# Patient Record
Sex: Female | Born: 1955 | Race: Black or African American | Hispanic: No | Marital: Single | Smoking: Former smoker
Health system: Southern US, Community
[De-identification: ages and names within clinical notes are randomized; demographics above are authoritative.]

## PROBLEM LIST (undated history)

## (undated) DIAGNOSIS — I739 Peripheral vascular disease, unspecified: Secondary | ICD-10-CM

## (undated) DIAGNOSIS — K559 Vascular disorder of intestine, unspecified: Secondary | ICD-10-CM

## (undated) DIAGNOSIS — I10 Essential (primary) hypertension: Secondary | ICD-10-CM

## (undated) HISTORY — DX: Peripheral vascular disease, unspecified: I73.9

## (undated) HISTORY — DX: Essential (primary) hypertension: I10

---

## 2000-06-20 ENCOUNTER — Emergency Department (HOSPITAL_COMMUNITY): Admission: EM | Admit: 2000-06-20 | Discharge: 2000-06-20 | Payer: Self-pay | Admitting: Emergency Medicine

## 2010-11-15 ENCOUNTER — Emergency Department (HOSPITAL_COMMUNITY): Admission: EM | Admit: 2010-11-15 | Payer: Self-pay | Source: Home / Self Care

## 2012-05-23 ENCOUNTER — Emergency Department (HOSPITAL_COMMUNITY)
Admission: EM | Admit: 2012-05-23 | Discharge: 2012-05-23 | Disposition: A | Discharge status: Home / Self Care | Payer: Self-pay | Attending: Emergency Medicine | Admitting: Emergency Medicine

## 2012-05-23 ENCOUNTER — Encounter (HOSPITAL_COMMUNITY): Payer: Self-pay

## 2012-05-23 DIAGNOSIS — X118XXA Contact with other hot tap-water, initial encounter: Secondary | ICD-10-CM | POA: Insufficient documentation

## 2012-05-23 DIAGNOSIS — F1010 Alcohol abuse, uncomplicated: Secondary | ICD-10-CM | POA: Insufficient documentation

## 2012-05-23 DIAGNOSIS — T2122XA Burn of second degree of abdominal wall, initial encounter: Secondary | ICD-10-CM | POA: Insufficient documentation

## 2012-05-23 DIAGNOSIS — T300 Burn of unspecified body region, unspecified degree: Secondary | ICD-10-CM

## 2012-05-23 DIAGNOSIS — F10929 Alcohol use, unspecified with intoxication, unspecified: Secondary | ICD-10-CM

## 2012-05-23 MED ORDER — SILVER SULFADIAZINE 1 % EX CREA: TOPICAL_CREAM | Freq: Every day | CUTANEOUS | Status: AC

## 2012-05-23 MED ORDER — SILVER SULFADIAZINE 1 % EX CREA
TOPICAL_CREAM | Freq: Once | CUTANEOUS | Status: AC
  Administered 2012-05-23: 22:00:00 via TOPICAL
  Filled 2012-05-23: qty 50
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
## 2012-05-23 NOTE — ED Provider Notes (Signed)
Medical screening examination/treatment/procedure(s) were performed by non-physician practitioner and as supervising physician I was immediately available for consultation/collaboration.  Tobin Chad, MD 05/23/12 [phone]
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
## 2015-10-23 ENCOUNTER — Emergency Department (INDEPENDENT_AMBULATORY_CARE_PROVIDER_SITE_OTHER)
Admission: EM | Admit: 2015-10-23 | Discharge: 2015-10-23 | Disposition: A | Discharge status: Home / Self Care | Payer: Self-pay | Source: Home / Self Care | Attending: Family Medicine | Admitting: Family Medicine

## 2015-10-23 ENCOUNTER — Encounter (HOSPITAL_COMMUNITY): Payer: Self-pay | Admitting: *Deleted

## 2015-10-23 DIAGNOSIS — I10 Essential (primary) hypertension: Secondary | ICD-10-CM

## 2015-10-23 LAB — POCT I-STAT, CHEM 8
BUN: 13 mg/dL (ref 6–20)
CHLORIDE: 105 mmol/L (ref 101–111)
CREATININE: 0.5 mg/dL (ref 0.44–1.00)
Calcium, Ion: 1.18 mmol/L (ref 1.12–1.23)
GLUCOSE: 94 mg/dL (ref 65–99)
HCT: 45 % (ref 36.0–46.0)
Hemoglobin: 15.3 g/dL — ABNORMAL HIGH (ref 12.0–15.0)
POTASSIUM: 3.7 mmol/L (ref 3.5–5.1)
Sodium: 140 mmol/L (ref 135–145)
TCO2: 23 mmol/L (ref 0–100)

## 2015-10-23 MED ORDER — LISINOPRIL-HYDROCHLOROTHIAZIDE 10-12.5 MG PO TABS: 1.0000 | ORAL_TABLET | Freq: Every day | ORAL | Status: DC

## 2015-10-23 NOTE — Discharge Instructions (Signed)
Take medicine daily and recheck bp in 2 weeks, have a check if side effect concerns and for refill of medicine.

## 2015-10-23 NOTE — ED Provider Notes (Signed)
CSN: [id number]     Arrival date & time 10/23/15  1408 History   First MD Initiated Contact with Patient 10/23/15 1544     Chief Complaint  Patient presents with  . Medication Refill   (Consider location/radiation/quality/duration/timing/severity/associated sxs/prior Treatment) Patient is a 60 y.o. female presenting with hypertension. The history is provided by the patient.  Hypertension This is a new problem. The current episode started yesterday (going to [facility] and bp has been markedly elevated since 09/2015 and has been told she needs meds, sister on meds for htn, here for eval.). The problem has been gradually worsening. Pertinent negatives include no chest pain and no abdominal pain.    History reviewed. No pertinent past medical history. History reviewed. No pertinent past surgical history. History reviewed. No pertinent family history. Social History  Substance Use Topics  . Smoking status: Current Every Day Smoker -- 1.00 packs/day for 30 years    Types: Cigarettes  . Smokeless tobacco: None  . Alcohol Use: 1.8 oz/week    3 Cans of beer per week   OB History    No data available     Review of Systems  Constitutional: Negative.   Respiratory: Negative.   Cardiovascular: Negative.  Negative for chest pain.  Gastrointestinal: Negative for abdominal pain.  All other systems reviewed and are negative.   Allergies  Review of patient's allergies indicates no known allergies.  Home Medications   Prior to Admission medications   Medication Sig Start Date End Date Taking? Authorizing Provider  lisinopril-hydrochlorothiazide (PRINZIDE,ZESTORETIC) 10-12.5 MG tablet Take 1 tablet by mouth daily. 10/23/15   Linna Hoff, MD   Meds Ordered and Administered this Visit  Medications - No data to display  BP 172/94 mmHg  Pulse 72  Temp(Src) 97.3 F (36.3 C) (Oral)  Resp 12  SpO2 97% No data found.   Physical Exam  Constitutional: She is oriented to person, place,  and time. She appears well-developed and well-nourished. No distress.  Neck: Normal range of motion. Neck supple.  Cardiovascular: Normal rate, normal heart sounds and intact distal pulses.   Pulmonary/Chest: Effort normal and breath sounds normal.  Lymphadenopathy:    She has no cervical adenopathy.  Neurological: She is alert and oriented to person, place, and time.  Skin: Skin is warm and dry.  Nursing note and vitals reviewed.   ED Course  Procedures (including critical care time)  Labs Review Labs Reviewed  POCT I-STAT, CHEM 8 - Abnormal; Notable for the following:    Hemoglobin 15.3 (*)    All other components within normal limits    Imaging Review No results found.   Visual Acuity Review  Right Eye Distance:   Left Eye Distance:   Bilateral Distance:    Right Eye Near:   Left Eye Near:    Bilateral Near:         MDM   1. Essential hypertension        Linna Hoff, MD 10/23/15 [phone]

## 2015-10-23 NOTE — ED Notes (Signed)
Pt  Reports  Has   Had     High  Blood pressure        Recently       While  She  Was  Donating plasma    Sister  Has  htn as  Well        Pt    Sitting upright  On  Exam table  Speaking in  Complete  sentances

## 2018-08-14 ENCOUNTER — Encounter (HOSPITAL_COMMUNITY)
Admission: EM | Disposition: A | Discharge status: Home / Self Care | Payer: Self-pay | Source: Home / Self Care | Attending: Vascular Surgery

## 2018-08-14 ENCOUNTER — Emergency Department (HOSPITAL_COMMUNITY): Payer: Self-pay

## 2018-08-14 ENCOUNTER — Encounter (HOSPITAL_COMMUNITY): Payer: Self-pay | Admitting: Emergency Medicine

## 2018-08-14 ENCOUNTER — Other Ambulatory Visit: Payer: Self-pay

## 2018-08-14 ENCOUNTER — Inpatient Hospital Stay (HOSPITAL_COMMUNITY)
Admission: EM | Admit: 2018-08-14 | Discharge: 2018-08-16 | DRG: 253 | Disposition: A | Discharge status: Home / Self Care | Payer: Self-pay | Attending: Vascular Surgery | Admitting: Vascular Surgery

## 2018-08-14 DIAGNOSIS — I739 Peripheral vascular disease, unspecified: Secondary | ICD-10-CM

## 2018-08-14 DIAGNOSIS — E876 Hypokalemia: Secondary | ICD-10-CM | POA: Diagnosis not present

## 2018-08-14 DIAGNOSIS — Z79899 Other long term (current) drug therapy: Secondary | ICD-10-CM

## 2018-08-14 DIAGNOSIS — I745 Embolism and thrombosis of iliac artery: Secondary | ICD-10-CM | POA: Diagnosis present

## 2018-08-14 DIAGNOSIS — Z419 Encounter for procedure for purposes other than remedying health state, unspecified: Secondary | ICD-10-CM

## 2018-08-14 DIAGNOSIS — I10 Essential (primary) hypertension: Secondary | ICD-10-CM | POA: Diagnosis present

## 2018-08-14 DIAGNOSIS — Z23 Encounter for immunization: Secondary | ICD-10-CM

## 2018-08-14 DIAGNOSIS — I70222 Atherosclerosis of native arteries of extremities with rest pain, left leg: Secondary | ICD-10-CM

## 2018-08-14 DIAGNOSIS — M79605 Pain in left leg: Secondary | ICD-10-CM

## 2018-08-14 DIAGNOSIS — F17210 Nicotine dependence, cigarettes, uncomplicated: Secondary | ICD-10-CM | POA: Diagnosis present

## 2018-08-14 HISTORY — PX: LOWER EXTREMITY ANGIOGRAPHY: CATH118251

## 2018-08-14 LAB — CBC WITH DIFFERENTIAL/PLATELET
ABS IMMATURE GRANULOCYTES: 0.04 10*3/uL (ref 0.00–0.07)
BASOS PCT: 1 %
Basophils Absolute: 0.1 10*3/uL (ref 0.0–0.1)
Eosinophils Absolute: 0 10*3/uL (ref 0.0–0.5)
Eosinophils Relative: 0 %
HCT: 41.3 % (ref 36.0–46.0)
Hemoglobin: 13.4 g/dL (ref 12.0–15.0)
IMMATURE GRANULOCYTES: 0 %
LYMPHS PCT: 23 %
Lymphs Abs: 2.2 10*3/uL (ref 0.7–4.0)
MCH: 33.3 pg (ref 26.0–34.0)
MCHC: 32.4 g/dL (ref 30.0–36.0)
MCV: 102.7 fL — AB (ref 80.0–100.0)
MONO ABS: 0.6 10*3/uL (ref 0.1–1.0)
MONOS PCT: 7 %
NEUTROS ABS: 6.5 10*3/uL (ref 1.7–7.7)
Neutrophils Relative %: 69 %
PLATELETS: 262 10*3/uL (ref 150–400)
RBC: 4.02 MIL/uL (ref 3.87–5.11)
RDW: 12.6 % (ref 11.5–15.5)
WBC: 9.5 10*3/uL (ref 4.0–10.5)
nRBC: 0 % (ref 0.0–0.2)

## 2018-08-14 LAB — COMPREHENSIVE METABOLIC PANEL
ALBUMIN: 3.8 g/dL (ref 3.5–5.0)
ALT: 17 U/L (ref 0–44)
ANION GAP: 13 (ref 5–15)
AST: 31 U/L (ref 15–41)
Alkaline Phosphatase: 70 U/L (ref 38–126)
BILIRUBIN TOTAL: 0.6 mg/dL (ref 0.3–1.2)
BUN: 25 mg/dL — AB (ref 8–23)
CHLORIDE: 95 mmol/L — AB (ref 98–111)
CO2: 25 mmol/L (ref 22–32)
Calcium: 9.3 mg/dL (ref 8.9–10.3)
Creatinine, Ser: 1.08 mg/dL — ABNORMAL HIGH (ref 0.44–1.00)
GFR calc Af Amer: >60 mL/min (ref >60)
GFR calc non Af Amer: 54 mL/min — ABNORMAL LOW (ref >60)
GLUCOSE: 96 mg/dL (ref 70–99)
POTASSIUM: 4 mmol/L (ref 3.5–5.1)
SODIUM: 133 mmol/L — AB (ref 135–145)
TOTAL PROTEIN: 7.1 g/dL (ref 6.5–8.1)

## 2018-08-14 LAB — I-STAT CG4 LACTIC ACID, ED: LACTIC ACID, VENOUS: 1.64 mmol/L (ref 0.5–1.9)

## 2018-08-14 SURGERY — LOWER EXTREMITY ANGIOGRAPHY
Anesthesia: LOCAL

## 2018-08-14 MED ORDER — LABETALOL HCL 5 MG/ML IV SOLN
INTRAVENOUS | Status: AC
  Filled 2018-08-14: qty 4

## 2018-08-14 MED ORDER — IOPAMIDOL (ISOVUE-370) INJECTION 76%
100.0000 mL | Freq: Once | INTRAVENOUS | Status: AC | PRN
  Administered 2018-08-14: 100 mL via INTRAVENOUS

## 2018-08-14 MED ORDER — SODIUM CHLORIDE 0.9 % IV SOLN: 250.0000 mL | INTRAVENOUS | Status: DC | PRN

## 2018-08-14 MED ORDER — FENTANYL CITRATE (PF) 100 MCG/2ML IJ SOLN
INTRAMUSCULAR | Status: DC | PRN
  Administered 2018-08-14: 50 ug via INTRAVENOUS

## 2018-08-14 MED ORDER — HEPARIN (PORCINE) IN NACL 1000-0.9 UT/500ML-% IV SOLN
INTRAVENOUS | Status: AC
  Filled 2018-08-14: qty 500

## 2018-08-14 MED ORDER — FENTANYL CITRATE (PF) 100 MCG/2ML IJ SOLN
INTRAMUSCULAR | Status: AC
  Filled 2018-08-14: qty 2

## 2018-08-14 MED ORDER — LABETALOL HCL 5 MG/ML IV SOLN
10.0000 mg | INTRAVENOUS | Status: DC | PRN
  Administered 2018-08-14 (×2): 10 mg via INTRAVENOUS

## 2018-08-14 MED ORDER — ACETAMINOPHEN 325 MG PO TABS: 650.0000 mg | ORAL_TABLET | ORAL | Status: DC | PRN

## 2018-08-14 MED ORDER — MORPHINE SULFATE (PF) 2 MG/ML IV SOLN
INTRAVENOUS | Status: AC
  Filled 2018-08-14: qty 1

## 2018-08-14 MED ORDER — ASPIRIN EC 81 MG PO TBEC
81.0000 mg | DELAYED_RELEASE_TABLET | Freq: Every day | ORAL | Status: DC
  Administered 2018-08-14 – 2018-08-16 (×3): 81 mg via ORAL
  Filled 2018-08-14 (×3): qty 1

## 2018-08-14 MED ORDER — MORPHINE SULFATE (PF) 10 MG/ML IV SOLN
2.0000 mg | INTRAVENOUS | Status: DC | PRN
  Administered 2018-08-14: 2 mg via INTRAVENOUS

## 2018-08-14 MED ORDER — FENTANYL CITRATE (PF) 100 MCG/2ML IJ SOLN
50.0000 ug | INTRAMUSCULAR | Status: AC | PRN
  Administered 2018-08-14 – 2018-08-15 (×4): 50 ug via INTRAVENOUS
  Filled 2018-08-14 (×4): qty 2

## 2018-08-14 MED ORDER — MORPHINE SULFATE (PF) 2 MG/ML IV SOLN: 2.0000 mg | INTRAVENOUS | Status: DC | PRN

## 2018-08-14 MED ORDER — IODIXANOL 320 MG/ML IV SOLN
INTRAVENOUS | Status: DC | PRN
  Administered 2018-08-14: 97 mL via INTRA_ARTERIAL

## 2018-08-14 MED ORDER — SODIUM CHLORIDE 0.9% FLUSH
3.0000 mL | Freq: Two times a day (BID) | INTRAVENOUS | Status: DC
  Administered 2018-08-14 – 2018-08-15 (×2): 3 mL via INTRAVENOUS

## 2018-08-14 MED ORDER — FENTANYL CITRATE (PF) 100 MCG/2ML IJ SOLN
50.0000 ug | Freq: Once | INTRAMUSCULAR | Status: AC
  Administered 2018-08-14: 50 ug via INTRAVENOUS
  Filled 2018-08-14: qty 2

## 2018-08-14 MED ORDER — HYDRALAZINE HCL 20 MG/ML IJ SOLN
INTRAMUSCULAR | Status: AC
  Filled 2018-08-14: qty 1

## 2018-08-14 MED ORDER — LISINOPRIL 10 MG PO TABS
10.0000 mg | ORAL_TABLET | Freq: Every day | ORAL | Status: DC
  Administered 2018-08-14 – 2018-08-16 (×3): 10 mg via ORAL
  Filled 2018-08-14 (×3): qty 1

## 2018-08-14 MED ORDER — LIDOCAINE HCL (PF) 1 % IJ SOLN
INTRAMUSCULAR | Status: DC | PRN
  Administered 2018-08-14: 15 mL via INTRADERMAL

## 2018-08-14 MED ORDER — HYDROCHLOROTHIAZIDE 12.5 MG PO CAPS
12.5000 mg | ORAL_CAPSULE | Freq: Every day | ORAL | Status: DC
  Administered 2018-08-14 – 2018-08-16 (×3): 12.5 mg via ORAL
  Filled 2018-08-14 (×3): qty 1

## 2018-08-14 MED ORDER — OXYCODONE HCL 5 MG PO TABS
ORAL_TABLET | ORAL | Status: AC
  Filled 2018-08-14: qty 2

## 2018-08-14 MED ORDER — LISINOPRIL-HYDROCHLOROTHIAZIDE 10-12.5 MG PO TABS: 1.0000 | ORAL_TABLET | Freq: Every day | ORAL | Status: DC

## 2018-08-14 MED ORDER — HEPARIN (PORCINE) IN NACL 1000-0.9 UT/500ML-% IV SOLN
INTRAVENOUS | Status: DC | PRN
  Administered 2018-08-14 (×2): 500 mL

## 2018-08-14 MED ORDER — ONDANSETRON HCL 4 MG/2ML IJ SOLN: 4.0000 mg | Freq: Four times a day (QID) | INTRAMUSCULAR | Status: DC | PRN

## 2018-08-14 MED ORDER — SODIUM CHLORIDE 0.9 % IV SOLN
INTRAVENOUS | Status: DC
  Administered 2018-08-14: 12:00:00 via INTRAVENOUS

## 2018-08-14 MED ORDER — LIDOCAINE HCL (PF) 1 % IJ SOLN
INTRAMUSCULAR | Status: AC
  Filled 2018-08-14: qty 30

## 2018-08-14 MED ORDER — MORPHINE SULFATE (PF) 2 MG/ML IV SOLN
2.0000 mg | Freq: Once | INTRAVENOUS | Status: AC
  Administered 2018-08-14: 2 mg via INTRAVENOUS

## 2018-08-14 MED ORDER — MIDAZOLAM HCL 2 MG/2ML IJ SOLN
INTRAMUSCULAR | Status: AC
  Filled 2018-08-14: qty 2

## 2018-08-14 MED ORDER — IOPAMIDOL (ISOVUE-370) INJECTION 76%
INTRAVENOUS | Status: AC
  Filled 2018-08-14: qty 100

## 2018-08-14 MED ORDER — SODIUM CHLORIDE 0.9 % WEIGHT BASED INFUSION: 1.0000 mL/kg/h | INTRAVENOUS | Status: DC

## 2018-08-14 MED ORDER — CEFAZOLIN SODIUM-DEXTROSE 2-4 GM/100ML-% IV SOLN
2.0000 g | INTRAVENOUS | Status: AC
  Administered 2018-08-15: 2 g via INTRAVENOUS
  Filled 2018-08-14: qty 100

## 2018-08-14 MED ORDER — HEPARIN SODIUM (PORCINE) 5000 UNIT/ML IJ SOLN
5000.0000 [IU] | Freq: Three times a day (TID) | INTRAMUSCULAR | Status: DC
  Administered 2018-08-14 – 2018-08-16 (×4): 5000 [IU] via SUBCUTANEOUS
  Filled 2018-08-14 (×5): qty 1

## 2018-08-14 MED ORDER — SODIUM CHLORIDE 0.9% FLUSH: 3.0000 mL | INTRAVENOUS | Status: DC | PRN

## 2018-08-14 MED ORDER — OXYCODONE HCL 5 MG PO TABS
5.0000 mg | ORAL_TABLET | ORAL | Status: DC | PRN
  Administered 2018-08-14 – 2018-08-16 (×5): 10 mg via ORAL
  Filled 2018-08-14 (×4): qty 2

## 2018-08-14 MED ORDER — ROSUVASTATIN CALCIUM 5 MG PO TABS
10.0000 mg | ORAL_TABLET | Freq: Every day | ORAL | Status: DC
  Administered 2018-08-14 – 2018-08-15 (×2): 10 mg via ORAL
  Filled 2018-08-14 (×3): qty 2

## 2018-08-14 MED ORDER — MIDAZOLAM HCL 2 MG/2ML IJ SOLN
INTRAMUSCULAR | Status: DC | PRN
  Administered 2018-08-14: 1 mg via INTRAVENOUS

## 2018-08-14 MED ORDER — HYDRALAZINE HCL 20 MG/ML IJ SOLN
5.0000 mg | INTRAMUSCULAR | Status: DC | PRN
  Administered 2018-08-14: 5 mg via INTRAVENOUS

## 2018-08-14 SURGICAL SUPPLY — 9 items
CATH OMNI FLUSH 5F 65CM (CATHETERS) ×1 IMPLANT
KIT MICROPUNCTURE NIT STIFF (SHEATH) ×1 IMPLANT
KIT PV (KITS) ×3 IMPLANT
SHEATH PINNACLE 5F 10CM (SHEATH) ×1 IMPLANT
SHEATH PROBE COVER 6X72 (BAG) ×1 IMPLANT
SYR MEDRAD MARK V 150ML (SYRINGE) ×3 IMPLANT
TRANSDUCER W/STOPCOCK (MISCELLANEOUS) ×3 IMPLANT
TRAY PV CATH (CUSTOM PROCEDURE TRAY) ×3 IMPLANT
WIRE BENTSON .035X145CM (WIRE) ×1 IMPLANT

## 2018-08-14 NOTE — ED Provider Notes (Signed)
[facility] EMERGENCY DEPARTMENT Provider Note   CSN: [id number] Arrival date & time: 08/14/18  0055     History   Chief Complaint Chief Complaint  Patient presents with  . Leg Pain    HPI Danielle Morton is a 62 y.o. female.  The history is provided by the patient.  She has history of hypertension and comes in complaining of pain in her left leg for the last week.  Pain is getting worse and has gotten to the point where she is having difficulty walking.  She rates pain at 7/10.  She has tried taking Goody powder without relief.  She is having difficulty sleeping because of pain.  She denies any trauma.  Pain starts at her left knee and goes all the way down into her foot but does not radiate proximally.  There is no hip or back pain.  She denies any trauma.  Pain is described as a deep ache.  History reviewed. No pertinent past medical history.  There are no active problems to display for this patient.   History reviewed. No pertinent surgical history.   OB History   None      Home Medications    Prior to Admission medications   Medication Sig Start Date End Date Taking? Authorizing Provider  lisinopril-hydrochlorothiazide (PRINZIDE,ZESTORETIC) 10-12.5 MG tablet Take 1 tablet by mouth daily. 10/23/15   Linna Hoff, MD    Family History No family history on file.  Social History Social History   Tobacco Use  . Smoking status: Current Every Day Smoker    Packs/day: 1.00    Years: 30.00    Pack years: 30.00    Types: Cigarettes  . Smokeless tobacco: Never Used  Substance Use Topics  . Alcohol use: Yes    Alcohol/week: 3.0 standard drinks    Types: 3 Cans of beer per week  . Drug use: No     Allergies   Patient has no known allergies.   Review of Systems Review of Systems  All other systems reviewed and are negative.    Physical Exam Updated Vital Signs BP (!) 172/81 (BP Location: Right Arm)   Pulse (!) 49   Temp 98.5 F (36.9  C) (Oral)   Resp 18   Ht 5\' 5"  (1.651 m)   SpO2 98%   Physical Exam  Nursing note and vitals reviewed.  62 year old female, resting comfortably and in no acute distress. Vital signs are significant for elevated systolic blood pressure and slow pulse. Oxygen saturation is 98%, which is normal. Head is normocephalic and atraumatic. PERRLA, EOMI. Oropharynx is clear. Neck is nontender and supple without adenopathy or JVD. Back is nontender and there is no CVA tenderness.  Straight leg raise is negative. Lungs are clear without rales, wheezes, or rhonchi. Chest is nontender. Heart has regular rate and rhythm without murmur. Abdomen is soft, flat, nontender without masses or hepatosplenomegaly and peristalsis is normoactive. Extremities: Left leg is cold from the knee, distally.  There is mild tenderness to palpation in the left calf but none in the foot.  Dorsalis pedis and posterior tibial pulses are not palpable.  Capillary refill is delayed to 6 seconds.  Toes appears somewhat cyanotic, but also appear mildly cyanotic on the right.  Capillary refill on the right is also delayed to 5 seconds.  Dorsalis pedis and posterior tibial pulses are not palpable on the right, either.  Pulse was checked with the aid of Doppler and I  was able to detect a dorsalis pedis pulse on the right, none on the left.. Skin is warm and dry without rash. Neurologic: Mental status is normal, cranial nerves are intact, there are no motor or sensory deficits.  ED Treatments / Results  Labs (all labs ordered are listed, but only abnormal results are displayed) Labs Reviewed  COMPREHENSIVE METABOLIC PANEL - Abnormal; Notable for the following components:      Result Value   Sodium 133 (*)    Chloride 95 (*)    BUN 25 (*)    Creatinine, Ser 1.08 (*)    GFR calc non Af Amer 54 (*)    All other components within normal limits  CBC WITH DIFFERENTIAL/PLATELET - Abnormal; Notable for the following components:   MCV  102.7 (*)    All other components within normal limits  I-STAT CG4 LACTIC ACID, ED    EKG EKG Interpretation  Date/Time:  Monday August 14 2018 02:28:58 EST Ventricular Rate:  51 PR Interval:    QRS Duration: 96 QT Interval:  470 QTC Calculation: 433 R Axis:   39 Text Interpretation:  Sinus bradycardia Otherwise within normal limits No old tracing to compare Confirmed by Dione Booze ([id number]) on 08/14/2018 2:36:39 AM   Radiology Ct Angio Low Extrem Left W &/or Wo Contrast  Result Date: 08/14/2018 CLINICAL DATA:  Sudden onset of cold left leg.  Pain. EXAM: CT ANGIOGRAPHY OF LEFT LOWER EXTREMITY TECHNIQUE: Multidetector CT imaging of the abdomen, pelvis and left lower extremity was performed using the standard protocol during bolus administration of intravenous contrast. Multiplanar CT image reconstructions and MIPs were obtained to evaluate the vascular anatomy. CONTRAST:  ISOVUE-370 IOPAMIDOL (ISOVUE-370) INJECTION 76% COMPARISON:  None. FINDINGS: VASCULAR Aorta: Infrarenal ectasia, maximal dimension 2.9 cm. Calcified plaque with eccentric mural thrombus causing approximately 50% luminal stenosis just proximal to the IMA. No aortic dissection or periaortic stranding. Celiac: Plaque at the origin causes approximately 50% stenosis. No poststenotic dilatation. No dissection. SMA: Patent with minimal calcified and noncalcified plaque in the midportion. Noncalcified plaque causes mild luminal narrowing just proximal to bifurcation of mesenteric branch vessels. Renals: Mild plaque within the proximal right renal artery causes less than 50% stenosis. The left renal artery is patent. IMA: Occluded at the origin with proximal reconstitution from collaterals. RIGHT Lower Extremity Inflow: Moderate calcified noncalcified plaque of the common and included internal and external iliac arteries. High-grade stenosis of the mid external iliac artery, distal portion not included in the field of view on  this left lower extremity exam. Outflow: Not included in the field of view. Runoff: Not included in the field of view. LEFT Lower Extremity Inflow: Calcified and noncalcified plaque with eccentric mural thrombus causing high-grade greater than 75% stenosis at the left common iliac artery. The internal iliac artery is occluded at the origin with thready distal reconstitution. Mild stenosis at the origin of the external iliac artery with calcified and noncalcified plaque. Outflow: Common femoral artery is occluded just beyond the inguinal ligament spanning approximately 19 mm. There is collateralized flow in the profound femoral artery, which provides dominant blood supply to the lower extremity. Minimal flow in the proximal superficial femoral artery which is otherwise completely occluded from the proximal portion through the popliteal. Collaterals provide minimal distal reconstitution flow in the popliteal artery which is small in caliber. Runoff: Faint 2 vessel runoff through the ankle with flow in the peroneal and anterior tibial artery. Minimal flow in the proximal posterior tibial artery which is  otherwise not visualized. Calf vessels are small in caliber. Veins: Not well assessed on this dedicated arterial phase exam. Review of the MIP images confirms the above findings. NON-VASCULAR Lower chest: Mild cardiomegaly.  Suspected emphysema at the bases. Hepatobiliary: Included portion of the liver is unremarkable. Pancreas: No ductal dilatation or inflammation. Spleen: Normal in size without focal abnormality. Adrenals/Urinary Tract: No adrenal nodule. Right kidney are partially included in the field of view. Left kidney is unremarkable. Urinary bladder is distended without wall thickening. Stomach/Bowel: Descending and sigmoid colon are nondistended and not well assessed. No evidence of bowel inflammation or obstruction, right bowel loops not included in the field of view. Lymphatic: No adenopathy. Reproductive:  Calcified uterine fibroid.  No adnexal mass. Other: No free fluid or free air in the included abdomen or pelvis. Musculoskeletal: Multiple bone infarcts in the distal femur and throughout the tibia. IMPRESSION: VASCULAR 1. Severe vascular disease of the abdominal aorta and left lower extremity. The left superficial femoral artery is near completely occluded throughout its course with minimal flow proximally, some thready distal reconstitution at the popliteal. Profundus femoris is the dominant blood supply to the left lower extremity via collaterals. Thready 2 vessel runoff to the foot with small caliber vessels in the calf. 2. 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 Mild narrowing at the origin of the left internal iliac artery with calcified plaque, which is otherwise patent. Left external iliac artery is occluded at the origin with only minimal distal reconstitution. 3. Ectatic infrarenal aorta with diffuse calcified and noncalcified plaque and large amount of eccentric mural thrombus. 4. Recommend vascular surgery consultation. NON-VASCULAR Nonacute findings as described above. These results were called by telephone at the time of interpretation on 08/14/2018 at 4:16 am to Dr. Dione BoozeAVID Othman Masur , who verbally acknowledged these results. Electronically Signed   By: Narda RutherfordMelanie  Sanford M.D.   On: 08/14/2018 04:23    Procedures Procedures  CRITICAL CARE Performed by: Dione Boozeavid Shi Blankenship Total critical care time: 50 minutes Critical care time was exclusive of separately billable procedures and treating other patients. Critical care was necessary to treat or prevent imminent or life-threatening deterioration. Critical care was time spent personally by me on the following activities: development of treatment plan with patient and/or surrogate as well as nursing, discussions with consultants, evaluation of patient's response to treatment,  examination of patient, obtaining history from patient or surrogate, ordering and performing treatments and interventions, ordering and review of laboratory studies, ordering and review of radiographic studies, pulse oximetry and re-evaluation of patient's condition.  Medications Ordered in ED Medications  iopamidol (ISOVUE-370) 76 % injection (has no administration in time range)  fentaNYL (SUBLIMAZE) injection 50 mcg (50 mcg Intravenous Given 08/14/18 0229)  iopamidol (ISOVUE-370) 76 % injection 100 mL (100 mLs Intravenous Contrast Given 08/14/18 0257)     Initial Impression / Assessment and Plan / ED Course  I have reviewed the triage vital signs and the nursing notes.  Pertinent labs & imaging results that were available during my care of the patient were reviewed by me and considered in my medical decision making (see chart for details).  Left leg pain which appears to be due to peripheral vascular disease.  Unable to detect pulses on the left foot with aid of Doppler.  Old records are reviewed, and she has no relevant past visits.  She will be sent for CT angiogram of the left leg.  CT angiogram  confirms severe peripheral vascular disease.  Lactic acid level is normal.  Case is discussed with Dr. Myra Gianotti of vascular surgery service who agrees to come to evaluate the patient.  Final Clinical Impressions(s) / ED Diagnoses   Final diagnoses:  Peripheral arterial disease (HCC)  Left leg pain    ED Discharge Orders    None       Dione Booze, MD 08/14/18 [phone]

## 2018-08-14 NOTE — ED Triage Notes (Signed)
[facility] [employer] [facility] (pt is [facility]) for c/o left leg pain from her knee to her toes described as pins and needles. Hx: HTN currently taking lisinopril. BP 160/100 P60 RR16 CBG 124

## 2018-08-14 NOTE — Progress Notes (Signed)
Sleeping

## 2018-08-14 NOTE — ED Notes (Signed)
Vascular surgery @ bedside

## 2018-08-14 NOTE — Consult Note (Signed)
[facility] [place]  Patient name: Danielle Morton MRN: [id number] DOB: September 14, 1956 Sex: female   REQUESTING PROVIDER:    ER   REASON FOR CONSULT:    Left leg pain  HISTORY OF PRESENT ILLNESS:   Danielle Morton is a 62 y.o. female, who presented to the emergency department today with a one-week history of left leg pain.  It has progressed to where she can hardly take a few steps before it starts to hurt.  She is tried taking Goody powder for pain relief which has been unsuccessful.  The pain wakes her up at night.  She does not have any open wounds.  The patient is a smoker and managed for hypertension.  PAST MEDICAL HISTORY   Hypertension Tobacco abuse Peripheral vascular disease   FAMILY HISTORY   Negative for premature cardiovascular disease  SOCIAL HISTORY:   Social History   Socioeconomic History  . Marital status: Single    Spouse name: Not on file  . Number of children: Not on file  . Years of education: Not on file  . Highest education level: Not on file  Occupational History  . Not on file  Social Needs  . Financial resource strain: Not on file  . Food insecurity:    Worry: Not on file    Inability: Not on file  . Transportation needs:    Medical: Not on file    Non-medical: Not on file  Tobacco Use  . Smoking status: Current Every Day Smoker    Packs/day: 1.00    Years: 30.00    Pack years: 30.00    Types: Cigarettes  . Smokeless tobacco: Never Used  Substance and Sexual Activity  . Alcohol use: Yes    Alcohol/week: 3.0 standard drinks    Types: 3 Cans of beer per week  . Drug use: No  . Sexual activity: Not on file  Lifestyle  . Physical activity:    Days per week: Not on file    Minutes per session: Not on file  . Stress: Not on file  Relationships  . Social connections:    Talks on phone: Not on file    Gets together: Not on file    Attends religious service: Not on file    Active  member of club or organization: Not on file    Attends meetings of clubs or organizations: Not on file    Relationship status: Not on file  . Intimate partner violence:    Fear of current or ex partner: Not on file    Emotionally abused: Not on file    Physically abused: Not on file    Forced sexual activity: Not on file  Other Topics Concern  . Not on file  Social History Narrative  . Not on file    ALLERGIES:    No Known Allergies  CURRENT MEDICATIONS:    Current Facility-Administered Medications  Medication Dose Route Frequency Provider Last Rate Last Dose  . iopamidol (ISOVUE-370) 76 % injection            Current Outpatient Medications  Medication Sig Dispense Refill  . Aspirin-Acetaminophen (GOODYS BODY PAIN PO) Take 1 packet by mouth daily as needed (pain).    Marland Kitchen. lisinopril-hydrochlorothiazide (PRINZIDE,ZESTORETIC) 10-12.5 MG tablet Take 1 tablet by mouth daily. 30 tablet 1    REVIEW OF SYSTEMS:   [X]  denotes positive finding, [ ]  denotes negative finding Cardiac  Comments:  Chest pain or chest pressure:  Shortness of breath upon exertion:    Short of breath when lying flat:    Irregular heart rhythm:        Vascular    Pain in calf, thigh, or hip brought on by ambulation: x   Pain in feet at night that wakes you up from your sleep:  x   Blood clot in your veins:    Leg swelling:         Pulmonary    Oxygen at home:    Productive cough:     Wheezing:         Neurologic    Sudden weakness in arms or legs:     Sudden numbness in arms or legs:     Sudden onset of difficulty speaking or slurred speech:    Temporary loss of vision in one eye:     Problems with dizziness:         Gastrointestinal    Blood in stool:      Vomited blood:         Genitourinary    Burning when urinating:     Blood in urine:        Psychiatric    Major depression:         Hematologic    Bleeding problems:    Problems with blood clotting too easily:        Skin      Rashes or ulcers:        Constitutional    Fever or chills:     PHYSICAL EXAM:   Vitals:   08/14/18 0530 08/14/18 0600 08/14/18 0630 08/14/18 0730  BP: 128/79 112/77 (!) 144/97 132/73  Pulse: (!) 51 (!) 52 (!) 53 71  Resp: 20 20 19 18   Temp:      TempSrc:      SpO2: 99% 95% 96% 97%  Height:        GENERAL: The patient is a well-nourished female, in no acute distress. The vital signs are documented above. CARDIAC: There is a regular rate and rhythm.  VASCULAR: Palpable femoral pulses.  Monophasic Doppler signal in the posterior tibial and anterior tibial artery on the right.  No audible Doppler signals in the left PULMONARY: Nonlabored respirations ABDOMEN: Soft and non-tender with normal pitched bowel sounds.  MUSCULOSKELETAL: There are no major deformities or cyanosis. NEUROLOGIC: Sensation and motor function are diminished on the left sKIN: There are no ulcers or rashes noted. PSYCHIATRIC: The patient has a normal affect.  STUDIES:   I have reviewed her CT scan with the following findings: 1. Severe vascular disease of the abdominal aorta and left lower extremity. The left superficial femoral artery is near completely occluded throughout its course with minimal flow proximally, some thready distal reconstitution at the popliteal. Profundus femoris is the dominant blood supply to the left lower extremity via collaterals. Thready 2 vessel runoff to the foot with small caliber vessels in the calf. 2. 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 Mild narrowing at the origin of the left internal iliac artery with calcified plaque, which is otherwise patent. Left external iliac artery is occluded at the origin with only minimal distal reconstitution. 3. Ectatic infrarenal aorta with diffuse calcified and noncalcified plaque and large amount of eccentric mural thrombus. 4. Recommend vascular  surgery consultation.  ASSESSMENT and PLAN   Severe lower extremity peripheral vascular disease, bordering on left leg rest pain: The  patient has a CT scan that shows a high-grade stenosis within the left common iliac artery.  There is also a left superficial femoral artery occlusion.  I told the patient that this likely explains her symptoms.  Because of the severity of her pain, I recommended that we proceed with angiography today.  This would be through a right femoral approach, possibly bilateral, with plans for intervention on the left leg.  The risk-benefit of the procedure discussed with the patient.  She will be n.p.o. until the procedure.  She potentially could go home following her procedure.   Durene Cal, MD [facility] [phone] Pager [phone]

## 2018-08-14 NOTE — Progress Notes (Addendum)
Site area: rt groin fa sheath Site Prior to Removal:  Level 0 Pressure Applied For:  20 minutes Manual:   yes Patient Status During Pull:  stable Post Pull Site:  Level 0 Post Pull Instructions Given:  yes Post Pull Pulses Present: rt pt dopplered Dressing Applied:  Gauze and tegaderm Bedrest begins @ 1435 Comments: Knee immobilizer placed on rt leg

## 2018-08-14 NOTE — Progress Notes (Signed)
Continues to sleep. No c/o pain

## 2018-08-14 NOTE — Progress Notes (Signed)
Orthopedic Tech Progress Note Patient Details:  Danielle GleeSharon Morton [birth year] [id number]  Ortho Devices Type of Ortho Device: Knee Immobilizer       [name] 08/14/2018, 3:48 PM

## 2018-08-14 NOTE — Anesthesia Preprocedure Evaluation (Addendum)
Anesthesia Evaluation  Patient identified by MRN, date of birth, ID band Patient awake    Reviewed: Allergy & Precautions, NPO status , Patient's Chart, lab work & pertinent test results  Airway Mallampati: II  TM Distance: >3 FB Neck ROM: Full    Dental  (+) Lower Dentures, Upper Dentures   Pulmonary Current Smoker,    Pulmonary exam normal breath sounds clear to auscultation       Cardiovascular hypertension, Pt. on medications + Peripheral Vascular Disease  Normal cardiovascular exam Rhythm:Regular Rate:Normal  ECG: SR, rate 51   Neuro/Psych negative neurological ROS  negative psych ROS   GI/Hepatic negative GI ROS, Neg liver ROS,   Endo/Other  diabetes  Renal/GU negative Renal ROS     Musculoskeletal negative musculoskeletal ROS (+)   Abdominal   Peds  Hematology negative hematology ROS (+)   Anesthesia Other Findings femoral occlusion  Reproductive/Obstetrics                            Anesthesia Physical Anesthesia Plan  ASA: III  Anesthesia Plan: General   Post-op Pain Management:    Induction: Intravenous  PONV Risk Score and Plan: 2 and Ondansetron, Dexamethasone, Midazolam and Treatment may vary due to age or medical condition  Airway Management Planned: Oral ETT  Additional Equipment:   Intra-op Plan:   Post-operative Plan: Extubation in OR  Informed Consent: I have reviewed the patients History and Physical, chart, labs and discussed the procedure including the risks, benefits and alternatives for the proposed anesthesia with the patient or authorized representative who has indicated his/her understanding and acceptance.   Dental advisory given  Plan Discussed with: CRNA  Anesthesia Plan Comments:        Anesthesia Quick Evaluation

## 2018-08-14 NOTE — ED Notes (Signed)
Patient is resting comfortably. 

## 2018-08-14 NOTE — Progress Notes (Signed)
Patient sitting up, bending rt leg, c/o pain in left leg, screaming at times. Dr. Randie Heinzain made aware during sheath pull

## 2018-08-14 NOTE — H&P (Signed)
   History and Physical Update  The patient was interviewed and re-examined.  The patient's previous History and Physical has been reviewed and is unchanged from Dr. Myra GianottiBrabham evaluation. There are no appreciable signals on the left with adequate pt and peroneal on the right. Plan for aortogram with possible left leg intervention.  Spenser Cong C. Randie Heinzain, MD [facility] [place] Office: [phone] Pager: [phone]   08/14/2018, 1:03 PM

## 2018-08-14 NOTE — Plan of Care (Signed)
  Problem: Education: Goal: Knowledge of General Education information will improve Description Including pain rating scale, medication(s)/side effects and non-pharmacologic comfort measures Outcome: Progressing   

## 2018-08-14 NOTE — ED Notes (Signed)
Pt is resting no signs of distress

## 2018-08-14 NOTE — Op Note (Signed)
    Patient name: Danielle GleeSharon Rolison MRN: [id number] DOB: 07/19/1956 Sex: female  08/14/2018 Pre-operative Diagnosis: Critical left lower extremity ischemia with rest pain Post-operative diagnosis:  Same Surgeon:  Luanna SalkBrandon C. Randie Heinzain, MD Procedure Performed: 1.  Ultrasound-guided cannulation right common femoral artery 2.  Aortogram bilateral lower extremity runoff 3.  Moderate sedation with fentanyl and Versed for 25 minutes  Indications: 62 year old female with out significant previous medical history presents for evaluation of left lower extremity rest pain and numbness.  CT scan demonstrated occlusion of SFA and tight stenosis common femoral artery and common iliac artery proximally on the left.  She is indicated for angiogram possible intervention.  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  Left common iliac artery has greater than 70% stenosis and the hypogastric artery is occluded.  The right hypogastric artery appears patent without flow-limiting stenosis on the right common or external iliac arteries.  The left common femoral artery is occluded the profunda reconstitutes shortly thereafter.  Bilaterally she has SFA occlusions and reconstituted at the knee popliteal artery appears to have three-vessel runoff although this is incompletely evaluated.   Procedure:  The patient was identified in the holding area and taken to room 8.  The patient was then placed supine on the table and prepped and draped in the usual sterile fashion.  A time out was called.  Ultrasound was used to evaluate the right common femoral artery which was noted to be patent.  The area was anesthetized 1% lidocaine and image was saved the permanent record.  This was cannulated directly micropuncture needle followed by wire and sheath.  A Bentson wire placed 12 x 5 French sheath and Omni catheter was placed to level of L1.  Aortogram was performed followed by bilateral lower extremity runoff.  With  the above findings she will be scheduled for left common femoral endarterectomy with retrograde left common iliac artery stenting.  She tolerated procedure well without immediate complication after catheter and wire removed.  Sheath will be pulled in postoperative holding.  Contrast: 97 cc   Geet Hosking C. Randie Heinzain, MD [facility] [place] Office: [phone] Pager: [phone]

## 2018-08-15 ENCOUNTER — Inpatient Hospital Stay (HOSPITAL_COMMUNITY): Payer: Self-pay

## 2018-08-15 ENCOUNTER — Inpatient Hospital Stay (HOSPITAL_COMMUNITY): Payer: Self-pay | Admitting: Anesthesiology

## 2018-08-15 ENCOUNTER — Encounter (HOSPITAL_COMMUNITY): Payer: Self-pay | Admitting: Vascular Surgery

## 2018-08-15 ENCOUNTER — Encounter (HOSPITAL_COMMUNITY)
Admission: EM | Disposition: A | Discharge status: Home / Self Care | Payer: Self-pay | Source: Home / Self Care | Attending: Vascular Surgery

## 2018-08-15 DIAGNOSIS — I739 Peripheral vascular disease, unspecified: Secondary | ICD-10-CM

## 2018-08-15 HISTORY — PX: ENDARTERECTOMY FEMORAL: SHX5804

## 2018-08-15 HISTORY — PX: INSERTION OF ILIAC STENT: SHX6256

## 2018-08-15 LAB — BASIC METABOLIC PANEL
ANION GAP: 11 (ref 5–15)
BUN: 12 mg/dL (ref 8–23)
CALCIUM: 10 mg/dL (ref 8.9–10.3)
CHLORIDE: 96 mmol/L — AB (ref 98–111)
CO2: 27 mmol/L (ref 22–32)
Creatinine, Ser: 0.91 mg/dL (ref 0.44–1.00)
GFR calc non Af Amer: >60 mL/min (ref >60)
Glucose, Bld: 93 mg/dL (ref 70–99)
Potassium: 3.5 mmol/L (ref 3.5–5.1)
Sodium: 134 mmol/L — ABNORMAL LOW (ref 135–145)

## 2018-08-15 LAB — CBC
HCT: 39.4 % (ref 36.0–46.0)
Hemoglobin: 12.7 g/dL (ref 12.0–15.0)
MCH: 32.3 pg (ref 26.0–34.0)
MCHC: 32.2 g/dL (ref 30.0–36.0)
MCV: 100.3 fL — AB (ref 80.0–100.0)
PLATELETS: 327 10*3/uL (ref 150–400)
RBC: 3.93 MIL/uL (ref 3.87–5.11)
RDW: 12.6 % (ref 11.5–15.5)
WBC: 7.3 10*3/uL (ref 4.0–10.5)
nRBC: 0 % (ref 0.0–0.2)

## 2018-08-15 SURGERY — ENDARTERECTOMY, FEMORAL
Anesthesia: General | Site: Abdomen | Laterality: Left

## 2018-08-15 MED ORDER — ROCURONIUM BROMIDE 10 MG/ML (PF) SYRINGE
PREFILLED_SYRINGE | INTRAVENOUS | Status: DC | PRN
  Administered 2018-08-15: 50 mg via INTRAVENOUS

## 2018-08-15 MED ORDER — PROTAMINE SULFATE 10 MG/ML IV SOLN
INTRAVENOUS | Status: DC | PRN
  Administered 2018-08-15: 50 mg via INTRAVENOUS

## 2018-08-15 MED ORDER — FENTANYL CITRATE (PF) 250 MCG/5ML IJ SOLN
INTRAMUSCULAR | Status: AC
  Filled 2018-08-15: qty 5

## 2018-08-15 MED ORDER — GLYCOPYRROLATE PF 0.2 MG/ML IJ SOSY
PREFILLED_SYRINGE | INTRAMUSCULAR | Status: DC | PRN
  Administered 2018-08-15 (×2): .1 mg via INTRAVENOUS

## 2018-08-15 MED ORDER — BISACODYL 5 MG PO TBEC: 5.0000 mg | DELAYED_RELEASE_TABLET | Freq: Every day | ORAL | Status: DC | PRN

## 2018-08-15 MED ORDER — PROPOFOL 10 MG/ML IV BOLUS
INTRAVENOUS | Status: AC
  Filled 2018-08-15: qty 40

## 2018-08-15 MED ORDER — CLOPIDOGREL BISULFATE 75 MG PO TABS
75.0000 mg | ORAL_TABLET | Freq: Every day | ORAL | Status: DC
  Administered 2018-08-16: 75 mg via ORAL
  Filled 2018-08-15: qty 1

## 2018-08-15 MED ORDER — CEFAZOLIN SODIUM-DEXTROSE 2-4 GM/100ML-% IV SOLN
2.0000 g | Freq: Three times a day (TID) | INTRAVENOUS | Status: AC
  Administered 2018-08-15 (×2): 2 g via INTRAVENOUS
  Filled 2018-08-15 (×2): qty 100

## 2018-08-15 MED ORDER — GUAIFENESIN-DM 100-10 MG/5ML PO SYRP: 15.0000 mL | ORAL_SOLUTION | ORAL | Status: DC | PRN

## 2018-08-15 MED ORDER — PROPOFOL 10 MG/ML IV BOLUS
INTRAVENOUS | Status: DC | PRN
  Administered 2018-08-15: 100 mg via INTRAVENOUS

## 2018-08-15 MED ORDER — LIDOCAINE 2% (20 MG/ML) 5 ML SYRINGE
INTRAMUSCULAR | Status: DC | PRN
  Administered 2018-08-15: 60 mg via INTRAVENOUS
  Administered 2018-08-15: 80 mg via INTRAVENOUS

## 2018-08-15 MED ORDER — SODIUM CHLORIDE 0.9 % IV SOLN
INTRAVENOUS | Status: DC
  Administered 2018-08-15 – 2018-08-16 (×3): via INTRAVENOUS

## 2018-08-15 MED ORDER — MIDAZOLAM HCL 2 MG/2ML IJ SOLN
INTRAMUSCULAR | Status: AC
  Filled 2018-08-15: qty 2

## 2018-08-15 MED ORDER — MAGNESIUM SULFATE 2 GM/50ML IV SOLN: 2.0000 g | Freq: Every day | INTRAVENOUS | Status: DC | PRN

## 2018-08-15 MED ORDER — SODIUM CHLORIDE 0.9 % IV SOLN
INTRAVENOUS | Status: DC | PRN
  Administered 2018-08-15: 25 ug/min via INTRAVENOUS

## 2018-08-15 MED ORDER — HEPARIN SODIUM (PORCINE) 1000 UNIT/ML IJ SOLN
INTRAMUSCULAR | Status: DC | PRN
  Administered 2018-08-15: 6000 [IU] via INTRAVENOUS

## 2018-08-15 MED ORDER — DEXAMETHASONE SODIUM PHOSPHATE 10 MG/ML IJ SOLN
INTRAMUSCULAR | Status: DC | PRN
  Administered 2018-08-15: 10 mg via INTRAVENOUS

## 2018-08-15 MED ORDER — INFLUENZA VAC SPLIT QUAD 0.5 ML IM SUSY
0.5000 mL | PREFILLED_SYRINGE | INTRAMUSCULAR | Status: AC
  Administered 2018-08-16: 0.5 mL via INTRAMUSCULAR
  Filled 2018-08-15: qty 0.5

## 2018-08-15 MED ORDER — SUGAMMADEX SODIUM 200 MG/2ML IV SOLN
INTRAVENOUS | Status: DC | PRN
  Administered 2018-08-15: 200 mg via INTRAVENOUS

## 2018-08-15 MED ORDER — LACTATED RINGERS IV SOLN
INTRAVENOUS | Status: DC | PRN
  Administered 2018-08-15 (×2): via INTRAVENOUS

## 2018-08-15 MED ORDER — DOCUSATE SODIUM 100 MG PO CAPS
100.0000 mg | ORAL_CAPSULE | Freq: Every day | ORAL | Status: DC
  Administered 2018-08-16: 100 mg via ORAL
  Filled 2018-08-15: qty 1

## 2018-08-15 MED ORDER — HEMOSTATIC AGENTS (NO CHARGE) OPTIME
TOPICAL | Status: DC | PRN
  Administered 2018-08-15: 1 via TOPICAL

## 2018-08-15 MED ORDER — FENTANYL CITRATE (PF) 250 MCG/5ML IJ SOLN
INTRAMUSCULAR | Status: DC | PRN
  Administered 2018-08-15: 50 ug via INTRAVENOUS
  Administered 2018-08-15: 100 ug via INTRAVENOUS

## 2018-08-15 MED ORDER — ONDANSETRON HCL 4 MG/2ML IJ SOLN
INTRAMUSCULAR | Status: AC
  Filled 2018-08-15: qty 2

## 2018-08-15 MED ORDER — MIDAZOLAM HCL 2 MG/2ML IJ SOLN
INTRAMUSCULAR | Status: DC | PRN
  Administered 2018-08-15: 2 mg via INTRAVENOUS

## 2018-08-15 MED ORDER — HEPARIN SODIUM (PORCINE) 1000 UNIT/ML IJ SOLN
INTRAMUSCULAR | Status: AC
  Filled 2018-08-15: qty 1

## 2018-08-15 MED ORDER — PHENOL 1.4 % MT LIQD: 1.0000 | OROMUCOSAL | Status: DC | PRN

## 2018-08-15 MED ORDER — DEXAMETHASONE SODIUM PHOSPHATE 10 MG/ML IJ SOLN
INTRAMUSCULAR | Status: AC
  Filled 2018-08-15: qty 1

## 2018-08-15 MED ORDER — GLYCOPYRROLATE PF 0.2 MG/ML IJ SOSY
PREFILLED_SYRINGE | INTRAMUSCULAR | Status: AC
  Filled 2018-08-15: qty 1

## 2018-08-15 MED ORDER — CLOPIDOGREL BISULFATE 75 MG PO TABS
300.0000 mg | ORAL_TABLET | Freq: Once | ORAL | Status: AC
  Administered 2018-08-15: 300 mg via ORAL
  Filled 2018-08-15: qty 4

## 2018-08-15 MED ORDER — 0.9 % SODIUM CHLORIDE (POUR BTL) OPTIME
TOPICAL | Status: DC | PRN
  Administered 2018-08-15: 11:00:00
  Administered 2018-08-15: 2000 mL

## 2018-08-15 MED ORDER — SODIUM CHLORIDE 0.9 % IV SOLN: 500.0000 mL | Freq: Once | INTRAVENOUS | Status: DC | PRN

## 2018-08-15 MED ORDER — SODIUM CHLORIDE 0.9 % IV SOLN
INTRAVENOUS | Status: AC
  Filled 2018-08-15: qty 1.2

## 2018-08-15 MED ORDER — POTASSIUM CHLORIDE CRYS ER 20 MEQ PO TBCR
20.0000 meq | EXTENDED_RELEASE_TABLET | Freq: Every day | ORAL | Status: AC | PRN
  Administered 2018-08-16: 40 meq via ORAL
  Filled 2018-08-15: qty 2

## 2018-08-15 MED ORDER — SODIUM CHLORIDE (PF) 0.9 % IJ SOLN
INTRAVENOUS | Status: DC | PRN
  Administered 2018-08-15: 25 mL via INTRAMUSCULAR

## 2018-08-15 MED ORDER — PANTOPRAZOLE SODIUM 40 MG PO TBEC
40.0000 mg | DELAYED_RELEASE_TABLET | Freq: Every day | ORAL | Status: DC
  Administered 2018-08-15 – 2018-08-16 (×2): 40 mg via ORAL
  Filled 2018-08-15 (×2): qty 1

## 2018-08-15 MED ORDER — PHENYLEPHRINE 40 MCG/ML (10ML) SYRINGE FOR IV PUSH (FOR BLOOD PRESSURE SUPPORT)
PREFILLED_SYRINGE | INTRAVENOUS | Status: DC | PRN
  Administered 2018-08-15 (×2): 40 ug via INTRAVENOUS

## 2018-08-15 MED ORDER — SENNOSIDES-DOCUSATE SODIUM 8.6-50 MG PO TABS: 1.0000 | ORAL_TABLET | Freq: Every evening | ORAL | Status: DC | PRN

## 2018-08-15 MED ORDER — SODIUM CHLORIDE 0.9 % IV SOLN
INTRAVENOUS | Status: DC | PRN
  Administered 2018-08-15: 08:00:00

## 2018-08-15 MED ORDER — ALUM & MAG HYDROXIDE-SIMETH 200-200-20 MG/5ML PO SUSP: 15.0000 mL | ORAL | Status: DC | PRN

## 2018-08-15 MED ORDER — ONDANSETRON HCL 4 MG/2ML IJ SOLN
INTRAMUSCULAR | Status: DC | PRN
  Administered 2018-08-15: 4 mg via INTRAVENOUS

## 2018-08-15 MED FILL — Morphine Sulfate Inj 2 MG/ML: INTRAMUSCULAR | Qty: 1 | Status: AC

## 2018-08-15 SURGICAL SUPPLY — 79 items
ADH SKN CLS APL DERMABOND .7 (GAUZE/BANDAGES/DRESSINGS) ×2
BAG BANDED W/RUBBER/TAPE 36X54 (MISCELLANEOUS) ×2 IMPLANT
BAG EQP BAND 135X91 W/RBR TAPE (MISCELLANEOUS)
CANISTER SUCT 3000ML PPV (MISCELLANEOUS) ×4 IMPLANT
CATH BEACON 5 .035 65 KMP TIP (CATHETERS) ×2 IMPLANT
CATH QUICKCROSS SUPP .035X90CM (MICROCATHETER) IMPLANT
CLIP VESOCCLUDE MED 24/CT (CLIP) ×4 IMPLANT
CLIP VESOCCLUDE SM WIDE 24/CT (CLIP) ×4 IMPLANT
COVER DOME SNAP 22 D (MISCELLANEOUS) ×2 IMPLANT
COVER TABLE BACK 60X90 (DRAPES) ×4 IMPLANT
COVER WAND RF STERILE (DRAPES) ×4 IMPLANT
DERMABOND ADVANCED (GAUZE/BANDAGES/DRESSINGS) ×2
DERMABOND ADVANCED .7 DNX12 (GAUZE/BANDAGES/DRESSINGS) ×2 IMPLANT
DEVICE INFLATION ENCORE 26 (MISCELLANEOUS) ×2 IMPLANT
DEVICE TORQUE KENDALL .025-038 (MISCELLANEOUS) IMPLANT
DRAIN CHANNEL 15F RND FF W/TCR (WOUND CARE) IMPLANT
DRAPE C-ARM 42X72 X-RAY (DRAPES) ×4 IMPLANT
DRAPE FEMORAL ANGIO 80X135IN (DRAPES) IMPLANT
ELECT REM PT RETURN 9FT ADLT (ELECTROSURGICAL) ×4
ELECTRODE REM PT RTRN 9FT ADLT (ELECTROSURGICAL) ×2 IMPLANT
EVACUATOR SILICONE 100CC (DRAIN) IMPLANT
GAUZE 4X4 16PLY RFD (DISPOSABLE) IMPLANT
GLIDEWIRE ADV .035X260CM (WIRE) ×2 IMPLANT
GLOVE BIO SURGEON STRL SZ 6.5 (GLOVE) ×1 IMPLANT
GLOVE BIO SURGEON STRL SZ7 (GLOVE) ×2 IMPLANT
GLOVE BIO SURGEON STRL SZ7.5 (GLOVE) ×6 IMPLANT
GLOVE BIO SURGEONS STRL SZ 6.5 (GLOVE) ×1
GLOVE BIOGEL PI IND STRL 6.5 (GLOVE) IMPLANT
GLOVE BIOGEL PI IND STRL 7.5 (GLOVE) IMPLANT
GLOVE BIOGEL PI INDICATOR 6.5 (GLOVE) ×4
GLOVE BIOGEL PI INDICATOR 7.5 (GLOVE) ×4
GLOVE ECLIPSE 6.5 STRL STRAW (GLOVE) ×2 IMPLANT
GLOVE ECLIPSE 7.0 STRL STRAW (GLOVE) ×2 IMPLANT
GLOVE INDICATOR 7.5 STRL GRN (GLOVE) ×2 IMPLANT
GOWN STRL REUS W/ TWL LRG LVL3 (GOWN DISPOSABLE) ×4 IMPLANT
GOWN STRL REUS W/ TWL XL LVL3 (GOWN DISPOSABLE) ×2 IMPLANT
GOWN STRL REUS W/TWL LRG LVL3 (GOWN DISPOSABLE) ×8
GOWN STRL REUS W/TWL XL LVL3 (GOWN DISPOSABLE) ×4
GUIDEWIRE ANGLED .035X150CM (WIRE) IMPLANT
KIT BASIN OR (CUSTOM PROCEDURE TRAY) ×4 IMPLANT
KIT ENCORE 26 ADVANTAGE (KITS) IMPLANT
KIT TURNOVER KIT B (KITS) ×4 IMPLANT
NDL PERC 18GX7CM (NEEDLE) IMPLANT
NEEDLE PERC 18GX7CM (NEEDLE) ×4 IMPLANT
NS IRRIG 1000ML POUR BTL (IV SOLUTION) ×8 IMPLANT
PACK PERIPHERAL VASCULAR (CUSTOM PROCEDURE TRAY) ×4 IMPLANT
PAD ARMBOARD 7.5X6 YLW CONV (MISCELLANEOUS) ×8 IMPLANT
PATCH VASC XENOSURE 1CMX6CM (Vascular Products) ×4 IMPLANT
PATCH VASC XENOSURE 1X6 (Vascular Products) IMPLANT
SET MICROPUNCTURE 5F STIFF (MISCELLANEOUS) IMPLANT
SHEATH AVANTI 11CM 5FR (SHEATH) ×2 IMPLANT
SHEATH AVANTI 11CM 8FR (SHEATH) IMPLANT
SHEATH BRITE TIP 7FR 35CM (SHEATH) ×2 IMPLANT
SPONGE INTESTINAL PEANUT (DISPOSABLE) ×2 IMPLANT
STENT VIABAHN VBX 7X59X135 (Permanent Stent) ×2 IMPLANT
STENT VIABAHNBX 8X29X135 (Permanent Stent) ×2 IMPLANT
STOPCOCK 4 WAY LG BORE MALE ST (IV SETS) ×2 IMPLANT
STOPCOCK MORSE 400PSI 3WAY (MISCELLANEOUS) IMPLANT
SUT ETHILON 3 0 PS 1 (SUTURE) IMPLANT
SUT MNCRL AB 4-0 PS2 18 (SUTURE) ×4 IMPLANT
SUT PROLENE 5 0 C 1 24 (SUTURE) ×8 IMPLANT
SUT PROLENE 6 0 BV (SUTURE) ×2 IMPLANT
SUT PROLENE 6 0 CC (SUTURE) IMPLANT
SUT VIC AB 2-0 CT1 27 (SUTURE) ×4
SUT VIC AB 2-0 CT1 TAPERPNT 27 (SUTURE) ×2 IMPLANT
SUT VIC AB 2-0 CTX 36 (SUTURE) IMPLANT
SUT VIC AB 3-0 SH 27 (SUTURE) ×4
SUT VIC AB 3-0 SH 27X BRD (SUTURE) ×2 IMPLANT
SUT VICRYL 4-0 PS2 18IN ABS (SUTURE) IMPLANT
SYR 10ML LL (SYRINGE) ×8 IMPLANT
SYR 20CC LL (SYRINGE) ×4 IMPLANT
SYR MEDRAD MARK V 150ML (SYRINGE) IMPLANT
TOWEL GREEN STERILE (TOWEL DISPOSABLE) ×8 IMPLANT
TOWEL GREEN STERILE FF (TOWEL DISPOSABLE) ×4 IMPLANT
TUBING EXTENTION W/L.L. (IV SETS) ×4 IMPLANT
TUBING HIGH PRESSURE 120CM (CONNECTOR) IMPLANT
UNDERPAD 30X30 (UNDERPADS AND DIAPERS) ×4 IMPLANT
WATER STERILE IRR 1000ML POUR (IV SOLUTION) ×4 IMPLANT
WIRE BENTSON .035X145CM (WIRE) ×4 IMPLANT

## 2018-08-15 NOTE — Progress Notes (Signed)
ABI/TBI exam completed. Bilateral lower extremities arterial waveforms demonstrate either absent and/or abnormal patterns. ABI/TBI unable to obtain.  Great toes also with absent waveforms bilaterally.  Kashawn Dirr H Seher Schlagel(RDMS RVT) 08/15/18 2:02 PM

## 2018-08-15 NOTE — Op Note (Signed)
Patient name: Danielle Morton MRN: [id number] DOB: 1955-09-26 Sex: female  08/15/2018 Pre-operative Diagnosis: critical left lower extremity ischemia Post-operative diagnosis:  Same Surgeon:  Apolinar Junes C. Randie Heinz, MD Assistant: Clinton Gallant, PA Procedure Performed: 1.  Left common femoral and profunda femoral endarterectomy with bovine pericardial patch angioplasty 2.  Stent of left common iliac artery with 8x51mm and 7x20mm vbx distally  Indications: 62 year old female with left lower extremity rest pain and ABI of 0 angiogram demonstrated tight stenosis left common iliac artery and occlusion of her left common femoral artery.  She is indicated for femoral endarterectomy stent of her left common iliac artery.  Findings: The left common femoral artery was occluded with calcium and subacute thrombus extending down into the profunda femoris artery.  We did have good endpoint and found in a good signal at completion.  Inflow was strong after stenting of a greater than 80% stenosis of her left common iliac artery which was an 8 mm stent proximally extended with 7 mm stent distally.  At completion she had a strong peroneal signal at the ankle.   Procedure:  The patient was identified in the holding area and taken to the operating room where she was placed supine operative table general anesthesia was induced she was sterilely prepped and draped the left lower extremity and annulus were administered.  We began with timeout.  Large 2 incision was made over the weakly palpable common femoral pulse we dissected down placed a vessel loop around the external iliac artery were soft.  We dissected out the profunda placed Vesseloops around branches there as well as the superficial femoral artery.  Patient was fully heparinized.  We clamped the outflow followed by the inflow open the common femoral artery longitudinally extended down onto the profunda.  We did find a good distal endpoint as well as the proximal  endpoint and the external iliac artery.  Endarterectomy was performed.  There was some feathering proximally and distally.  Inflow was actually quite strong despite the common iliac artery stenosis that was known.  Bovine pericardial patch was trimmed to size sewn in place with 5-0 Prolene suture.  Prior to completion anastomosis we allowed flushing all directions and irrigated with heparinized saline.  Upon completion we had good outflow by the profunda Doppler.  We then cannulated the patch with 18-gauge needle placed a Bentson wire followed a 7 French sheath.  We used a Glidewire advantage and bare catheter to cross into the aorta confirmed intraluminal access.  We then placed the sheath past the blockage brought a 7 x 59 mm stent in place and deployed this.  Completion angiogram demonstrated one area approximately we need to extend up and we placed an 8 x 29 to extend.  Completion of demonstrated no residual stenosis and no dissection.  There is a much stronger pulse in the common femoral artery.  We performed angiogram with an angled view of the culture which demonstrated good flow into our profunda.  Satisfied with this we removed our patch oversewed the arteriotomy with a 5-0 Prolene suture in figure-of-eight fashion.  We had again good Doppler in the profunda and multiphasic sounding peroneal signal at the ankle that was nonexistent prior.  50 mg of protamine was administered we obtained hemostasis irrigated closed in layers with Vicryl and Monocryl.  Dermabond was placed in the skin.  She was then awakened from anesthesia having tolerated procedure well without immediate complication.  All counts were correct at completion.  Contrast: 25 cc  EBL: 100 cc    C. Randie Heinzain, MD [facility] [place] Office: [phone] Pager: [phone]

## 2018-08-15 NOTE — Plan of Care (Signed)
  Problem: Education: Goal: Knowledge of General Education information will improve Description Including pain rating scale, medication(s)/side effects and non-pharmacologic comfort measures Outcome: Progressing   Problem: Health Behavior/Discharge Planning: Goal: Ability to manage health-related needs will improve Outcome: Progressing   

## 2018-08-15 NOTE — Anesthesia Postprocedure Evaluation (Signed)
Anesthesia Post Note  Patient: Danielle Morton  Procedure(s) Performed: ENDARTERECTOMY COMMON FEMORAL (Left ) INSERTION OF ILIAC STENT RETROGRADE (Left Abdomen)     Patient location during evaluation: PACU Anesthesia Type: General Level of consciousness: awake and alert Pain management: pain level controlled Vital Signs Assessment: post-procedure vital signs reviewed and stable Respiratory status: spontaneous breathing, nonlabored ventilation, respiratory function stable and patient connected to nasal cannula oxygen Cardiovascular status: blood pressure returned to baseline and stable Postop Assessment: no apparent nausea or vomiting Anesthetic complications: no    Last Vitals:  Vitals:   08/15/18 1142 08/15/18 1203  BP: 116/78 116/78  Pulse:  65  Resp:  20  Temp:  36.7 C  SpO2:  96%    Last Pain:  Vitals:   08/15/18 1024  TempSrc: Oral  PainSc: 0-No pain                 Ryan P Ellender

## 2018-08-15 NOTE — Progress Notes (Signed)
  Progress Note    08/15/2018 7:10 AM Day of Surgery  Subjective:  Still having left leg pain  Vitals:   08/14/18 2010 08/15/18 0417  BP: (!) 116/93 (!) 144/88  Pulse: 91 (!) 56  Resp: 15 14  Temp: 97.9 F (36.6 C) 98.2 F (36.8 C)  SpO2: 97% 96%    Physical Exam: aaox3 Non labored respirations Left foot is cool Weak left femoral pulse Right groin without hematoma  CBC    Component Value Date/Time   WBC 7.3 08/15/2018 0337   RBC 3.93 08/15/2018 0337   HGB 12.7 08/15/2018 0337   HCT 39.4 08/15/2018 0337   PLT 327 08/15/2018 0337   MCV 100.3 (H) 08/15/2018 0337   MCH 32.3 08/15/2018 0337   MCHC 32.2 08/15/2018 0337   RDW 12.6 08/15/2018 0337   LYMPHSABS 2.2 08/14/2018 0207   MONOABS 0.6 08/14/2018 0207   EOSABS 0.0 08/14/2018 0207   BASOSABS 0.1 08/14/2018 0207    BMET    Component Value Date/Time   NA 134 (L) 08/15/2018 0337   K 3.5 08/15/2018 0337   CL 96 (L) 08/15/2018 0337   CO2 27 08/15/2018 0337   GLUCOSE 93 08/15/2018 0337   BUN 12 08/15/2018 0337   CREATININE 0.91 08/15/2018 0337   CALCIUM 10.0 08/15/2018 0337   GFRNONAA >60 08/15/2018 0337   GFRAA >60 08/15/2018 0337    INR No results found for: INR   Intake/Output Summary (Last 24 hours) at 08/15/2018 0710 Last data filed at 08/15/2018 0039 Gross per 24 hour  Intake 84.05 ml  Output 250 ml  Net -165.95 ml     Assessment:  62 y.o. female is here with critical left lower extremity ischemia  Plan: OR today for left fem endart and retrograde stent on the left   Lastacia Solum C. Randie Heinzain, MD [facility] [place] Office: [phone] Pager: [phone]  08/15/2018 7:10 AM

## 2018-08-15 NOTE — Evaluation (Addendum)
Physical Therapy Evaluation & Discharge Patient Details Name: Rosemary Mossbarger MRN: [id number] DOB: 1956-07-15 Today's Date: 08/15/2018   History of Present Illness  Pt is a 62 y.o. female admitted 08/14/18 with LLE weakness/numbness. Now s/p L femoral endarterectomy with angioplasty 11/26. ABI/TBI exam BLE arterial waveforms demonstrate either absent and/or abnormal patterns. PMH includes tobacco abuse, HTN, PVD; pt homeless.    Clinical Impression  Patient evaluated by Physical Therapy with no further acute PT needs identified. PTA, pt indep; her and husband are homeless; pt plans to discharge to son's home or a shelter. Today, pt mod indep with RW. Decreased BLE light touch sensation; educ on RW use and importance of checking bilateral feet daily due to impaired sensation. Pt would benefit from rollator use for added stability and energy conservation. All education has been completed and the patient has no further questions. See below for  equipment needs. PT is signing off. Thank you for this referral.    Follow Up Recommendations No PT follow up;Supervision - Intermittent    Equipment Recommendations  Rollator  Recommendations for Other Services       Precautions / Restrictions Precautions Precautions: Fall Restrictions Weight Bearing Restrictions: No      Mobility  Bed Mobility               General bed mobility comments: Pt seated in recliner; per NT, indep with bed mobility  Transfers Overall transfer level: Modified independent Equipment used: Rolling walker (2 wheeled) Transfers: Sit to/from Stand Sit to Stand: Supervision Stand pivot transfers: Supervision          Ambulation/Gait Ambulation/Gait assistance: Modified independent (Device/Increase time) Gait Distance (Feet): 40 Feet Assistive device: Rolling walker (2 wheeled) Gait Pattern/deviations: Step-to pattern;Step-through pattern;Antalgic Gait velocity: Decreased Gait velocity interpretation: 1.31  - 2.62 ft/sec, indicative of limited community ambulator General Gait Details: Pt only requiring assist for IV pole/lines. Cues to roll RW versus picking it up with each step; speed and gait pattern improved with this improved with this. Pt declining further distance secondary to fatigue  Stairs Stairs: (Pt declined; educ on technique since LLE is more painful)          Wheelchair Mobility    Modified Rankin (Stroke Patients Only)       Balance Overall balance assessment: Needs assistance   Sitting balance-Leahy Scale: Good       Standing balance-Leahy Scale: Fair Standing balance comment: Can static stand and take steps without UE support                             Pertinent Vitals/Pain Pain Assessment: No/denies pain    Home Living Family/patient expects to be discharged to:: Shelter/Homeless Living Arrangements: Spouse/significant other               Additional Comments: Pt and husband are homeless. Pt reports they plan to discharge to son's home or a shelter    Prior Function Level of Independence: Independent         Comments: Indep with ambulation and ADLs; homeless; does not work     Higher education careers adviser   Dominant Hand: Right    Extremity/Trunk Assessment   Upper Extremity Assessment Upper Extremity Assessment: Overall WFL for tasks assessed    Lower Extremity Assessment Lower Extremity Assessment: LLE deficits/detail;RLE deficits/detail RLE Deficits / Details: Strength WFL; decreased light touch to bilateral feet (L>R) RLE Sensation: decreased light touch LLE Deficits / Details: Strength WFL; decreased  light touch to bilateral feet (L>R) LLE Sensation: decreased light touch    Cervical / Trunk Assessment Cervical / Trunk Assessment: Normal  Communication   Communication: No difficulties  Cognition Arousal/Alertness: Awake/alert Behavior During Therapy: WFL for tasks assessed/performed Overall Cognitive Status: Within  Functional Limits for tasks assessed                                 General Comments: WFL for simple tasks. Seems internally distracted; poor insight into deficits      General Comments General comments (skin integrity, edema, etc.): Husband present    Exercises     Assessment/Plan    PT Assessment Patent does not need any further PT services  PT Problem List         PT Treatment Interventions      PT Goals (Current goals can be found in the Care Plan section)  Acute Rehab PT Goals Patient Stated Goal: "to find somewhere to live" PT Goal Formulation: With patient Time For Goal Achievement: 08/29/18 Potential to Achieve Goals: Fair    Frequency     Barriers to discharge        Co-evaluation               AM-PAC PT "6 Clicks" Mobility  Outcome Measure Help needed turning from your back to your side while in a flat bed without using bedrails?: None Help needed moving from lying on your back to sitting on the side of a flat bed without using bedrails?: None Help needed moving to and from a bed to a chair (including a wheelchair)?: None Help needed standing up from a chair using your arms (e.g., wheelchair or bedside chair)?: None Help needed to walk in hospital room?: None Help needed climbing 3-5 steps with a railing? : A Little 6 Click Score: 23    End of Session   Activity Tolerance: Patient tolerated treatment well;Patient limited by fatigue Patient left: in chair;with call bell/phone within reach;with family/visitor present Nurse Communication: Mobility status PT Visit Diagnosis: Other abnormalities of gait and mobility (R26.89)    Time: [id number] PT Time Calculation (min) (ACUTE ONLY): 10 min   Charges:   PT Evaluation $PT Eval Moderate Complexity: 1 Mod        Ina HomesJaclyn Markela Wee, PT, DPT Acute Rehabilitation Services  Pager [phone] Office [phone]  Malachy ChamberJaclyn L Khamila Bassinger 08/15/2018, 4:31 PM

## 2018-08-15 NOTE — Evaluation (Signed)
Occupational Therapy Evaluation Patient Details Name: Danielle Morton MRN: [id number] DOB: [birth year] Today's Date: 08/15/2018    History of Present Illness Ms. Emanii Bugbee is a 62yo F admitted with L LE weakness/numbness. PMH significant for tobacco abuse, HTN, and PVD   Clinical Impression   Pt admitted with above dx and now presenting with LLE numbness. Pt presenting near baseline levels with slight balance deficits impacting her functional mobility during ADLs. Edu provided to pt re poor sensation in LLE and skin checks to maintain integrity. No OT follow up required upon d/c.     Follow Up Recommendations  No OT follow up    Equipment Recommendations  None recommended by OT    Recommendations for Other Services PT consult     Precautions / Restrictions Precautions Precautions: Fall Restrictions Weight Bearing Restrictions: No      Mobility Bed Mobility               General bed mobility comments: Pt OOB upon entry, NT reporting no physical assist provided  Transfers Overall transfer level: Needs assistance Equipment used: Rolling walker (2 wheeled) Transfers: Sit to/from [employer] Sit to Stand: Supervision Stand pivot transfers: Supervision            Balance Overall balance assessment: Mild deficits observed, not formally tested                                         ADL either performed or assessed with clinical judgement   ADL Overall ADL's : Needs assistance/impaired Eating/Feeding: Independent   Grooming: Independent   Upper Body Bathing: Modified independent;Sitting   Lower Body Bathing: Supervison/ safety;Sit to/from stand   Upper Body Dressing : Modified independent;Sitting   Lower Body Dressing: Supervision/safety;Sit to/from stand   Toilet Transfer: Supervision/safety;Ambulation   Toileting- Clothing Manipulation and Hygiene: Supervision/safety;Sit to/from stand       Functional mobility  during ADLs: Supervision/safety;Rolling walker       Vision Patient Visual Report: No change from baseline              Pertinent Vitals/Pain Pain Assessment: No/denies pain     Hand Dominance Right   Extremity/Trunk Assessment Upper Extremity Assessment Upper Extremity Assessment: Overall WFL for tasks assessed   Lower Extremity Assessment Lower Extremity Assessment: LLE deficits/detail LLE Sensation: decreased light touch   Cervical / Trunk Assessment Cervical / Trunk Assessment: Normal   Communication Communication Communication: No difficulties   Cognition Arousal/Alertness: Awake/alert Behavior During Therapy: WFL for tasks assessed/performed Overall Cognitive Status: Within Functional Limits for tasks assessed                                       Home Living Family/patient expects to be discharged to:: Other (Comment)(Homeless shelter vs. home with family)                                        Prior Functioning/Environment Level of Independence: Independent        Comments: Pt reports she was independent with all ADLs, was homeless and not working        OT Problem List: Impaired sensation;Decreased knowledge of use of DME or AE;Decreased knowledge of precautions;Impaired balance (sitting  and/or standing);Decreased activity tolerance      OT Treatment/Interventions: Self-care/ADL training;Balance training;Patient/family education;Therapeutic activities;Therapeutic exercise;Energy conservation    OT Goals(Current goals can be found in the care plan section) Acute Rehab OT Goals Patient Stated Goal: "to find somewhere to live" OT Goal Formulation: With patient Time For Goal Achievement: 08/27/18 Potential to Achieve Goals: Good  OT Frequency: Min 2X/week   Barriers to D/C: Other (comment)(pt is homeless, unclear d/c)             AM-PAC OT "6 Clicks" Daily Activity     Outcome Measure Help from another person  eating meals?: None Help from another person taking care of personal grooming?: None Help from another person toileting, which includes using toliet, bedpan, or urinal?: A Little Help from another person bathing (including washing, rinsing, drying)?: A Little Help from another person to put on and taking off regular upper body clothing?: None Help from another person to put on and taking off regular lower body clothing?: A Little 6 Click Score: 21   End of Session Equipment Utilized During Treatment: Rolling walker Nurse Communication: Mobility status  Activity Tolerance: Patient tolerated treatment well Patient left: in chair;with call bell/phone within reach;with family/visitor present  OT Visit Diagnosis: Unsteadiness on feet (R26.81)                Time: [id number] OT Time Calculation (min): 14 min Charges:  OT General Charges $OT Visit: 1 Visit OT Evaluation $OT Eval Low Complexity: 1 Low  Crissie ReeseSandra H Lyvers OTR/L 08/15/2018, 3:50 PM

## 2018-08-15 NOTE — Anesthesia Procedure Notes (Signed)
Procedure Name: Intubation Date/Time: 08/15/2018 7:39 AM Performed by: Valda Favia, CRNA Pre-anesthesia Checklist: Patient identified, Emergency Drugs available, Suction available and Patient being monitored Patient Re-evaluated:Patient Re-evaluated prior to induction Oxygen Delivery Method: Circle System Utilized Preoxygenation: Pre-oxygenation with 100% oxygen Induction Type: IV induction Ventilation: Mask ventilation without difficulty Laryngoscope Size: Mac and 4 Grade View: Grade I Tube type: Oral Tube size: 7.0 mm Number of attempts: 1 Airway Equipment and Method: Stylet and Oral airway Placement Confirmation: ETT inserted through vocal cords under direct vision,  positive ETCO2 and breath sounds checked- equal and bilateral Secured at: 20 cm Tube secured with: Tape Dental Injury: Teeth and Oropharynx as per pre-operative assessment

## 2018-08-15 NOTE — Transfer of Care (Signed)
Immediate Anesthesia Transfer of Care Note  Patient: Danielle Morton  Procedure(s) Performed: ENDARTERECTOMY COMMON FEMORAL (Left ) INSERTION OF ILIAC STENT RETROGRADE (Left Abdomen)  Patient Location: PACU  Anesthesia Type:General  Level of Consciousness: awake, alert  and oriented  Airway & Oxygen Therapy: Patient Spontanous Breathing and Patient connected to nasal cannula oxygen  Post-op Assessment: Report given to RN and Post -op Vital signs reviewed and stable  Post vital signs: Reviewed and stable  Last Vitals:  Vitals Value Taken Time  BP 148/86 08/15/2018  9:50 AM  Temp    Pulse 82 08/15/2018  9:59 AM  Resp 21 08/15/2018  9:59 AM  SpO2 95 % 08/15/2018  9:59 AM  Vitals shown include unvalidated device data.  Last Pain:  Vitals:   08/15/18 0417  TempSrc: Oral  PainSc:          Complications: No apparent anesthesia complications

## 2018-08-16 ENCOUNTER — Telehealth: Payer: Self-pay | Admitting: Vascular Surgery

## 2018-08-16 ENCOUNTER — Encounter (HOSPITAL_COMMUNITY): Payer: Self-pay | Admitting: Vascular Surgery

## 2018-08-16 LAB — BASIC METABOLIC PANEL
ANION GAP: 8 (ref 5–15)
BUN: 10 mg/dL (ref 8–23)
CO2: 26 mmol/L (ref 22–32)
Calcium: 8.5 mg/dL — ABNORMAL LOW (ref 8.9–10.3)
Chloride: 102 mmol/L (ref 98–111)
Creatinine, Ser: 1.05 mg/dL — ABNORMAL HIGH (ref 0.44–1.00)
GFR, EST NON AFRICAN AMERICAN: 57 mL/min — AB (ref >60)
GLUCOSE: 106 mg/dL — AB (ref 70–99)
Potassium: 2.9 mmol/L — ABNORMAL LOW (ref 3.5–5.1)
SODIUM: 136 mmol/L (ref 135–145)

## 2018-08-16 LAB — CBC
HCT: 31.8 % — ABNORMAL LOW (ref 36.0–46.0)
Hemoglobin: 10.4 g/dL — ABNORMAL LOW (ref 12.0–15.0)
MCH: 33.2 pg (ref 26.0–34.0)
MCHC: 32.7 g/dL (ref 30.0–36.0)
MCV: 101.6 fL — AB (ref 80.0–100.0)
PLATELETS: 243 10*3/uL (ref 150–400)
RBC: 3.13 MIL/uL — AB (ref 3.87–5.11)
RDW: 12.6 % (ref 11.5–15.5)
WBC: 9.8 10*3/uL (ref 4.0–10.5)
nRBC: 0 % (ref 0.0–0.2)

## 2018-08-16 MED ORDER — OXYCODONE HCL 5 MG PO TABS: 5.0000 mg | ORAL_TABLET | Freq: Four times a day (QID) | ORAL | 0 refills | Status: DC | PRN

## 2018-08-16 MED ORDER — CLOPIDOGREL BISULFATE 75 MG PO TABS: 75.0000 mg | ORAL_TABLET | Freq: Every day | ORAL | 11 refills | Status: DC

## 2018-08-16 MED ORDER — ASPIRIN 81 MG PO TBEC: 81.0000 mg | DELAYED_RELEASE_TABLET | Freq: Every day | ORAL | 11 refills | Status: DC

## 2018-08-16 MED ORDER — ROSUVASTATIN CALCIUM 10 MG PO TABS: 10.0000 mg | ORAL_TABLET | Freq: Every day | ORAL | 11 refills | Status: DC

## 2018-08-16 MED FILL — ROSUVASTATIN CALCIUM 10 MG: 10 | 30 days supply | Qty: 30 | Fill #0

## 2018-08-16 MED FILL — ASPIRIN LOW DOSE 81 MG TBEC: 81 | 30 days supply | Qty: 30 | Fill #0

## 2018-08-16 MED FILL — CLOPIDOGREL 75 MG TABLET: 75 | 30 days supply | Qty: 30 | Fill #0

## 2018-08-16 NOTE — Clinical Social Work Note (Addendum)
Clinical Social Work Assessment  Patient Details  Name: Danielle Morton MRN: [id number] Date of Birth: 09/09/1956  Date of referral:  08/16/18               Reason for consult:  Housing Concerns/Homelessness, [employer]                Permission sought to share information with:    Permission granted to share information::  Yes, Verbal Permission Granted  Name::     Danielle Morton and her son Danielle Morton   Agency::     Relationship::  Sister   Contact Information:  sister [phone],  her son# [phone]  Housing/Transportation Living arrangements for the past 2 months:  Homeless Source of Information:  Patient Patient Interpreter Needed:  None Criminal Activity/Legal Involvement Pertinent to Current Situation/Hospitalization:  No - Comment as needed Significant Relationships:  Adult Children, Siblings, Other Family Members Lives with:  Other (Comment)(patient states she is homeless) Do you feel safe going back to the place where you live?  Yes Need for family participation in patient care:  Yes (Comment)  Care giving concerns: CSW received consult for discharge needs. Patient reports she is homeless. Patient states she can stay her twin sister, niece or son  temporarily once she is discharged.     Social Worker assessment / plan:  CSW spoke with patient at bedside. She was alert and oriented. CSW inquired about housing status. Patient states has been homeless for 3 month. Patient states she lives on the streets and in the [place]. [facility]. Patient states she applied for  Medicaid but was denied. She has appointment next month with IRS to get verification of her income so she can apply for the [employer]( medical insurance). Patient is aware and utilizes the resources in the community.   Employment status:  Unemployed Health and safety inspectornsurance information:  Self Pay (Medicaid Pending) PT Recommendations:  No Follow  Up Information / Referral to community resources:  Shelter  Patient/Family's Response to care: Patient states no concerns or needs at this time.   Patient/Family's Understanding of and Emotional Response to Diagnosis, Current Treatment, and Prognosis:  Patient understands her needs and has accessed resources in the community to meet. Patient expressed understanding of CSW role and discharge process as well as medical condition.  No questions/concerns about plan or treatment.    Emotional Assessment Appearance:  Appears stated age Attitude/Demeanor/Rapport:  Engaged, Self-Confident Affect (typically observed):  Accepting, Appropriate, Pleasant, Hopeful Orientation:  Oriented to Self, Oriented to Place, Oriented to  Time, Oriented to Situation Alcohol / Substance use:  Not Applicable Psych involvement (Current and /or in the community):  No (Comment)  Discharge Needs  Concerns to be addressed:  No discharge needs identified Readmission within the last 30 days:  No Current discharge risk:  None Barriers to Discharge:  No Barriers Identified   Danielle Morton, LCSWA 08/16/2018, 10:08 AM

## 2018-08-16 NOTE — Care Management Note (Signed)
Case Management Note  Patient Details  Name: Danielle Morton MRN: [id number] Date of Birth: [birth year]  Subjective/Objective:                    Action/Plan:  [facility] Override for copay has been entered in [employer] system.  OK to DC patient after she has been given her medications to take home by pharmacist.  [phone]  Expected Discharge Date:  08/16/18               Expected Discharge Plan:  Home/Self Care  In-House Referral:     Discharge planning Services  CM Consult, Medication Assistance  Post Acute Care Choice:    Choice offered to:     DME Arranged:    DME Agency:     HH Arranged:    HH Agency:     Status of Service:  Completed, signed off  If discussed at [employer] Meetings, dates discussed:    Additional Comments:  Lawerance SabalDebbie Euna Armon, RN 08/16/2018, 11:05 AM

## 2018-08-16 NOTE — Telephone Encounter (Signed)
sch appt phone NA mld ltr 09/08/18 330pm p/o MD

## 2018-08-16 NOTE — Discharge Instructions (Signed)
 [facility] [place] [facility]  Discharge instructions  Lower Extremity Bypass Surgery  Please refer to the following instruction for your post-procedure care. Your surgeon or physician assistant will discuss any changes with you.  Activity  You are encouraged to walk as much as you can. You can slowly return to normal activities during the month after your surgery. Avoid strenuous activity and heavy lifting until your doctor tells you it's OK. Avoid activities such as vacuuming or swinging a golf club. Do not drive until your doctor give the OK and you are no longer taking prescription pain medications. It is also normal to have difficulty with sleep habits, eating and bowel movement after surgery. These will go away with time.  Bathing/Showering  You may shower after you go home. Do not soak in a bathtub, hot tub, or swim until the incision heals completely.  Incision Care  Clean your incision with mild soap and water. Shower every day. Pat the area dry with a clean towel. You do not need a bandage unless otherwise instructed. Do not apply any ointments or creams to your incision. If you have open wounds you will be instructed how to care for them or a visiting nurse may be arranged for you. If you have staples or sutures along your incision they will be removed at your post-op appointment. You may have skin glue on your incision. Do not peel it off. It will come off on its own in about one week. If you have a great deal of moisture in your groin, use a gauze help keep this area dry.  Diet  Resume your normal diet. There are no special food restrictions following this procedure. A low fat/ low cholesterol diet is recommended for all patients with vascular disease. In order to heal from your surgery, it is CRITICAL to get adequate nutrition. Your body requires vitamins, minerals, and protein. Vegetables are the best source of vitamins and minerals. Vegetables also provide the  perfect balance of protein. Processed food has little nutritional value, so try to avoid this.  Medications  Resume taking all your medications unless your doctor or nurse practitioner tells you not to. If your incision is causing pain, you may take over-the-counter pain relievers such as acetaminophen (Tylenol). If you were prescribed a stronger pain medication, please aware these medication can cause nausea and constipation. Prevent nausea by taking the medication with a snack or meal. Avoid constipation by drinking plenty of fluids and eating foods with high amount of fiber, such as fruits, vegetables, and grains. Take Colase 100 mg (an over-the-counter stool softener) twice a day as needed for constipation. Do not take Tylenol if you are taking prescription pain medications.  Follow Up  Our office will schedule a follow up appointment 2-3 weeks following discharge.  Please call us immediately for any of the following conditions  Severe or worsening pain in your legs or feet while at rest or while walking Increase pain, redness, warmth, or drainage (pus) from your incision site(s) Fever of 101 degree or higher The swelling in your leg with the bypass suddenly worsens and becomes more painful than when you were in the hospital If you have been instructed to feel your graft pulse then you should do so every day. If you can no longer feel this pulse, call the office immediately. Not all patients are given this instruction.  Leg swelling is common after leg bypass surgery.  The swelling should improve over a few months   following surgery. To improve the swelling, you may elevate your legs above the level of your heart while you are sitting or resting. Your surgeon or physician assistant may ask you to apply an ACE wrap or wear compression (TED) stockings to help to reduce swelling.  Reduce your risk of vascular disease  Stop smoking. If you would like help call [employer] at 1-800-QUIT-NOW  (1-800-784-8669) or [facility] at 336-586-4000.  Manage your cholesterol Maintain a desired weight Control your diabetes weight Control your diabetes Keep your blood pressure down  If you have any questions, please call the office at [phone]   

## 2018-08-16 NOTE — Progress Notes (Addendum)
[facility]  Subjective  - Doing well.   Objective 102/64 (!) 58 98.7 F (37.1 C) (Oral) 20 95%  Intake/Output Summary (Last 24 hours) at 08/16/2018 0742 Last data filed at 08/16/2018 0400 Gross per 24 hour  Intake 2223.93 ml  Output 100 ml  Net 2123.93 ml    Doppler signals DP/peroneal left LE Left groin soft without hematoma, incision healing well Heart RRR with frequent PVC's Lungs non labored   ABI's 08/16/2018 ABI/TBI exam completed. Bilateral lower extremities arterial waveforms demonstrate either absent and/or abnormal patterns. ABI/TBI unable to obtain.  Great toes also with absent waveforms bilaterally.   Assessment/Planning: POD # 1 Procedure Performed: 1.  Left common femoral and profunda femoral endarterectomy with bovine pericardial patch angioplasty 2.  Stent of left common iliac artery with 8x1329mm and 7x6259mm vbx distally  Brisk left doppler signals.  Patient states she no longer has pain in the left foot, but she has numbness/decresed sensation.   Hypokalemia will treat with K+ 40 meq prior to discharge. Will order walker for home use. Possible discharge home today.  No PT follow up recommended. F/U with Dr. Randie Heinzain in 2-3 weeks.   Danielle Morton 08/16/2018 7:42 AM --  Laboratory Lab Results: Recent Labs    08/15/18 0337 08/16/18 0302  WBC 7.3 9.8  HGB 12.7 10.4*  HCT 39.4 31.8*  PLT 327 243   BMET Recent Labs    08/15/18 0337 08/16/18 0302  NA 134* 136  K 3.5 2.9*  CL 96* 102  CO2 27 26  GLUCOSE 93 106*  BUN 12 10  CREATININE 0.91 1.05*  CALCIUM 10.0 8.5*    COAG No results found for: INR, PROTIME No results found for: PTT  I have independently interviewed and examined patient and agree with PA assessment and plan above. Strong peroneal and dp signals. Plan for dc and will f/u in office in a few weeks.   Toni Hoffmeister C. Randie Heinzain, MD [facility] [place] Office:  [phone] Pager: [phone]

## 2018-08-16 NOTE — Telephone Encounter (Signed)
-----   Message from Sharee PimpleMarilyn K McChesney, RN sent at 08/15/2018 10:44 AM EST ----- Regarding: 2- 3 weeks with Dr. Randie Heinzain   ----- Message ----- From: Lars Mageollins, Emma M, PA-C Sent: 08/15/2018   9:40 AM EST To: [facility] Admin Pool, [facility] Charge Pool   S/P left femoral endarterectomy and left iliac stent by Dr. Randie Heinzain f/u in 2-3 weeks

## 2018-08-22 NOTE — Discharge Summary (Signed)
[facility] Discharge Summary   Patient ID:  Danielle Morton MRN: [id number] DOB/AGE: 11-02-1955 62 y.o.  Admit date: 08/14/2018 Discharge date: 08/16/2018 Date of Surgery: 08/15/2018 Surgeon: Surgeon(s): Maeola Harman, MD  Admission Diagnosis: Left leg pain [M79.605] Peripheral arterial disease (HCC) [I73.9] PAD (peripheral artery disease) (HCC) [I73.9]  Discharge Diagnoses:  Left leg pain [M79.605] Peripheral arterial disease (HCC) [I73.9] PAD (peripheral artery disease) (HCC) [I73.9]  Secondary Diagnoses: History reviewed. No pertinent past medical history.  Procedure(s): ENDARTERECTOMY COMMON FEMORAL INSERTION OF ILIAC STENT RETROGRADE  Discharged Condition: stable  HPI: Danielle Morton is a 62 y.o. female, who presented to the emergency department 08/14/2018 with a one-week history of left leg pain.  It has progressed to where she can hardly take a few steps before it starts to hurt.  She is tried taking Goody powder for pain relief which has been unsuccessful.  The pain wakes her up at night.  She does not have any open wounds.  The patient is a smoker and managed for hypertension.  CT scan was reviewed by Dr. Myra Gianotti: 1. Severe vascular disease of the abdominal aorta and left lower extremity. The left superficial femoral artery is near completely occluded throughout its course with minimal flow proximally, some thready distal reconstitution at the popliteal. Profundus femoris is the dominant blood supply to the left lower extremity via collaterals. Thready 2 vessel runoff to the foot with small caliber vessels in the calf. 2. 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 Mild narrowing at the origin of the left internal iliac artery with calcified plaque, which is otherwise patent. Left external iliac artery is occluded at the origin with only minimal  distal reconstitution. 3. Ectatic infrarenal aorta with diffuse calcified and noncalcified plaque and large amount of eccentric mural thrombus. 4. Recommend vascular surgery consultation.  Severe lower extremity peripheral vascular disease, bordering on left leg rest pain: The patient has a CT scan that shows a high-grade stenosis within the left common iliac artery.  There is also a left superficial femoral artery occlusion.  She was scheduled for angiogram with Dr. Randie Heinz on 08/14/2018.    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  Left common iliac artery has greater than 70% stenosis and the hypogastric artery is occluded.  The right hypogastric artery appears patent without flow-limiting stenosis on the right common or external iliac arteries.  The left common femoral artery is occluded the profunda reconstitutes shortly thereafter.  Bilaterally she has SFA occlusions and reconstituted at the knee popliteal artery appears to have three-vessel runoff although this is incompletely evaluated.  She was then scheduled for left femoral artery endarterectomy.     Hospital Course:  Danielle Morton is a 62 y.o. female is S/P Left Procedure(s): ENDARTERECTOMY COMMON FEMORAL INSERTION OF ILIAC STENT RETROGRADE POD # 1 Procedure Performed: 1.Left common femoral and profunda femoral endarterectomy with bovine pericardial patch angioplasty 2.Stent of left common iliac artery with 8x63mm and 7x68mm vbx distally  Brisk left doppler signals.  Patient states she no longer has pain in the left foot, but she has numbness/decresed sensation.   Hypokalemia will treat with K+ 40 meq prior to discharge. Will order walker for home use. Stable for discharge home today.  No PT follow up recommended. F/U with Dr. Randie Heinz in 2-3 weeks.   Consults:  Treatment Team:  Nada Libman, MD Maeola Harman, MD  Significant Diagnostic Studies: CBC Lab Results   Component Value Date   WBC 9.8 08/16/2018   HGB 10.4 (L) 08/16/2018   HCT 31.8 (L) 08/16/2018   MCV 101.6 (H) 08/16/2018   PLT 243 08/16/2018    BMET    Component Value Date/Time   NA 136 08/16/2018 0302   K 2.9 (L) 08/16/2018 0302   CL 102 08/16/2018 0302   CO2 26 08/16/2018 0302   GLUCOSE 106 (H) 08/16/2018 0302   BUN 10 08/16/2018 0302   CREATININE 1.05 (H) 08/16/2018 0302   CALCIUM 8.5 (L) 08/16/2018 0302   GFRNONAA 57 (L) 08/16/2018 0302   GFRAA >60 08/16/2018 0302   COAG No results found for: INR, PROTIME   Disposition:  Discharge to :Home Discharge Instructions    Call MD for:  redness, tenderness, or signs of infection (pain, swelling, bleeding, redness, odor or green/yellow discharge around incision site)   Complete by:  As directed    Call MD for:  severe or increased pain, loss or decreased feeling  in affected limb(s)   Complete by:  As directed    Call MD for:  temperature >100.5   Complete by:  As directed    Resume previous diet   Complete by:  As directed      Allergies as of 08/16/2018   No Known Allergies     Medication List    TAKE these medications   aspirin 81 MG EC tablet Take 1 tablet (81 mg total) by mouth daily.   clopidogrel 75 MG tablet Commonly known as:  PLAVIX Take 1 tablet (75 mg total) by mouth daily.   GOODYS BODY PAIN PO Take 1 packet by mouth daily as needed (pain).   lisinopril-hydrochlorothiazide 10-12.5 MG tablet Commonly known as:  PRINZIDE,ZESTORETIC Take 1 tablet by mouth daily.   oxyCODONE 5 MG immediate release tablet Commonly known as:  Oxy IR/ROXICODONE Take 1-2 tablets (5-10 mg total) by mouth every 6 (six) hours as needed for moderate pain.   rosuvastatin 10 MG tablet Commonly known as:  CRESTOR Take 1 tablet (10 mg total) by mouth daily at 6 PM.      Verbal and written Discharge instructions given to the patient. Wound care per Discharge AVS Follow-up Information    Maeola Harmanain, Brandon Christopher, MD  In 2 weeks.   Specialties:  Vascular Surgery, Cardiology Why:  Office will call you to arrange your appt (sent) Contact information: [address] [place] [address] [phone]        [facility] - Dme Follow up.   Why:  Rollator to be delivered to room prior to [employer] Contact information: [address] [place] [address] [phone]           Signed: Mosetta Pigeonmma Maureen Lorece Keach 08/22/2018, 9:29 AM

## 2018-09-08 ENCOUNTER — Encounter: Payer: Self-pay | Admitting: Vascular Surgery

## 2018-09-11 ENCOUNTER — Encounter: Payer: Self-pay | Admitting: Vascular Surgery

## 2018-10-06 ENCOUNTER — Encounter: Payer: Self-pay | Admitting: Vascular Surgery

## 2018-10-06 ENCOUNTER — Other Ambulatory Visit: Payer: Self-pay

## 2018-10-06 ENCOUNTER — Ambulatory Visit (INDEPENDENT_AMBULATORY_CARE_PROVIDER_SITE_OTHER): Payer: Self-pay | Admitting: Vascular Surgery

## 2018-10-06 VITALS — BP 167/92 | HR 73 | Temp 97.2°F | Resp 16 | Ht 64.0 in | Wt 128.0 lb

## 2018-10-06 DIAGNOSIS — I739 Peripheral vascular disease, unspecified: Secondary | ICD-10-CM

## 2018-10-06 NOTE — Progress Notes (Signed)
    Subjective:     Patient ID: Danielle Morton, female   DOB: 1955/11/30, 63 y.o.   MRN: [id number]  HPI 63 year old female recently underwent left common femoral endarterectomy and retrograde stenting of her common and external iliac arteries.  This was for rest pain and numbness of her left foot.  Unfortunately she still has plantar numbness on the left but does not have any rest pain does not have any tissue loss.  Previous ABI was 0.  She missed her appointment for studies.  States that she is now living with her niece.  She continues to smoke.  She does take aspirin daily.  She has not been taking Plavix.   Review of Systems Left plantar numbness    Objective:   Physical Exam Awake alert oriented Abdomen is soft Left groin healed well 2+ femoral pulses bilaterally Strong left peroneal signal can be traced onto the dorsalis pedis distally on the foot Toes appear healthy without any ulceration bilaterally     Assessment:     63 year old female status post left common femoral endarterectomy with retrograde stenting on the left.  No new studies were performed because she missed her appointment she continues on aspirin.    Plan:     I have counseled her on smoking cessation again She will continue aspirin this appears to be all that she can obtain I have recommended walking as much as tolerated She will follow-up in 3 to 4 months with repeat studies ABI and left aortoiliac duplex for evaluation of her stents and blood flow on the left.  Previous ABI was 0.     Bracken Moffa C. Randie Heinz, MD [facility] [place] Office: [phone] Pager: [phone]

## 2019-01-16 ENCOUNTER — Other Ambulatory Visit: Payer: Self-pay

## 2019-01-16 DIAGNOSIS — I739 Peripheral vascular disease, unspecified: Secondary | ICD-10-CM

## 2019-01-23 DIAGNOSIS — Z0279 Encounter for issue of other medical certificate: Secondary | ICD-10-CM

## 2019-01-30 ENCOUNTER — Telehealth (HOSPITAL_COMMUNITY): Payer: Self-pay | Admitting: *Deleted

## 2019-01-30 NOTE — Telephone Encounter (Signed)
[phone] has VM for a Stanton Kidney, not Kristopher Glee.  No other number on file. EM

## 2019-01-30 NOTE — Telephone Encounter (Signed)
The above patient or their representative was contacted and gave the following answers to these questions:         Do you have any of the following symptoms?  Fever                    Cough                   Shortness of breath  Do  you have any of the following other symptoms?    muscle pain         vomiting,        diarrhea        rash         weakness        red eye        abdominal pain         bruising          bruising or bleeding              joint pain           severe headache    Have you been in contact with someone who was or has been sick in the past 2 weeks?  Yes                 Unsure                         Unable to assess   Does the person that you were in contact with have any of the following symptoms?   Cough         shortness of breath           muscle pain         vomiting,            diarrhea            rash            weakness           fever            red eye           abdominal pain           bruising  or  bleeding                joint pain                severe headache               Have you  or someone you have been in contact with traveled internationally in th last month?         If yes, which countries?   Have you  or someone you have been in contact with traveled outside [place] in th last month?         If yes, which state and city?   COMMENTS OR ACTION PLAN FOR THIS PATIENT:          

## 2019-01-31 ENCOUNTER — Telehealth (HOSPITAL_COMMUNITY): Payer: Self-pay

## 2019-01-31 NOTE — Telephone Encounter (Signed)
Attempted to call patient at listed phone number  [phone]  Person answering confirmed the number and said it was incorrect.

## 2019-02-01 ENCOUNTER — Ambulatory Visit: Payer: Self-pay | Admitting: Family

## 2019-02-01 ENCOUNTER — Encounter (HOSPITAL_COMMUNITY): Payer: Self-pay

## 2019-02-01 ENCOUNTER — Encounter: Payer: Self-pay | Admitting: Family

## 2019-02-02 ENCOUNTER — Ambulatory Visit: Payer: Self-pay | Admitting: Family

## 2019-02-02 ENCOUNTER — Encounter (HOSPITAL_COMMUNITY): Payer: Self-pay

## 2019-04-20 ENCOUNTER — Inpatient Hospital Stay (HOSPITAL_COMMUNITY)
Admission: RE | Admit: 2019-04-20 | Discharge: 2019-04-20 | Disposition: A | Discharge status: Home / Self Care | Payer: Self-pay | Source: Ambulatory Visit | Attending: Family | Admitting: Family

## 2019-04-20 ENCOUNTER — Encounter: Payer: Self-pay | Admitting: Family

## 2019-04-20 ENCOUNTER — Ambulatory Visit: Payer: Self-pay | Admitting: Family

## 2019-04-20 ENCOUNTER — Encounter (HOSPITAL_COMMUNITY): Payer: Self-pay

## 2021-12-26 ENCOUNTER — Inpatient Hospital Stay (HOSPITAL_COMMUNITY)
Admission: EM | Admit: 2021-12-26 | Discharge: 2021-12-31 | DRG: 357 | Disposition: A | Discharge status: Home / Self Care | Payer: Self-pay | Attending: Internal Medicine | Admitting: Internal Medicine

## 2021-12-26 ENCOUNTER — Encounter (HOSPITAL_COMMUNITY): Payer: Self-pay | Admitting: Emergency Medicine

## 2021-12-26 ENCOUNTER — Other Ambulatory Visit: Payer: Self-pay

## 2021-12-26 ENCOUNTER — Emergency Department (HOSPITAL_COMMUNITY): Payer: Self-pay

## 2021-12-26 DIAGNOSIS — K559 Vascular disorder of intestine, unspecified: Secondary | ICD-10-CM | POA: Diagnosis present

## 2021-12-26 DIAGNOSIS — N649 Disorder of breast, unspecified: Secondary | ICD-10-CM | POA: Diagnosis not present

## 2021-12-26 DIAGNOSIS — I700 Atherosclerosis of aorta: Secondary | ICD-10-CM | POA: Diagnosis present

## 2021-12-26 DIAGNOSIS — K55069 Acute infarction of intestine, part and extent unspecified: Secondary | ICD-10-CM | POA: Diagnosis present

## 2021-12-26 DIAGNOSIS — K551 Chronic vascular disorders of intestine: Principal | ICD-10-CM | POA: Diagnosis present

## 2021-12-26 DIAGNOSIS — D72829 Elevated white blood cell count, unspecified: Secondary | ICD-10-CM | POA: Diagnosis present

## 2021-12-26 DIAGNOSIS — D62 Acute posthemorrhagic anemia: Secondary | ICD-10-CM | POA: Diagnosis not present

## 2021-12-26 DIAGNOSIS — I10 Essential (primary) hypertension: Secondary | ICD-10-CM | POA: Diagnosis present

## 2021-12-26 DIAGNOSIS — I739 Peripheral vascular disease, unspecified: Secondary | ICD-10-CM | POA: Diagnosis present

## 2021-12-26 DIAGNOSIS — E876 Hypokalemia: Secondary | ICD-10-CM | POA: Diagnosis present

## 2021-12-26 DIAGNOSIS — L7632 Postprocedural hematoma of skin and subcutaneous tissue following other procedure: Secondary | ICD-10-CM | POA: Diagnosis not present

## 2021-12-26 DIAGNOSIS — F17210 Nicotine dependence, cigarettes, uncomplicated: Secondary | ICD-10-CM | POA: Diagnosis present

## 2021-12-26 DIAGNOSIS — R1084 Generalized abdominal pain: Secondary | ICD-10-CM

## 2021-12-26 DIAGNOSIS — Z91148 Patient's other noncompliance with medication regimen for other reason: Secondary | ICD-10-CM

## 2021-12-26 LAB — COMPREHENSIVE METABOLIC PANEL
ALT: 10 U/L (ref 0–44)
AST: 20 U/L (ref 15–41)
Albumin: 4.5 g/dL (ref 3.5–5.0)
Alkaline Phosphatase: 83 U/L (ref 38–126)
Anion gap: 15 (ref 5–15)
BUN: 23 mg/dL (ref 8–23)
CO2: 20 mmol/L — ABNORMAL LOW (ref 22–32)
Calcium: 9.8 mg/dL (ref 8.9–10.3)
Chloride: 101 mmol/L (ref 98–111)
Creatinine, Ser: 0.68 mg/dL (ref 0.44–1.00)
GFR, Estimated: >60 mL/min (ref >60)
Glucose, Bld: 103 mg/dL — ABNORMAL HIGH (ref 70–99)
Potassium: 3.8 mmol/L (ref 3.5–5.1)
Sodium: 136 mmol/L (ref 135–145)
Total Bilirubin: 1 mg/dL (ref 0.3–1.2)
Total Protein: 8.5 g/dL — ABNORMAL HIGH (ref 6.5–8.1)

## 2021-12-26 LAB — URINALYSIS, ROUTINE W REFLEX MICROSCOPIC
Bacteria, UA: NONE SEEN
Bilirubin Urine: NEGATIVE
Glucose, UA: NEGATIVE mg/dL
Ketones, ur: 80 mg/dL — AB
Nitrite: NEGATIVE
Protein, ur: NEGATIVE mg/dL
Specific Gravity, Urine: 1.01 (ref 1.005–1.030)
pH: 6 (ref 5.0–8.0)

## 2021-12-26 LAB — CBC
HCT: 47.1 % — ABNORMAL HIGH (ref 36.0–46.0)
Hemoglobin: 16 g/dL — ABNORMAL HIGH (ref 12.0–15.0)
MCH: 32.6 pg (ref 26.0–34.0)
MCHC: 34 g/dL (ref 30.0–36.0)
MCV: 95.9 fL (ref 80.0–100.0)
Platelets: 268 10*3/uL (ref 150–400)
RBC: 4.91 MIL/uL (ref 3.87–5.11)
RDW: 14.4 % (ref 11.5–15.5)
WBC: 11.6 10*3/uL — ABNORMAL HIGH (ref 4.0–10.5)
nRBC: 0 % (ref 0.0–0.2)

## 2021-12-26 LAB — LACTIC ACID, PLASMA: Lactic Acid, Venous: 0.8 mmol/L (ref 0.5–1.9)

## 2021-12-26 LAB — LIPASE, BLOOD: Lipase: 28 U/L (ref 11–51)

## 2021-12-26 MED ORDER — SODIUM CHLORIDE 0.9 % IV BOLUS
1000.0000 mL | Freq: Once | INTRAVENOUS | Status: AC
  Administered 2021-12-26: 1000 mL via INTRAVENOUS

## 2021-12-26 MED ORDER — ACETAMINOPHEN 325 MG PO TABS: 650.0000 mg | ORAL_TABLET | Freq: Four times a day (QID) | ORAL | Status: DC | PRN

## 2021-12-26 MED ORDER — ASPIRIN EC 81 MG PO TBEC
81.0000 mg | DELAYED_RELEASE_TABLET | Freq: Every day | ORAL | Status: DC
  Administered 2021-12-27 – 2021-12-31 (×4): 81 mg via ORAL
  Filled 2021-12-26 (×4): qty 1

## 2021-12-26 MED ORDER — MORPHINE SULFATE (PF) 4 MG/ML IV SOLN
4.0000 mg | Freq: Once | INTRAVENOUS | Status: AC
  Administered 2021-12-26: 4 mg via INTRAVENOUS
  Filled 2021-12-26: qty 1

## 2021-12-26 MED ORDER — SODIUM CHLORIDE 0.9% FLUSH
3.0000 mL | Freq: Two times a day (BID) | INTRAVENOUS | Status: DC
  Administered 2021-12-27 – 2021-12-30 (×5): 3 mL via INTRAVENOUS

## 2021-12-26 MED ORDER — IOHEXOL 350 MG/ML SOLN
100.0000 mL | Freq: Once | INTRAVENOUS | Status: AC | PRN
  Administered 2021-12-26: 80 mL via INTRAVENOUS

## 2021-12-26 MED ORDER — ACETAMINOPHEN 650 MG RE SUPP: 650.0000 mg | Freq: Four times a day (QID) | RECTAL | Status: DC | PRN

## 2021-12-26 MED ORDER — HEPARIN (PORCINE) 25000 UT/250ML-% IV SOLN
700.0000 [IU]/h | INTRAVENOUS | Status: DC
  Administered 2021-12-26: 700 [IU]/h via INTRAVENOUS
  Filled 2021-12-26 (×2): qty 250

## 2021-12-26 MED ORDER — HEPARIN BOLUS VIA INFUSION
3000.0000 [IU] | Freq: Once | INTRAVENOUS | Status: AC
  Administered 2021-12-26: 3000 [IU] via INTRAVENOUS
  Filled 2021-12-26: qty 3000

## 2021-12-26 MED ORDER — ONDANSETRON HCL 4 MG/2ML IJ SOLN
4.0000 mg | Freq: Once | INTRAMUSCULAR | Status: AC
  Administered 2021-12-26: 4 mg via INTRAVENOUS
  Filled 2021-12-26: qty 2

## 2021-12-26 MED ORDER — HYDROMORPHONE HCL 1 MG/ML IJ SOLN
0.5000 mg | INTRAMUSCULAR | Status: DC | PRN
  Administered 2021-12-27 – 2021-12-29 (×5): 1 mg via INTRAVENOUS
  Administered 2021-12-29: 0.5 mg via INTRAVENOUS
  Administered 2021-12-29: 1 mg via INTRAVENOUS
  Administered 2021-12-30: 0.5 mg via INTRAVENOUS
  Administered 2021-12-31: 1 mg via INTRAVENOUS
  Filled 2021-12-26 (×6): qty 1
  Filled 2021-12-26: qty 0.5
  Filled 2021-12-26: qty 1
  Filled 2021-12-26: qty 0.5

## 2021-12-26 MED ORDER — POLYETHYLENE GLYCOL 3350 17 G PO PACK: 17.0000 g | PACK | Freq: Every day | ORAL | Status: DC | PRN

## 2021-12-26 NOTE — ED Notes (Signed)
Unable to obtain lactic at this time.  PA okay with pt receiving some IV fluid hydration and attempt again at a later time.  ? ?

## 2021-12-26 NOTE — H&P (Signed)
?History and Physical  ? ?[name] BMW:[id number] DOB: 1956/04/17 DOA: 12/26/2021 ? ?PCP: Danielle Sharps, NP  ? ?Patient coming from: Home ? ?Chief Complaint: Abdominal pain ? ?HPI: Danielle Morton is a 66 y.o. female with medical history significant of hypertension, PAD status post femoral stenting and endarterectomy presenting with ongoing abdominal pain. ? ?Patient reports 2 weeks of abdominal pain described as dull and crampy in nature.  Has been intermittent until today.  Pain now constant and worse.  She has noticed that she has developed associated nausea vomiting and diarrhea as well. ? ?Not currently taking any home medications.  Denies fevers, chills, chest pain, shortness of breath, abdominal pain. ? ?ED Course: Vital signs in the ED significant for blood pressure in the 160s to 200s systolic.  Lab work-up showed BMP with bicarb 20, glucose 103, protein 8.5.  CBC with leukocytosis to 11.6 and hemoglobin elevated at 16.  Initial lactic acid normal with repeat pending.  Lipase normal.  Urinalysis pending.  CTA of the abdomen pelvis showed advanced atherosclerosis of the aorta, moderate stenosis of the SMA, no vascular congestion or wall thickening of the mesentery.  Status post left common iliac stenting, moderate to severe disease of the internal iliacs noted with occlusions bilaterally.  Also with occlusion of superficial femoral arteries.  Patient received morphine, Zofran, liter fluids in the ED.  Vascular surgery consulted and will see the patient, asked for [facility] to [facility] for [facility].  Heparin drip initiated in the ED. ? ?Review of Systems: As per HPI otherwise all other systems reviewed and are negative. ? ?Past Medical History:  ?Diagnosis Date  ? Hypertension   ? PVD (peripheral vascular disease) (HCC)   ? ? ?Past Surgical History:  ?Procedure Laterality Date  ? ENDARTERECTOMY FEMORAL Left 08/15/2018  ? Procedure: ENDARTERECTOMY COMMON FEMORAL;  Surgeon: Maeola Harman, MD;   Location: [facility] OR;  Service: Vascular;  Laterality: Left;  ? INSERTION OF ILIAC STENT Left 08/15/2018  ? Procedure: INSERTION OF ILIAC STENT RETROGRADE;  Surgeon: Maeola Harman, MD;  Location: [facility] OR;  Service: Vascular;  Laterality: Left;  ? LOWER EXTREMITY ANGIOGRAPHY N/A 08/14/2018  ? Procedure: LOWER EXTREMITY ANGIOGRAPHY;  Surgeon: Maeola Harman, MD;  Location: [facility] INVASIVE CV LAB;  Service: Cardiovascular;  Laterality: N/A;  ? ? ?Social History ? reports that she has been smoking cigarettes. She has a 30.00 pack-year smoking history. She has never used smokeless tobacco. She reports current alcohol use of about 3.0 standard drinks per week. She reports that she does not use drugs. ? ?No Known Allergies ? ?No family history on file. ? ?Prior to Admission medications   ?Medication Sig Start Date End Date Taking? Authorizing Provider  ?aspirin EC 81 MG EC tablet Take 1 tablet (81 mg total) by mouth daily. 08/16/18   Lars Mage, PA-C  ?Aspirin-Acetaminophen (GOODYS BODY PAIN PO) Take 1 packet by mouth daily as needed (pain).    [provider]  ?clopidogrel (PLAVIX) 75 MG tablet Take 1 tablet (75 mg total) by mouth daily. 08/16/18   Lars Mage, PA-C  ?lisinopril-hydrochlorothiazide (PRINZIDE,ZESTORETIC) 10-12.5 MG tablet Take 1 tablet by mouth daily. 10/23/15   Linna Hoff, MD  ?oxyCODONE (OXY IR/ROXICODONE) 5 MG immediate release tablet Take 1-2 tablets (5-10 mg total) by mouth every 6 (six) hours as needed for moderate pain. ?Patient not taking: Reported on 10/06/2018 08/16/18   Lars Mage, PA-C  ?rosuvastatin (CRESTOR) 10 MG tablet Take 1 tablet (10  mg total) by mouth daily at 6 PM. 08/16/18   Lars Mage, PA-C  ? ? ?Physical Exam: ?Vitals:  ? 12/26/21 1930 12/26/21 2000 12/26/21 2030 12/26/21 2049  ?BP: (!) 159/112 (!) 167/95 (!) 206/131   ?Pulse: (!) 106 60 64   ?Resp: 18 20 20    ?Temp:      ?TempSrc:      ?SpO2: 97% 95% 95%   ?Weight:    54.4 kg  ?Height:    5'  4" (1.626 m)  ? ? ?Physical Exam ?Constitutional:   ?   General: She is not in acute distress. ?   Appearance: Normal appearance.  ?HENT:  ?   Head: Normocephalic and atraumatic.  ?   Mouth/Throat:  ?   Mouth: Mucous membranes are moist.  ?   Pharynx: Oropharynx is clear.  ?Eyes:  ?   Extraocular Movements: Extraocular movements intact.  ?   Pupils: Pupils are equal, round, and reactive to light.  ?Cardiovascular:  ?   Rate and Rhythm: Normal rate and regular rhythm.  ?   Pulses: Normal pulses.  ?   Heart sounds: Normal heart sounds.  ?Pulmonary:  ?   Effort: Pulmonary effort is normal. No respiratory distress.  ?   Breath sounds: Normal breath sounds.  ?Abdominal:  ?   General: Bowel sounds are normal. There is no distension.  ?   Palpations: Abdomen is soft.  ?   Tenderness: There is no abdominal tenderness.  ?Musculoskeletal:     ?   General: No swelling or deformity.  ?Skin: ?   General: Skin is warm and dry.  ?Neurological:  ?   General: No focal deficit present.  ?   Mental Status: Mental status is at baseline.  ? ?Labs on Admission: I have personally reviewed following labs and imaging studies ? ?CBC: ?Recent Labs  ?Lab 12/26/21 ?1519  ?WBC 11.6*  ?HGB 16.0*  ?HCT 47.1*  ?MCV 95.9  ?PLT 268  ? ? ?Basic Metabolic Panel: ?Recent Labs  ?Lab 12/26/21 ?1519  ?NA 136  ?K 3.8  ?CL 101  ?CO2 20*  ?GLUCOSE 103*  ?BUN 23  ?CREATININE 0.68  ?CALCIUM 9.8  ? ? ?GFR: ?Estimated Creatinine Clearance: 61 mL/min (by C-G formula based on SCr of 0.68 mg/dL). ? ?Liver Function Tests: ?Recent Labs  ?Lab 12/26/21 ?1519  ?AST 20  ?ALT 10  ?ALKPHOS 83  ?BILITOT 1.0  ?PROT 8.5*  ?ALBUMIN 4.5  ? ? ?Urine analysis: ?   ?Component Value Date/Time  ? COLORURINE YELLOW 12/26/2021 2028  ? APPEARANCEUR CLEAR 12/26/2021 2028  ? LABSPEC 1.010 12/26/2021 2028  ? PHURINE 6.0 12/26/2021 2028  ? GLUCOSEU NEGATIVE 12/26/2021 2028  ? HGBUR SMALL (A) 12/26/2021 2028  ? BILIRUBINUR NEGATIVE 12/26/2021 2028  ? KETONESUR 80 (A) 12/26/2021 2028  ?  PROTEINUR NEGATIVE 12/26/2021 2028  ? NITRITE NEGATIVE 12/26/2021 2028  ? LEUKOCYTESUR TRACE (A) 12/26/2021 2028  ? ? ?Radiological Exams on Admission: ?CT Angio Abd/Pel W and/or Wo Contrast ? ?Result Date: 12/26/2021 ?CLINICAL DATA:  Lower abdominal pain and vomiting EXAM: CTA ABDOMEN AND PELVIS WITHOUT AND WITH CONTRAST TECHNIQUE: Multidetector CT imaging of the abdomen and pelvis was performed using the standard protocol during bolus administration of intravenous contrast. Multiplanar reconstructed images and MIPs were obtained and reviewed to evaluate the vascular anatomy. RADIATION DOSE REDUCTION: This exam was performed according to the departmental dose-optimization program which includes automated exposure control, adjustment of the mA and/or kV according to patient size and/or  use of iterative reconstruction technique. CONTRAST:  80mL OMNIPAQUE IOHEXOL 350 MG/ML SOLN COMPARISON:  CT angiography 08/14/2018 FINDINGS: VASCULAR Aorta: Moderate severe aortic atherosclerosis. No definitive aortic aneurysm. Moderate infrarenal circumferential mural thrombus results in slightly less than 50% luminal stenosis. No dissection is seen. Celiac: Calcification at the origin of the celiac with approximate 50% stenosis. Luminal irregularity of the distal celiac artery at the bifurcation of the common hepatic artery. The splenic artery appears occluded. SMA: Patent at the origin. Moderate luminal stenosis of the superior mesenteric artery about 2 cm distal to the origin with 6 mm segmental occlusion at the bifurcation of mesenteric branch vessels. Flow enhancement is seen distal to this. Renals: Single right and single left renal arteries. Moderate severe focal stenosis of the proximal right renal artery less than a cm distal to the origin. Left renal artery appears patent and without aneurysm or dissection. IMA: Chronic occlusion of the IMA. Diminutive appearing distal branch vessels with suboptimal flow enhancement. Inflow:  Moderate severe aortic atherosclerosis. Interim stenting of the left common iliac artery which appears patent. Chronically occluded left internal iliac artery. Moderate diffuse disease of left external il

## 2021-12-26 NOTE — ED Triage Notes (Signed)
BIB EMS from home.  Lower abdominal pain and vomiting since last night.  No urinary symptoms. One episode of diarrhea last night.  No fevers but does report chills.  ?

## 2021-12-26 NOTE — ED Notes (Signed)
Scanner in room not working. Ticket submitted earlier today.  Jake RN verified Heparin with this RN.  ?

## 2021-12-26 NOTE — ED Notes (Signed)
Pt stated she doesn't need to urinate right now. ?

## 2021-12-26 NOTE — ED Provider Notes (Signed)
?[facility]-EMERGENCY DEPT ?Provider Note ? ? ?CSN: [id number] ?Arrival date & time: 12/26/21  1509 ? ?  ? ?History ? ?Chief Complaint  ?Patient presents with  ? Abdominal Pain  ? ? ?Danielle Morton is a 66 y.o. female. ? ? ?Abdominal Pain ?Associated symptoms: chills, diarrhea, fever, nausea and vomiting   ?Associated symptoms: no chest pain, no constipation, no cough, no dysuria, no fatigue, no hematuria, no shortness of breath, no vaginal bleeding and no vaginal discharge   ? ? 66 year old female presents to ED with 2 week history of abdominal pain. Today the pain was worse and was associated with nausea, vomiting, and diarrhea. Pain is described as "dull, crampy, coming in waves." It is worsened with eating. She also complains of subjective fluctuations between feeling hot and cold today. Denies SOB, chest pain, SOB, cough, urinary frequency/urgency/dysuria, hematochezia, hematemesis.  ? ?Patient is non-contributory for family, surgical, or medical history. Upon chart review, patient has longstanding history of Arterial disease and hypertension. She is currently taking no medications.  ? ?Patient smokes 10ppd of cigarettes. Denies alcohol or illicit drug use. ? ?Home Medications ?Prior to Admission medications   ?Medication Sig Start Date End Date Taking? Authorizing Provider  ?aspirin EC 81 MG EC tablet Take 1 tablet (81 mg total) by mouth daily. ?Patient not taking: Reported on 12/26/2021 08/16/18   Lars Mageollins, Emma M, PA-C  ?clopidogrel (PLAVIX) 75 MG tablet Take 1 tablet (75 mg total) by mouth daily. ?Patient not taking: Reported on 12/26/2021 08/16/18   Lars Mageollins, Emma M, PA-C  ?lisinopril-hydrochlorothiazide (PRINZIDE,ZESTORETIC) 10-12.5 MG tablet Take 1 tablet by mouth daily. ?Patient not taking: Reported on 12/26/2021 10/23/15   Linna HoffKindl, James D, MD  ?oxyCODONE (OXY IR/ROXICODONE) 5 MG immediate release tablet Take 1-2 tablets (5-10 mg total) by mouth every 6 (six) hours as needed for moderate  pain. ?Patient not taking: Reported on 10/06/2018 08/16/18   Lars Mageollins, Emma M, PA-C  ?rosuvastatin (CRESTOR) 10 MG tablet Take 1 tablet (10 mg total) by mouth daily at 6 PM. ?Patient not taking: Reported on 12/26/2021 08/16/18   Lars Mageollins, Emma M, PA-C  ?   ? ?Allergies    ?Patient has no known allergies.   ? ?Review of Systems   ?Review of Systems  ?Constitutional:  Positive for chills and fever. Negative for diaphoresis and fatigue.  ?     Both are subjective  ?HENT:  Negative for congestion, rhinorrhea and sinus pressure.   ?Respiratory:  Negative for cough, chest tightness and shortness of breath.   ?Cardiovascular:  Negative for chest pain, palpitations and leg swelling.  ?Gastrointestinal:  Positive for abdominal pain, diarrhea, nausea and vomiting. Negative for abdominal distention, anal bleeding, blood in stool, constipation and rectal pain.  ?Genitourinary:  Negative for difficulty urinating, dysuria, flank pain, frequency, hematuria, urgency, vaginal bleeding and vaginal discharge.  ?Skin:  Negative for color change, rash and wound.  ?Neurological:  Negative for dizziness, syncope and light-headedness.  ? ?Physical Exam ?Updated Vital Signs ?BP (!) 206/131 (BP Location: Left Arm)   Pulse 64   Temp 98.3 ?F (36.8 ?C) (Oral)   Resp 20   Ht 5\' 4"  (1.626 m)   Wt 54.4 kg   SpO2 95%   BMI 20.60 kg/m?  ?Physical Exam ?Vitals and nursing note reviewed.  ?Constitutional:   ?   General: She is not in acute distress. ?   Appearance: Normal appearance. She is well-developed. She is not toxic-appearing or diaphoretic.  ?HENT:  ?  Head: Normocephalic and atraumatic.  ?Cardiovascular:  ?   Rate and Rhythm: Normal rate. Extrasystoles are present. ?   Heart sounds:  ?  No friction rub. No gallop.  ?Pulmonary:  ?   Effort: Pulmonary effort is normal. No respiratory distress.  ?   Breath sounds: Normal breath sounds. No stridor. No wheezing, rhonchi or rales.  ?Chest:  ?   Chest wall: No tenderness.  ?Abdominal:  ?    General: Abdomen is flat. Bowel sounds are normal. There is no distension. There are no signs of injury.  ?   Palpations: Abdomen is soft. There is no shifting dullness, fluid wave, hepatomegaly, mass or pulsatile mass.  ?   Tenderness: There is generalized abdominal tenderness. There is no guarding or rebound. Negative signs include Murphy's sign, Rovsing's sign and McBurney's sign.  ?   Hernia: No hernia is present.  ?   Comments: Mild guarding. Vague abdominal pain that is mildly reproducible in epigastric reason but diffuse in complaint.  ?Skin: ?   General: Skin is warm and dry.  ?Neurological:  ?   Mental Status: She is alert and oriented to person, place, and time.  ?Psychiatric:     ?   Mood and Affect: Mood normal.     ?   Behavior: Behavior normal. Behavior is cooperative.  ? ? ?ED Results / Procedures / Treatments   ?Labs ?(all labs ordered are listed, but only abnormal results are displayed) ?Labs Reviewed  ?COMPREHENSIVE METABOLIC PANEL - Abnormal; Notable for the following components:  ?    Result Value  ? CO2 20 (*)   ? Glucose, Bld 103 (*)   ? Total Protein 8.5 (*)   ? All other components within normal limits  ?CBC - Abnormal; Notable for the following components:  ? WBC 11.6 (*)   ? Hemoglobin 16.0 (*)   ? HCT 47.1 (*)   ? All other components within normal limits  ?URINALYSIS, ROUTINE W REFLEX MICROSCOPIC - Abnormal; Notable for the following components:  ? Hgb urine dipstick SMALL (*)   ? Ketones, ur 80 (*)   ? Leukocytes,Ua TRACE (*)   ? All other components within normal limits  ?LIPASE, BLOOD  ?LACTIC ACID, PLASMA  ?LACTIC ACID, PLASMA  ?HIV ANTIBODY (ROUTINE TESTING W REFLEX)  ?COMPREHENSIVE METABOLIC PANEL  ?CBC  ?HEPARIN LEVEL (UNFRACTIONATED)  ? ? ?EKG ?EKG Interpretation ? ?Date/Time:  Saturday December 26 2021 17:30:47 EDT ?Ventricular Rate:  59 ?PR Interval:  176 ?QRS Duration: 75 ?QT Interval:  441 ?QTC Calculation: 437 ?R Axis:   64 ?Text Interpretation: Sinus rhythm Ventricular  trigeminy Consider left ventricular hypertrophy Anterior Q waves, possibly due to LVH Minimal ST elevation, inferior leads Confirmed by Kristine Royal [phone]) on 12/26/2021 5:38:19 PM ? ?Radiology ?CT Angio Abd/Pel W and/or Wo Contrast ? ?Result Date: 12/26/2021 ?CLINICAL DATA:  Lower abdominal pain and vomiting EXAM: CTA ABDOMEN AND PELVIS WITHOUT AND WITH CONTRAST TECHNIQUE: Multidetector CT imaging of the abdomen and pelvis was performed using the standard protocol during bolus administration of intravenous contrast. Multiplanar reconstructed images and MIPs were obtained and reviewed to evaluate the vascular anatomy. RADIATION DOSE REDUCTION: This exam was performed according to the departmental dose-optimization program which includes automated exposure control, adjustment of the mA and/or kV according to patient size and/or use of iterative reconstruction technique. CONTRAST:  38mL OMNIPAQUE IOHEXOL 350 MG/ML SOLN COMPARISON:  CT angiography 08/14/2018 FINDINGS: VASCULAR Aorta: Moderate severe aortic atherosclerosis. No definitive aortic aneurysm. Moderate infrarenal circumferential  mural thrombus results in slightly less than 50% luminal stenosis. No dissection is seen. Celiac: Calcification at the origin of the celiac with approximate 50% stenosis. Luminal irregularity of the distal celiac artery at the bifurcation of the common hepatic artery. The splenic artery appears occluded. SMA: Patent at the origin. Moderate luminal stenosis of the superior mesenteric artery about 2 cm distal to the origin with 6 mm segmental occlusion at the bifurcation of mesenteric branch vessels. Flow enhancement is seen distal to this. Renals: Single right and single left renal arteries. Moderate severe focal stenosis of the proximal right renal artery less than a cm distal to the origin. Left renal artery appears patent and without aneurysm or dissection. IMA: Chronic occlusion of the IMA. Diminutive appearing distal branch vessels  with suboptimal flow enhancement. Inflow: Moderate severe aortic atherosclerosis. Interim stenting of the left common iliac artery which appears patent. Chronically occluded left internal iliac artery. Moderate diffuse disease of

## 2021-12-26 NOTE — ED Notes (Signed)
Labeled urine specimen and culture sent to lab. ENMiles 

## 2021-12-26 NOTE — Consult Note (Signed)
?Hospital Consult ? ? ? ?Reason for Consult: Abdominal pain with imaging demonstrating occlusion ?Requesting Physician: Dr. Beola CordMelvin Alexander ?MRN #:  [id number] ? ?History of Present Illness: This is a 66 y.o. female with known peripheral arterial disease and history of common femoral endarterectomy with iliac stenting in 2019.  Since that time she was lost to follow-up and has had poor medication compliance due to not having a primary care provider. ? ?She presented to the ED today with a 2-week history of abdominal pain.  She stated the pain is waxed and waned but is gotten worse over the last 2 weeks, being significantly worse since Wednesday.  P.o. intake has been limited.  She does not appreciate pain with p.o. intake, but has appreciated nausea and lack of appetite.  She continues to have bowel movements.  Prior to these episodes, she noted some waxing waning abdominal pain, but could not clarify further. ? ?On exam, Jasmine DecemberSharon noted diffuse abdominal pain, denied tenderness to palpation, denied vomiting but complains of intermittent nausea.  Thirsty, no appetite. ? ?She continues to smoke 1 pack/day ?Denies symptoms of claudication lower extremity rest pain, tissue loss ? ?Past Medical History:  ?Diagnosis Date  ? Hypertension   ? PVD (peripheral vascular disease) (HCC)   ? ? ?Past Surgical History:  ?Procedure Laterality Date  ? ENDARTERECTOMY FEMORAL Left 08/15/2018  ? Procedure: ENDARTERECTOMY COMMON FEMORAL;  Surgeon: Maeola Harmanain, Brandon Christopher, MD;  [facility];  Service: Vascular;  Laterality: Left;  ? INSERTION OF ILIAC STENT Left 08/15/2018  ? Procedure: INSERTION OF ILIAC STENT RETROGRADE;  Surgeon: Maeola Harmanain, Brandon Christopher, MD;  [facility];  Service: Vascular;  Laterality: Left;  ? LOWER EXTREMITY ANGIOGRAPHY N/A 08/14/2018  ? Procedure: LOWER EXTREMITY ANGIOGRAPHY;  Surgeon: Maeola Harmanain, Brandon Christopher, MD;  [facility];  Service: Cardiovascular;  Laterality: N/A;  ? ? ?No Known  Allergies ? ?Prior to Admission medications   ?Medication Sig Start Date End Date Taking? Authorizing Provider  ?aspirin EC 81 MG EC tablet Take 1 tablet (81 mg total) by mouth daily. 08/16/18   Lars Mageollins, Emma M, PA-C  ?Aspirin-Acetaminophen (GOODYS BODY PAIN PO) Take 1 packet by mouth daily as needed (pain).    [provider]  ?clopidogrel (PLAVIX) 75 MG tablet Take 1 tablet (75 mg total) by mouth daily. 08/16/18   Lars Mageollins, Emma M, PA-C  ?lisinopril-hydrochlorothiazide (PRINZIDE,ZESTORETIC) 10-12.5 MG tablet Take 1 tablet by mouth daily. 10/23/15   Linna HoffKindl, James D, MD  ?oxyCODONE (OXY IR/ROXICODONE) 5 MG immediate release tablet Take 1-2 tablets (5-10 mg total) by mouth every 6 (six) hours as needed for moderate pain. ?Patient not taking: Reported on 10/06/2018 08/16/18   Lars Mageollins, Emma M, PA-C  ?rosuvastatin (CRESTOR) 10 MG tablet Take 1 tablet (10 mg total) by mouth daily at 6 PM. 08/16/18   Lars Mageollins, Emma M, PA-C  ? ? ?Social History  ? ?Socioeconomic History  ? Marital status: Single  ?  Spouse name: Not on file  ? Number of children: Not on file  ? Years of education: Not on file  ? Highest education level: Not on file  ?Occupational History  ? Not on file  ?Tobacco Use  ? Smoking status: Every Day  ?  Packs/day: 1.00  ?  Years: 30.00  ?  Pack years: 30.00  ?  Types: Cigarettes  ? Smokeless tobacco: Never  ?Substance and Sexual Activity  ? Alcohol use: Yes  ?  Alcohol/week: 3.0 standard drinks  ?  Types: 3  Cans of beer per week  ? Drug use: No  ? Sexual activity: Not on file  ?Other Topics Concern  ? Not on file  ?Social History Narrative  ? Not on file  ? ?Social Determinants of Health  ? ?Financial Resource Strain: Not on file  ?Food Insecurity: Not on file  ?Transportation Needs: Not on file  ?Physical Activity: Not on file  ?Stress: Not on file  ?Social Connections: Not on file  ?Intimate Partner Violence: Not on file  ? ? ?No family history on file. ? ?ROS: Otherwise negative unless mentioned in  HPI ? ?Physical Examination ? ?Vitals:  ? 12/26/21 2000 12/26/21 2030  ?BP: (!) 167/95 (!) 206/131  ?Pulse: 60 64  ?Resp: 20 20  ?Temp:    ?SpO2: 95% 95%  ? ?Body mass index is 20.6 kg/m?. ? ?General:  WDWN in NAD ?Gait: Not observed ?HENT: WNL, normocephalic ?Pulmonary: normal non-labored breathing, without Rales, rhonchi,  wheezing ?Cardiac: regular ?Abdomen: soft, nontender to palpation. ?Skin: without rashes ?Vascular Exam/Pulses: Nonpalpable pedal pulses ?Extremities: without ischemic changes, without Gangrene , without cellulitis; without open wounds;  ?Musculoskeletal: no muscle wasting or atrophy  ?Neurologic: A&O X 3;  No focal weakness or paresthesias are detected; speech is fluent/normal ?Psychiatric:  The pt has Normal affect. ?Lymph:  Unremarkable ? ?CBC ?   ?Component Value Date/Time  ? WBC 11.6 (H) 12/26/2021 1519  ? RBC 4.91 12/26/2021 1519  ? HGB 16.0 (H) 12/26/2021 1519  ? HCT 47.1 (H) 12/26/2021 1519  ? PLT 268 12/26/2021 1519  ? MCV 95.9 12/26/2021 1519  ? MCH 32.6 12/26/2021 1519  ? MCHC 34.0 12/26/2021 1519  ? RDW 14.4 12/26/2021 1519  ? LYMPHSABS 2.2 08/14/2018 0207  ? MONOABS 0.6 08/14/2018 0207  ? EOSABS 0.0 08/14/2018 0207  ? BASOSABS 0.1 08/14/2018 0207  ? ? ?BMET ?   ?Component Value Date/Time  ? NA 136 12/26/2021 1519  ? K 3.8 12/26/2021 1519  ? CL 101 12/26/2021 1519  ? CO2 20 (L) 12/26/2021 1519  ? GLUCOSE 103 (H) 12/26/2021 1519  ? BUN 23 12/26/2021 1519  ? CREATININE 0.68 12/26/2021 1519  ? CALCIUM 9.8 12/26/2021 1519  ? GFRNONAA >60 12/26/2021 1519  ? GFRAA >60 08/16/2018 0302  ? ? ?COAGS: ?No results found for: INR, PROTIME ? ? ?Non-Invasive Vascular Imaging:   ?CT angio demonstrates focal area of occlusion with distal reconstitution ? ? ? ?ASSESSMENT/PLAN: This is a 66 y.o. 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  Fortunately, her abdominal exam is benign, lactate normal.   ? ?I had a long conversation with  [name] regarding mesenteric ischemia, and specifically treatment modalities associated with this diagnosis.  The level of occlusion is atypical as it is distal involving jejunal branches.  The middle colic artery appears occluded.  The IMA is not appreciated.  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  There is distal filling of the mesentery which is likely why she a benign abdominal exam.  She describes the subjective abdominal pain is being 3 out of 10. ? ?Fathima would benefit from systemic anticoagulation.  Being that this pain has been present for 2 weeks, there is no frank abdominal pain to palpation, no elevated lactate, and distal filling is appreciated, Andreal does not require emergent revascularization.  I would like to trial her on systemic anticoagulation-heparin.  I will continue to monitor her symptoms.  We discussed that she will likely require operative intervention being percutaneous thrombectomy.  We briefly discussed the risks including the high morbidity and mortality associated. ? ?I will continue this discussion with her tomorrow and I will make operative time available should this be required.  She was asked to notify her nurse immediately should she have significant worsening in her pain, or should the pain be present with abdominal palpation.   ? ?Recommend: ?Heparin gtt ?ASA 81mg  ?mIVF ?Labs every 12 hours  ?Transfer to ? ?Will follow closely. ? ? ?J. [employer] MD MS ?[facility] ?Gillis Santa ?12/26/2021  ?9:32 PM ? ?

## 2021-12-26 NOTE — Progress Notes (Signed)
ANTICOAGULATION CONSULT NOTE - Initial Consult ? ?Pharmacy Consult for Heparin ?Indication: Mesenteric ischemia ? ?No Known Allergies ? ?Patient Measurements: ?Height: 5\' 4"  (162.6 cm) ?Weight: 54.4 kg (120 lb) ?IBW/kg (Calculated) : 54.7 ?Heparin Dosing Weight: 54.4 kg ? ?Vital Signs: ?Temp: 98.3 ?F (36.8 ?C) (04/08 1748) ?Temp Source: Oral (04/08 1748) ?BP: 167/95 (04/08 2000) ?Pulse Rate: 60 (04/08 2000) ? ?Labs: ?Recent Labs  ?  12/26/21 ?1519  ?HGB 16.0*  ?HCT 47.1*  ?PLT 268  ?CREATININE 0.68  ? ? ?Estimated Creatinine Clearance: 61 mL/min (by C-G formula based on SCr of 0.68 mg/dL). ? ? ?Medical History: ?Past Medical History:  ?Diagnosis Date  ? Hypertension   ? PVD (peripheral vascular disease) (HCC)   ? ? ?Assessment: ?Active Problem(s): Lower abdominal pain and NVD ? ?AC/Heme: Mesenteric ischemia. ?- Hgb WNL,Plts WNL ? ?Goal of Therapy:  ?Heparin level 0.3-0.7 units/ml ?Monitor platelets by anticoagulation protocol: Yes ?  ?Plan:  ?Heparin 3000 unit IV bolus ?Heparin infusion 700 units/hr ?Daily HL and CBC ? ?Garland Smouse S. 02/25/22, PharmD, BCPS ?Clinical Staff Pharmacist [email or web address] ?Merilynn Finland, Merilynn Finland ?12/26/2021,8:59 PM ? ? ?

## 2021-12-26 NOTE — Care Management (Signed)
TOC consult for PCP, [facility] placed on AVS to call first thing Monday and make an appointment to establish a PCP ?

## 2021-12-27 DIAGNOSIS — K559 Vascular disorder of intestine, unspecified: Secondary | ICD-10-CM | POA: Diagnosis not present

## 2021-12-27 LAB — COMPREHENSIVE METABOLIC PANEL
ALT: 7 U/L (ref 0–44)
AST: 11 U/L — ABNORMAL LOW (ref 15–41)
Albumin: 3.7 g/dL (ref 3.5–5.0)
Alkaline Phosphatase: 72 U/L (ref 38–126)
Anion gap: 10 (ref 5–15)
BUN: 18 mg/dL (ref 8–23)
CO2: 22 mmol/L (ref 22–32)
Calcium: 8.6 mg/dL — ABNORMAL LOW (ref 8.9–10.3)
Chloride: 109 mmol/L (ref 98–111)
Creatinine, Ser: 0.38 mg/dL — ABNORMAL LOW (ref 0.44–1.00)
GFR, Estimated: >60 mL/min (ref >60)
Glucose, Bld: 85 mg/dL (ref 70–99)
Potassium: 3.2 mmol/L — ABNORMAL LOW (ref 3.5–5.1)
Sodium: 141 mmol/L (ref 135–145)
Total Bilirubin: 0.5 mg/dL (ref 0.3–1.2)
Total Protein: 7.1 g/dL (ref 6.5–8.1)

## 2021-12-27 LAB — CBC
HCT: 41.6 % (ref 36.0–46.0)
Hemoglobin: 14.3 g/dL (ref 12.0–15.0)
MCH: 33.2 pg (ref 26.0–34.0)
MCHC: 34.4 g/dL (ref 30.0–36.0)
MCV: 96.5 fL (ref 80.0–100.0)
Platelets: 254 10*3/uL (ref 150–400)
RBC: 4.31 MIL/uL (ref 3.87–5.11)
RDW: 14.3 % (ref 11.5–15.5)
WBC: 11.9 10*3/uL — ABNORMAL HIGH (ref 4.0–10.5)
nRBC: 0 % (ref 0.0–0.2)

## 2021-12-27 LAB — HIV ANTIBODY (ROUTINE TESTING W REFLEX): HIV Screen 4th Generation wRfx: NONREACTIVE

## 2021-12-27 LAB — HEPARIN LEVEL (UNFRACTIONATED)
Heparin Unfractionated: 0.53 IU/mL (ref 0.30–0.70)
Heparin Unfractionated: 0.41 IU/mL (ref 0.30–0.70)

## 2021-12-27 LAB — LACTIC ACID, PLASMA: Lactic Acid, Venous: 0.8 mmol/L (ref 0.5–1.9)

## 2021-12-27 MED ORDER — HYDRALAZINE HCL 20 MG/ML IJ SOLN
10.0000 mg | INTRAMUSCULAR | Status: DC | PRN
  Administered 2021-12-27 – 2021-12-28 (×5): 10 mg via INTRAVENOUS
  Filled 2021-12-27 (×4): qty 1

## 2021-12-27 MED ORDER — ONDANSETRON HCL 4 MG/2ML IJ SOLN
4.0000 mg | Freq: Once | INTRAMUSCULAR | Status: AC
  Administered 2021-12-27: 4 mg via INTRAVENOUS

## 2021-12-27 MED ORDER — POTASSIUM CHLORIDE CRYS ER 20 MEQ PO TBCR
40.0000 meq | EXTENDED_RELEASE_TABLET | ORAL | Status: AC
  Administered 2021-12-27 (×2): 40 meq via ORAL
  Filled 2021-12-27 (×2): qty 2

## 2021-12-27 MED ORDER — ONDANSETRON HCL 4 MG/2ML IJ SOLN
4.0000 mg | Freq: Four times a day (QID) | INTRAMUSCULAR | Status: DC | PRN
  Administered 2021-12-27 – 2021-12-28 (×2): 4 mg via INTRAVENOUS
  Filled 2021-12-27 (×3): qty 2

## 2021-12-27 NOTE — Progress Notes (Signed)
?  Daily Progress Note ? ?Subjective: ?Patient with significant improvement in abdominal pain with the initiation of stomach anticoagulation ?Denied abdominal pain on exam.  Smiling. ?Denied nausea, vomiting ? ?Objective: ?Vitals:  ? 12/27/21 1220 12/27/21 1333  ?BP: (!) 170/74 (!) 178/107  ?Pulse: 92 85  ?Resp: 19 20  ?Temp: 98.5 ?F (36.9 ?C)   ?SpO2: 95% 96%  ?  ?Physical Examination ?Soft abdomen, benign exam. ? ? ?ASSESSMENT/PLAN:  ?This is a 66 y.o. 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  Abdominal exam remains benign, lactate normal.  ?  ?I had a long conversation with Danielle Morton regarding mesenteric ischemia, and specifically treatment modalities associated with this diagnosis.  The level of occlusion is atypical as it is distal involving jejunal branches.  The middle colic artery appears occluded.  The IMA is not appreciated.  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  There is distal filling of the mesentery which is likely why she a benign abdominal exam.  ? ?I would like to trial Danielle Morton on a diet to assess postprandial symptoms.  I am happy she has had resolution of her abdominal pain with use of anticoagulation.  I am worried, that this will recur with p.o. intake.  Danielle Morton and I discussed operative intervention being percutaneous suction thrombectomy of the thrombus.  She is booked for the operating room tomorrow for possible mesenteric revascularization, however I will assess her symptoms tomorrow morning.  Please make n.p.o. at midnight. ? ?Appreciate the care from hospital medicine. ?  ?She was asked to notify her nurse immediately should she have significant worsening in her pain, or should the pain be present with abdominal palpation.   ?  ?Please continue: ?Heparin gtt ?ASA 81mg  ?mIVF ?Labs every 12 hours  ?  ?Will follow  closely. ? ? ?J. MD MS ?[facility] ?[phone] ?12/27/2021  ?2:07 PM ? ?

## 2021-12-27 NOTE — ED Notes (Signed)
Son, Glenis Smoker would like to be notified to pt transfer when that occurs.  ?

## 2021-12-27 NOTE — Progress Notes (Signed)
Heart healthy diet was ordered for pt. Pt could not tolerate and is now complaining of abd pain. Hydralazine for a BP of 191/95, Dilaudid and a one time dose of Zofran(prn dose not available until 2 hours from now) will be given. Dr Kurtis Bushman and Virl Cagey both made aware. Dr. Virl Cagey also gave verbal order to make pt NPO now instead of midnight.  ? ?Addiel Mccardle M ? ?

## 2021-12-27 NOTE — Progress Notes (Signed)
?PROGRESS NOTE ? ? ? ?Danielle GleeSharon Delillo  AVW:[id number] DOB: 1955-09-26 DOA: 12/26/2021 ?PCP: Danielle Morton, Mary Ann, NP  ? ?Brief Narrative:  ? 66 y.o. female with medical history significant of hypertension, PAD status post femoral stenting and endarterectomy presented with ongoing abdominal pain.  On presentation, blood pressure was in the 160s to 200s systolic.  Lactic acid and lipase were normal.  CTA of the abdomen and pelvis: advanced atherosclerosis of the aorta, moderate stenosis of the SMA, no vascular congestion or wall thickening of the mesentery.  Status post left common iliac stenting, moderate to severe disease of the internal iliacs noted with occlusions bilaterally.  Also with occlusion of superficial femoral arteries.  Vascular surgery was consulted.  She was started on heparin drip. ? ?Assessment & Plan: ?  ?Mesenteric ischemia ?PAD status post femoral stenting and femoral endarterectomy ?-Presented with worsening ongoing abdominal pain.  CT angio on presentation showed findings as above. ?-Vascular surgery following and [facility]  Awaiting transfer.  Continue heparin drip.  Abdominal pain improving.  Continue n.p.o. for now.  Continue aspirin. ? ?Leukocytosis ?-Possibly reactive ? ?Hypokalemia ?-Replace.  Repeat a.m. labs ? ?Hypertension ?-Blood pressure still extremely elevated.  Use IV hydralazine as needed. ? ? ?DVT prophylaxis: Heparin drip ?Code Status: Full ?Family Communication: None at bedside ?Disposition Plan: ?Status is: Observation ?The patient will require care spanning > 2 midnights and should be moved to inpatient because: Of need for IV heparin.  Might need vascular surgery intervention ? ? ? ?Consultants: Vascular surgery ? ?Procedures: None ? ?Antimicrobials: None ? ? ?Subjective: ?Patient seen and examined at bedside.  Abdominal pain is better this morning.  Denies any current nausea or vomiting.  No overnight fever reported. ? ?Objective: ?Vitals:  ?  12/27/21 0615 12/27/21 0745 12/27/21 0845 12/27/21 0857  ?BP: (!) 188/78 (!) 192/93 (!) 199/67   ?Pulse: (!) 51 73  91  ?Resp: (!) 21 18  (!) 23  ?Temp:      ?TempSrc:      ?SpO2: 92% 95%  96%  ?Weight:      ?Height:      ? ?No intake or output data in the 24 hours ending 12/27/21 0925 ?Filed Weights  ? 12/26/21 2049  ?Weight: 54.4 kg  ? ? ?Examination: ? ?General exam: Appears calm and comfortable.  Currently on room air. ?Respiratory system: Bilateral decreased breath sounds at bases, intermittently tachypneic ?Cardiovascular system: S1 & S2 heard, Rate controlled ?Gastrointestinal system: Abdomen is mildly distended, soft and mild tenderness present, no rebound tenderness.  Normal bowel sounds heard. ?Extremities: No cyanosis, clubbing, edema  ?Central nervous system: Alert and oriented. No focal neurological deficits. Moving extremities ?Skin: No rashes, lesions or ulcers ?Psychiatry: Affect is mostly flat.  No signs of agitation. ? ?Data Reviewed: I have personally reviewed following labs and imaging studies ? ?CBC: ?Recent Labs  ?Lab 12/26/21 ?1519 12/27/21 ?0445  ?WBC 11.6* 11.9*  ?HGB 16.0* 14.3  ?HCT 47.1* 41.6  ?MCV 95.9 96.5  ?PLT 268 254  ? ?Basic Metabolic Panel: ?Recent Labs  ?Lab 12/26/21 ?1519 12/27/21 ?0445  ?NA 136 141  ?K 3.8 3.2*  ?CL 101 109  ?CO2 20* 22  ?GLUCOSE 103* 85  ?BUN 23 18  ?CREATININE 0.68 0.38*  ?CALCIUM 9.8 8.6*  ? ?GFR: ?Estimated Creatinine Clearance: 61 mL/min (A) (by C-G formula based on SCr of 0.38 mg/dL (L)). ?Liver Function Tests: ?Recent Labs  ?Lab 12/26/21 ?1519 12/27/21 ?0445  ?AST 20 11*  ?  ALT 10 7  ?ALKPHOS 83 72  ?BILITOT 1.0 0.5  ?PROT 8.5* 7.1  ?ALBUMIN 4.5 3.7  ? ?Recent Labs  ?Lab 12/26/21 ?1519  ?LIPASE 28  ? ?No results for input(s): AMMONIA in the last 168 hours. ?Coagulation Profile: ?No results for input(s): INR, PROTIME in the last 168 hours. ?Cardiac Enzymes: ?No results for input(s): CKTOTAL, CKMB, CKMBINDEX, TROPONINI in the last 168 hours. ?BNP (last 3  results) ?No results for input(s): PROBNP in the last 8760 hours. ?HbA1C: ?No results for input(s): HGBA1C in the last 72 hours. ?CBG: ?No results for input(s): GLUCAP in the last 168 hours. ?Lipid Profile: ?No results for input(s): CHOL, HDL, LDLCALC, TRIG, CHOLHDL, LDLDIRECT in the last 72 hours. ?Thyroid Function Tests: ?No results for input(s): TSH, T4TOTAL, FREET4, T3FREE, THYROIDAB in the last 72 hours. ?Anemia Panel: ?No results for input(s): VITAMINB12, FOLATE, FERRITIN, TIBC, IRON, RETICCTPCT in the last 72 hours. ?Sepsis Labs: ?Recent Labs  ?Lab 12/26/21 ?1940  ?LATICACIDVEN 0.8  ? ? ?No results found for this or any previous visit (from the past 240 hour(s)).  ? ? ? ? ? ?Radiology Studies: ?CT Angio Abd/Pel W and/or Wo Contrast ? ?Result Date: 12/26/2021 ?CLINICAL DATA:  Lower abdominal pain and vomiting EXAM: CTA ABDOMEN AND PELVIS WITHOUT AND WITH CONTRAST TECHNIQUE: Multidetector CT imaging of the abdomen and pelvis was performed using the standard protocol during bolus administration of intravenous contrast. Multiplanar reconstructed images and MIPs were obtained and reviewed to evaluate the vascular anatomy. RADIATION DOSE REDUCTION: This exam was performed according to the departmental dose-optimization program which includes automated exposure control, adjustment of the mA and/or kV according to patient size and/or use of iterative reconstruction technique. CONTRAST:  62mL OMNIPAQUE IOHEXOL 350 MG/ML SOLN COMPARISON:  CT angiography 08/14/2018 FINDINGS: VASCULAR Aorta: Moderate severe aortic atherosclerosis. No definitive aortic aneurysm. Moderate infrarenal circumferential mural thrombus results in slightly less than 50% luminal stenosis. No dissection is seen. Celiac: Calcification at the origin of the celiac with approximate 50% stenosis. Luminal irregularity of the distal celiac artery at the bifurcation of the common hepatic artery. The splenic artery appears occluded. SMA: Patent at the origin.  Moderate luminal stenosis of the superior mesenteric artery about 2 cm distal to the origin with 6 mm segmental occlusion at the bifurcation of mesenteric branch vessels. Flow enhancement is seen distal to this. Renals: Single right and single left renal arteries. Moderate severe focal stenosis of the proximal right renal artery less than a cm distal to the origin. Left renal artery appears patent and without aneurysm or dissection. IMA: Chronic occlusion of the IMA. Diminutive appearing distal branch vessels with suboptimal flow enhancement. Inflow: Moderate severe aortic atherosclerosis. Interim stenting of the left common iliac artery which appears patent. Chronically occluded left internal iliac artery. Moderate diffuse disease of left external iliac artery without occlusion. Moderate severe diffuse disease of the right external iliac artery with distal occlusion. The right external iliac artery shows moderate disease with mild to moderate focal stenosis of the right mid external iliac artery. Proximal Outflow: Moderate disease of the right common femoral artery with slightly greater than 50% stenosis of the right common femoral artery. Right superficial femoral artery occludes slightly distal to its origin. Moderate severe stenosis of the deep femoral artery several cm distal to the origin. 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 The superficial femoral artery occludes just distal to its origin. Veins: No obvious venous abnormality within the limitations of this arterial phase study. Review  of the MIP images confirms the above findings. NON-VASCULAR Lower chest: Lung bases demonstrate no acute consolidation or effusion. Scarring or atelectasis at the right middle lobe. Hepatobiliary: No focal liver abnormality is seen. No gallstones, gallbladder wall thickening, or biliary dilatation. Pancreas: Unremarkable. No pancreatic ductal dilatation or surrounding inflammatory changes. Spleen:  Normal in size without focal abnormality. Adrenals/Urinary Tract: Adrenal glands are unremarkable. Kidneys are normal, without renal calculi, focal lesion, or hydronephrosis. Bladder is unremarkable. Stomach/B

## 2021-12-27 NOTE — Progress Notes (Signed)
ANTICOAGULATION CONSULT NOTE - Follow Up Consult ? ?Pharmacy Consult for Heparin ?Indication: Mesenteric ischemia ? ?No Known Allergies ? ?Patient Measurements: ?Height: 5\' 4"  (162.6 cm) ?Weight: 54.4 kg (120 lb) ?IBW/kg (Calculated) : 54.7 ?Heparin Dosing Weight: TBW ? ?Vital Signs: ?BP: 192/93 (04/09 0745) ?Pulse Rate: 73 (04/09 0745) ? ?Labs: ?Recent Labs  ?  12/26/21 ?1519 12/27/21 ?0445  ?HGB 16.0* 14.3  ?HCT 47.1* 41.6  ?PLT 268 254  ?HEPARINUNFRC  --  0.53  ?CREATININE 0.68 0.38*  ? ? ?Estimated Creatinine Clearance: 61 mL/min (A) (by C-G formula based on SCr of 0.38 mg/dL (L)). ? ? ?Medications:  ?Infusions:  ? heparin 700 Units/hr (12/26/21 2149)  ? ? ?Assessment: ?66 y.o. female with known peripheral arterial disease and history of common femoral endarterectomy with iliac stenting in 2019.  She presented to ED on 4/8 with abdominal pain and CTa imaging demonstrating occlusion.  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   ? ?Today, 12/27/2021: ?Confirmatory Heparin level 0.41, remains therapeutic ?CBC: Hgb and Plt WNL ?No bleeding or complications reported.  No IV interruptions or pauses reported.  ? ?Goal of Therapy:  ?Heparin level 0.3-0.7 units/ml ?Monitor platelets by anticoagulation protocol: Yes ?  ?Plan:  ?Continue heparin IV infusion at 700 units/hr ?Daily heparin level and CBC ?Awaiting transfer for [facility].  Follow up vascular surgery plans/timing for possible percutaneous thrombectomy. ? ? ?[facility] PharmD, BCPS ?Clinical Pharmacist ?[facility] [phone] ?12/27/2021 11:27 AM ? ? ? ?

## 2021-12-27 NOTE — ED Notes (Signed)
Report given to [facility] Daily, RN at [facility]. [facility] called for transport needs.  ?

## 2021-12-27 NOTE — Plan of Care (Signed)
  Problem: Education: Goal: Understanding of CV disease, CV risk reduction, and recovery process will improve Outcome: Progressing Goal: Individualized Educational Video(s) Outcome: Progressing   

## 2021-12-27 NOTE — Progress Notes (Signed)
ANTICOAGULATION CONSULT NOTE - follow up ? ?Pharmacy Consult for Heparin ?Indication: Mesenteric ischemia ? ?No Known Allergies ? ?Patient Measurements: ?Height: 5\' 4"  (162.6 cm) ?Weight: 54.4 kg (120 lb) ?IBW/kg (Calculated) : 54.7 ?Heparin Dosing Weight: 54.4 kg ? ?Vital Signs: ?BP: 192/111 (04/09 0200) ?Pulse Rate: 96 (04/09 0500) ? ?Labs: ?Recent Labs  ?  12/26/21 ?1519 12/27/21 ?0445  ?HGB 16.0* 14.3  ?HCT 47.1* 41.6  ?PLT 268 254  ?HEPARINUNFRC  --  0.53  ?CREATININE 0.68  --   ? ? ? ?Estimated Creatinine Clearance: 61 mL/min (by C-G formula based on SCr of 0.68 mg/dL). ? ? ?Medical History: ?Past Medical History:  ?Diagnosis Date  ? Hypertension   ? PVD (peripheral vascular disease) ([place])   ? ? ?Assessment: ?Active Problem(s): Lower abdominal pain and NVD ? ?AC/Heme: Mesenteric ischemia. ?- Hgb WNL,Plts WNL ? ?12/27/2021 ?HL 0.53 therapeutic on 700 units.hr ?CBC WNL ?No bleeding reported ? ?Goal of Therapy:  ?Heparin level 0.3-0.7 units/ml ?Monitor platelets by anticoagulation protocol: Yes ?  ?Plan:  ?Continue Heparin infusion at 700 units/hr ?Confirmatory level in 6 hours ?Daily HL and CBC ? ?Dolly Rias RPh ?12/27/2021, 6:07 AM ? ?

## 2021-12-28 ENCOUNTER — Inpatient Hospital Stay (HOSPITAL_COMMUNITY): Payer: Self-pay | Admitting: Certified Registered Nurse Anesthetist

## 2021-12-28 ENCOUNTER — Inpatient Hospital Stay (HOSPITAL_COMMUNITY): Payer: Self-pay

## 2021-12-28 ENCOUNTER — Other Ambulatory Visit: Payer: Self-pay

## 2021-12-28 ENCOUNTER — Encounter (HOSPITAL_COMMUNITY)
Admission: EM | Disposition: A | Discharge status: Home / Self Care | Payer: Self-pay | Source: Home / Self Care | Attending: Internal Medicine

## 2021-12-28 ENCOUNTER — Encounter (HOSPITAL_COMMUNITY): Payer: Self-pay | Admitting: Internal Medicine

## 2021-12-28 DIAGNOSIS — K55059 Acute (reversible) ischemia of intestine, part and extent unspecified: Secondary | ICD-10-CM

## 2021-12-28 DIAGNOSIS — K559 Vascular disorder of intestine, unspecified: Secondary | ICD-10-CM | POA: Diagnosis not present

## 2021-12-28 DIAGNOSIS — I10 Essential (primary) hypertension: Secondary | ICD-10-CM

## 2021-12-28 DIAGNOSIS — I739 Peripheral vascular disease, unspecified: Secondary | ICD-10-CM

## 2021-12-28 HISTORY — PX: MESENTERIC ARTERY BYPASS: SHX5968

## 2021-12-28 HISTORY — PX: ANGIOPLASTY: SHX39

## 2021-12-28 LAB — BASIC METABOLIC PANEL
Anion gap: 9 (ref 5–15)
BUN: 11 mg/dL (ref 8–23)
CO2: 20 mmol/L — ABNORMAL LOW (ref 22–32)
Calcium: 9.6 mg/dL (ref 8.9–10.3)
Chloride: 106 mmol/L (ref 98–111)
Creatinine, Ser: 0.54 mg/dL (ref 0.44–1.00)
GFR, Estimated: >60 mL/min (ref >60)
Glucose, Bld: 105 mg/dL — ABNORMAL HIGH (ref 70–99)
Potassium: 4.1 mmol/L (ref 3.5–5.1)
Sodium: 135 mmol/L (ref 135–145)

## 2021-12-28 LAB — CBC
HCT: 39.7 % (ref 36.0–46.0)
Hemoglobin: 13.6 g/dL (ref 12.0–15.0)
MCH: 32.9 pg (ref 26.0–34.0)
MCHC: 34.3 g/dL (ref 30.0–36.0)
MCV: 95.9 fL (ref 80.0–100.0)
Platelets: 236 10*3/uL (ref 150–400)
RBC: 4.14 MIL/uL (ref 3.87–5.11)
RDW: 14.3 % (ref 11.5–15.5)
WBC: 13.6 10*3/uL — ABNORMAL HIGH (ref 4.0–10.5)
nRBC: 0 % (ref 0.0–0.2)

## 2021-12-28 LAB — SURGICAL PCR SCREEN
MRSA, PCR: NEGATIVE
Staphylococcus aureus: NEGATIVE

## 2021-12-28 LAB — MAGNESIUM: Magnesium: 2.2 mg/dL (ref 1.7–2.4)

## 2021-12-28 LAB — ABO/RH: ABO/RH(D): A POS

## 2021-12-28 LAB — TYPE AND SCREEN
ABO/RH(D): A POS
Antibody Screen: NEGATIVE

## 2021-12-28 LAB — HEPARIN LEVEL (UNFRACTIONATED): Heparin Unfractionated: 0.67 IU/mL (ref 0.30–0.70)

## 2021-12-28 SURGERY — CREATION, BYPASS, ARTERIAL, MESENTERIC
Anesthesia: General | Site: Arm Upper

## 2021-12-28 MED ORDER — FENTANYL CITRATE (PF) 250 MCG/5ML IJ SOLN
INTRAMUSCULAR | Status: DC | PRN
  Administered 2021-12-28 (×3): 50 ug via INTRAVENOUS

## 2021-12-28 MED ORDER — LABETALOL HCL 5 MG/ML IV SOLN
INTRAVENOUS | Status: AC
  Filled 2021-12-28: qty 4

## 2021-12-28 MED ORDER — ONDANSETRON HCL 4 MG/2ML IJ SOLN
INTRAMUSCULAR | Status: AC
  Filled 2021-12-28: qty 4

## 2021-12-28 MED ORDER — DEXAMETHASONE SODIUM PHOSPHATE 10 MG/ML IJ SOLN
INTRAMUSCULAR | Status: DC | PRN
  Administered 2021-12-28: 10 mg via INTRAVENOUS

## 2021-12-28 MED ORDER — DEXAMETHASONE SODIUM PHOSPHATE 10 MG/ML IJ SOLN
INTRAMUSCULAR | Status: AC
  Filled 2021-12-28: qty 1

## 2021-12-28 MED ORDER — HEPARIN (PORCINE) 25000 UT/250ML-% IV SOLN
700.0000 [IU]/h | INTRAVENOUS | Status: DC
  Administered 2021-12-28: 700 [IU]/h via INTRAVENOUS

## 2021-12-28 MED ORDER — LIDOCAINE 2% (20 MG/ML) 5 ML SYRINGE
INTRAMUSCULAR | Status: DC | PRN
  Administered 2021-12-28: 75 mg via INTRAVENOUS

## 2021-12-28 MED ORDER — PROPOFOL 10 MG/ML IV BOLUS
INTRAVENOUS | Status: DC | PRN
  Administered 2021-12-28: 80 mg via INTRAVENOUS

## 2021-12-28 MED ORDER — 0.9 % SODIUM CHLORIDE (POUR BTL) OPTIME
TOPICAL | Status: DC | PRN
  Administered 2021-12-28: 1000 mL

## 2021-12-28 MED ORDER — ACETAMINOPHEN 500 MG PO TABS
1000.0000 mg | ORAL_TABLET | Freq: Once | ORAL | Status: AC
  Administered 2021-12-28: 1000 mg via ORAL
  Filled 2021-12-28: qty 2

## 2021-12-28 MED ORDER — ONDANSETRON HCL 4 MG/2ML IJ SOLN
INTRAMUSCULAR | Status: DC | PRN
  Administered 2021-12-28: 4 mg via INTRAVENOUS

## 2021-12-28 MED ORDER — CEFAZOLIN SODIUM-DEXTROSE 2-3 GM-%(50ML) IV SOLR
INTRAVENOUS | Status: DC | PRN
  Administered 2021-12-28: 2 g via INTRAVENOUS

## 2021-12-28 MED ORDER — HEPARIN 6000 UNIT IRRIGATION SOLUTION
Status: AC
Start: 2021-12-28 — End: ?
  Filled 2021-12-28: qty 500

## 2021-12-28 MED ORDER — LACTATED RINGERS IV SOLN: INTRAVENOUS | Status: DC

## 2021-12-28 MED ORDER — IODIXANOL 320 MG/ML IV SOLN
INTRAVENOUS | Status: DC | PRN
  Administered 2021-12-28: 120 mL via INTRAVENOUS

## 2021-12-28 MED ORDER — CELECOXIB 200 MG PO CAPS
200.0000 mg | ORAL_CAPSULE | Freq: Once | ORAL | Status: AC
  Administered 2021-12-28: 200 mg via ORAL
  Filled 2021-12-28: qty 1

## 2021-12-28 MED ORDER — HEPARIN SODIUM (PORCINE) 1000 UNIT/ML IJ SOLN
INTRAMUSCULAR | Status: DC | PRN
Start: 2021-12-28 — End: 2021-12-28
  Administered 2021-12-28 (×3): 2000 [IU] via INTRAVENOUS
  Administered 2021-12-28 (×2): 3000 [IU] via INTRAVENOUS

## 2021-12-28 MED ORDER — LIDOCAINE 2% (20 MG/ML) 5 ML SYRINGE
INTRAMUSCULAR | Status: AC
  Filled 2021-12-28: qty 10

## 2021-12-28 MED ORDER — CHLORHEXIDINE GLUCONATE 0.12 % MT SOLN: 15.0000 mL | Freq: Once | OROMUCOSAL | Status: AC

## 2021-12-28 MED ORDER — FENTANYL CITRATE (PF) 250 MCG/5ML IJ SOLN
INTRAMUSCULAR | Status: AC
  Filled 2021-12-28: qty 5

## 2021-12-28 MED ORDER — MUPIROCIN 2 % EX OINT
1.0000 "application " | TOPICAL_OINTMENT | Freq: Two times a day (BID) | CUTANEOUS | Status: DC
  Filled 2021-12-28: qty 22

## 2021-12-28 MED ORDER — CHLORHEXIDINE GLUCONATE 0.12 % MT SOLN
OROMUCOSAL | Status: AC
  Administered 2021-12-28: 15 mL via OROMUCOSAL
  Filled 2021-12-28: qty 15

## 2021-12-28 MED ORDER — CEFAZOLIN SODIUM 1 G IJ SOLR
INTRAMUSCULAR | Status: AC
  Filled 2021-12-28: qty 20

## 2021-12-28 MED ORDER — HEMOSTATIC AGENTS (NO CHARGE) OPTIME
TOPICAL | Status: DC | PRN
  Administered 2021-12-28: 1 via TOPICAL

## 2021-12-28 MED ORDER — LABETALOL HCL 5 MG/ML IV SOLN
INTRAVENOUS | Status: DC | PRN
Start: 2021-12-28 — End: 2021-12-28
  Administered 2021-12-28: 10 mg via INTRAVENOUS
  Administered 2021-12-28: 5 mg via INTRAVENOUS

## 2021-12-28 MED ORDER — FENTANYL CITRATE (PF) 100 MCG/2ML IJ SOLN
25.0000 ug | INTRAMUSCULAR | Status: DC | PRN
  Administered 2021-12-28: 25 ug via INTRAVENOUS

## 2021-12-28 MED ORDER — AMISULPRIDE (ANTIEMETIC) 5 MG/2ML IV SOLN: 10.0000 mg | Freq: Once | INTRAVENOUS | Status: DC | PRN

## 2021-12-28 MED ORDER — ORAL CARE MOUTH RINSE: 15.0000 mL | Freq: Once | OROMUCOSAL | Status: AC

## 2021-12-28 MED ORDER — HEPARIN 6000 UNIT IRRIGATION SOLUTION
Status: DC | PRN
  Administered 2021-12-28: 1

## 2021-12-28 MED ORDER — ROCURONIUM BROMIDE 10 MG/ML (PF) SYRINGE
PREFILLED_SYRINGE | INTRAVENOUS | Status: DC | PRN
  Administered 2021-12-28: 20 mg via INTRAVENOUS
  Administered 2021-12-28: 60 mg via INTRAVENOUS

## 2021-12-28 MED ORDER — SUGAMMADEX SODIUM 200 MG/2ML IV SOLN
INTRAVENOUS | Status: DC | PRN
  Administered 2021-12-28: 108 mg via INTRAVENOUS

## 2021-12-28 MED ORDER — HEPARIN SODIUM (PORCINE) 1000 UNIT/ML IJ SOLN
INTRAMUSCULAR | Status: AC
  Filled 2021-12-28: qty 20

## 2021-12-28 MED ORDER — FENTANYL CITRATE (PF) 100 MCG/2ML IJ SOLN
INTRAMUSCULAR | Status: AC
  Filled 2021-12-28: qty 2

## 2021-12-28 SURGICAL SUPPLY — 75 items
ADH SKN CLS APL DERMABOND .7 (GAUZE/BANDAGES/DRESSINGS) ×3
BAG COUNTER SPONGE SURGICOUNT (BAG) ×4 IMPLANT
BAG SPNG CNTER NS LX DISP (BAG)
BALLN COYOTE ES OTW 3X40X145 (BALLOONS) ×4
BALLOON COYOTE ES OTW 3X40X145 (BALLOONS) IMPLANT
BNDG ELASTIC 6X5.8 VLCR STR LF (GAUZE/BANDAGES/DRESSINGS) ×1 IMPLANT
CANISTER PENUMBRA ENGINE (MISCELLANEOUS) ×1 IMPLANT
CANISTER SUCT 3000ML PPV (MISCELLANEOUS) ×5 IMPLANT
CATH 0.018 NAVICROSS ANG 135 (CATHETERS) ×1 IMPLANT
CATH ACCU-VU SIZ PIG 5F 100CM (CATHETERS) ×1 IMPLANT
CATH INDIGO CAT6 KIT (CATHETERS) IMPLANT
CATH LIGHTNING 7 XTORQ 130 (CATHETERS) ×1 IMPLANT
DERMABOND ADVANCED (GAUZE/BANDAGES/DRESSINGS) ×1
DERMABOND ADVANCED .7 DNX12 (GAUZE/BANDAGES/DRESSINGS) IMPLANT
DRAPE BRACHIAL (DRAPES) ×1 IMPLANT
DRAPE C-ARM 42X72 X-RAY (DRAPES) ×2 IMPLANT
DRAPE ORTHO SPLIT 77X108 STRL (DRAPES) ×4
DRAPE SURG ORHT 6 SPLT 77X108 (DRAPES) IMPLANT
ELECT BLADE 4.0 EZ CLEAN MEGAD (MISCELLANEOUS)
ELECT BLADE 6.5 EXT (BLADE) IMPLANT
ELECT CAUTERY BLADE 6.4 (BLADE) ×1 IMPLANT
ELECT REM PT RETURN 9FT ADLT (ELECTROSURGICAL) ×4
ELECTRODE BLDE 4.0 EZ CLN MEGD (MISCELLANEOUS) ×4 IMPLANT
ELECTRODE REM PT RTRN 9FT ADLT (ELECTROSURGICAL) ×4 IMPLANT
GLIDEWIRE ADV .035X180CM (WIRE) ×2 IMPLANT
GLOVE SRG 8 PF TXTR STRL LF DI (GLOVE) ×8 IMPLANT
GLOVE SURG POLYISO LF SZ8 (GLOVE) IMPLANT
GLOVE SURG UNDER POLY LF SZ8 (GLOVE) ×8
GOWN STRL REUS W/ TWL LRG LVL3 (GOWN DISPOSABLE) ×12 IMPLANT
GOWN STRL REUS W/TWL 2XL LVL3 (GOWN DISPOSABLE) ×5 IMPLANT
GOWN STRL REUS W/TWL LRG LVL3 (GOWN DISPOSABLE) ×12
GUIDEWIRE BENTSON (WIRE) ×1 IMPLANT
HEMOSTAT SNOW SURGICEL 2X4 (HEMOSTASIS) ×1 IMPLANT
INSERT FOGARTY 61MM (MISCELLANEOUS) ×4 IMPLANT
INSERT FOGARTY SM (MISCELLANEOUS) ×8 IMPLANT
KIT BASIN OR (CUSTOM PROCEDURE TRAY) ×5 IMPLANT
KIT ENCORE 26 ADVANTAGE (KITS) ×1 IMPLANT
KIT MICROPUNCTURE NIT STIFF (SHEATH) ×1 IMPLANT
KIT TURNOVER KIT B (KITS) ×4 IMPLANT
LOOP VESSEL MAXI BLUE (MISCELLANEOUS) ×1 IMPLANT
LOOP VESSEL MINI RED (MISCELLANEOUS) ×1 IMPLANT
NS IRRIG 1000ML POUR BTL (IV SOLUTION) ×8 IMPLANT
PACK AORTA (CUSTOM PROCEDURE TRAY) ×5 IMPLANT
PAD ARMBOARD 7.5X6 YLW CONV (MISCELLANEOUS) ×10 IMPLANT
PENCIL BUTTON HOLSTER BLD 10FT (ELECTRODE) ×1 IMPLANT
RETAINER VISCERA MED (MISCELLANEOUS) ×4 IMPLANT
SHEATH HIGHFLEX ANSEL 7FR 55CM (SHEATH) ×1 IMPLANT
SHEATH PINNACLE 5F 10CM (SHEATH) ×1 IMPLANT
STAPLER VISISTAT 35W (STAPLE) ×8 IMPLANT
STOPCOCK MORSE 400PSI 3WAY (MISCELLANEOUS) ×2 IMPLANT
SUT ETHIBOND 5 LR DA (SUTURE) IMPLANT
SUT MNCRL AB 4-0 PS2 18 (SUTURE) ×1 IMPLANT
SUT PDS AB 1 TP1 96 (SUTURE) ×8 IMPLANT
SUT PROLENE 2 0 MH 48 (SUTURE) IMPLANT
SUT PROLENE 3 0 SH 48 (SUTURE) IMPLANT
SUT PROLENE 3 0 SH1 36 (SUTURE) IMPLANT
SUT PROLENE 5 0 C 1 24 (SUTURE) IMPLANT
SUT PROLENE 5 0 C 1 36 (SUTURE) IMPLANT
SUT PROLENE 6 0 BV (SUTURE) ×5 IMPLANT
SUT SILK 2 0 (SUTURE) ×4
SUT SILK 2 0 SH CR/8 (SUTURE) ×5 IMPLANT
SUT SILK 2-0 18XBRD TIE 12 (SUTURE) IMPLANT
SUT SILK 3 0 (SUTURE) ×4
SUT SILK 3-0 18XBRD TIE 12 (SUTURE) IMPLANT
SUT VIC AB 2-0 CT1 36 (SUTURE) ×8 IMPLANT
SUT VIC AB 3-0 SH 27 (SUTURE) ×4
SUT VIC AB 3-0 SH 27X BRD (SUTURE) IMPLANT
TOWEL GREEN STERILE (TOWEL DISPOSABLE) ×5 IMPLANT
TOWEL ~~LOC~~+RFID 17X26 BLUE (SPONGE) ×8 IMPLANT
TRAY FOLEY MTR SLVR 16FR STAT (SET/KITS/TRAYS/PACK) ×5 IMPLANT
TUBING INJECTOR 48 (MISCELLANEOUS) ×1 IMPLANT
WATER STERILE IRR 1000ML POUR (IV SOLUTION) ×8 IMPLANT
WIRE G V18X300CM (WIRE) ×2 IMPLANT
WIRE SPARTACORE .014X300CM (WIRE) ×1 IMPLANT
WIRE TORQFLEX AUST .018X40CM (WIRE) ×1 IMPLANT

## 2021-12-28 NOTE — Progress Notes (Signed)
?  Daily Progress Note ? ?Subjective: ?No rest pain. 10/10 pain with PO intake yesterday, nausea ? ?Objective: ?Vitals:  ? 12/28/21 0722 12/28/21 0823  ?BP: (!) 167/90 (!) 169/91  ?Pulse: 66 (!) 52  ?Resp: 20 19  ?Temp: 98.6 ?F (37 ?C)   ?SpO2: 98% 94%  ?  ?Physical Examination ?Soft abdomen, benign exam. ? ? ?ASSESSMENT/PLAN:  ?This is a 66 y.o. 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  Abdominal exam remains benign, lactate normal.  ?  ?I had a long conversation with Danielle Morton regarding mesenteric ischemia, and specifically treatment modalities associated with this diagnosis.  The level of occlusion is atypical as it is distal involving jejunal branches.  The middle colic artery appears occluded.  The IMA is not appreciated.  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  There is distal filling of the mesentery which is likely why she a benign abdominal exam.  ? ? ?Camreigh failed PO intake yesterday due to the blockage. Her pathology can not be treated with conservative measures. Danielle Morton and I discussed operative intervention being percutaneous suction thrombectomy of the thrombus.  All risks and benefits associated with the case. ? ?She is booked for the operating room today. After discussing the risks and benefits of mesenteric revascularization, Waylon elected to proceed.  ? ?Appreciate the care from hospital medicine. ?  ?Continue heparin  ? ?J. Gillis Santa MD MS ?[facility] ?[phone] ?12/28/2021  ?9:30 AM ? ?

## 2021-12-28 NOTE — Progress Notes (Signed)
?PROGRESS NOTE ? ? ? ?Danielle Morton  HTD:[id number] DOB: 1956/06/10 DOA: 12/26/2021 ?PCP: Lavinia Sharps, NP  ? ?Brief Narrative:  ? 66 y.o. female with medical history significant of hypertension, PAD status post femoral stenting and endarterectomy presented with ongoing abdominal pain.  On presentation, blood pressure was in the 160s to 200s systolic.  Lactic acid and lipase were normal.  CTA of the abdomen and pelvis: advanced atherosclerosis of the aorta, moderate stenosis of the SMA, no vascular congestion or wall thickening of the mesentery.  Status post left common iliac stenting, moderate to severe disease of the internal iliacs noted with occlusions bilaterally.  Also with occlusion of superficial femoral arteries.  Vascular surgery was consulted.  She was started on heparin drip.  She failed conservative measures so plan for OR on 4/10. ? ? ?Assessment & Plan: ?  ?Mesenteric ischemia ?PAD status post femoral stenting and femoral endarterectomy ?-Presented with worsening ongoing abdominal pain.  CT angio on presentation showed findings as above. ?-Continue heparin drip ?-plan for OR on 4/10 ? ?Leukocytosis ?-continue to monitor ? ?Hypokalemia ?-Replaced ? ?Hypertension ?-meds as able ? ? ?DVT prophylaxis: Heparin drip ?Code Status: Full ?Disposition Plan: ?Status is: inpatient  ? ? ? ?Consultants: Vascular surgery ? ? ? ? ?Subjective: ?Asking for ice chips.  Had severe pain after food challenge yesterday ? ?Objective: ?Vitals:  ? 12/28/21 0514 12/28/21 7262 12/28/21 0355 12/28/21 0941  ?BP: 139/79 (!) 167/90 (!) 169/91 (!) 143/91  ?Pulse: 81 66 (!) 52 72  ?Resp: 17 20 19 18   ?Temp: 98.3 ?F (36.8 ?C) 98.6 ?F (37 ?C)  98.2 ?F (36.8 ?C)  ?TempSrc: Oral Oral  Oral  ?SpO2: 97% 98% 94% 98%  ?Weight:    54 kg  ?Height:    5\' 4"  (1.626 m)  ? ? ?Intake/Output Summary (Last 24 hours) at 12/28/2021 1006 ?Last data filed at 12/27/2021 1800 ?Gross per 24 hour  ?Intake 141.28 ml  ?Output --  ?Net 141.28 ml  ? ?Filed Weights   ? 12/26/21 2049 12/28/21 0941  ?Weight: 54.4 kg 54 kg  ? ? ?Examination: ? ? ?General: Appearance:    Thin female in no acute distress  ?   ?Lungs:     respirations unlabored  ?Heart:    Normal heart rate.  ?  ?MS:   All extremities are intact.  ?  ?Neurologic:   Awake, alert  ?  ? ?Data Reviewed: I have personally reviewed following labs and imaging studies ? ?CBC: ?Recent Labs  ?Lab 12/26/21 ?1519 12/27/21 ?0445 12/28/21 ?0350  ?WBC 11.6* 11.9* 13.6*  ?HGB 16.0* 14.3 13.6  ?HCT 47.1* 41.6 39.7  ?MCV 95.9 96.5 95.9  ?PLT 268 254 236  ? ?Basic Metabolic Panel: ?Recent Labs  ?Lab 12/26/21 ?1519 12/27/21 ?0445 12/28/21 ?0350  ?NA 136 141 135  ?K 3.8 3.2* 4.1  ?CL 101 109 106  ?CO2 20* 22 20*  ?GLUCOSE 103* 85 105*  ?BUN 23 18 11   ?CREATININE 0.68 0.38* 0.54  ?CALCIUM 9.8 8.6* 9.6  ?MG  --   --  2.2  ? ?GFR: ?Estimated Creatinine Clearance: 60.6 mL/min (by C-G formula based on SCr of 0.54 mg/dL). ?Liver Function Tests: ?Recent Labs  ?Lab 12/26/21 ?1519 12/27/21 ?0445  ?AST 20 11*  ?ALT 10 7  ?ALKPHOS 83 72  ?BILITOT 1.0 0.5  ?PROT 8.5* 7.1  ?ALBUMIN 4.5 3.7  ? ?Recent Labs  ?Lab 12/26/21 ?1519  ?LIPASE 28  ? ?No results for input(s): AMMONIA in the  last 168 hours. ?Coagulation Profile: ?No results for input(s): INR, PROTIME in the last 168 hours. ?Cardiac Enzymes: ?No results for input(s): CKTOTAL, CKMB, CKMBINDEX, TROPONINI in the last 168 hours. ?BNP (last 3 results) ?No results for input(s): PROBNP in the last 8760 hours. ?HbA1C: ?No results for input(s): HGBA1C in the last 72 hours. ?CBG: ?No results for input(s): GLUCAP in the last 168 hours. ?Lipid Profile: ?No results for input(s): CHOL, HDL, LDLCALC, TRIG, CHOLHDL, LDLDIRECT in the last 72 hours. ?Thyroid Function Tests: ?No results for input(s): TSH, T4TOTAL, FREET4, T3FREE, THYROIDAB in the last 72 hours. ?Anemia Panel: ?No results for input(s): VITAMINB12, FOLATE, FERRITIN, TIBC, IRON, RETICCTPCT in the last 72 hours. ?Sepsis Labs: ?Recent Labs  ?Lab  12/26/21 ?1940 12/27/21 ?1951  ?LATICACIDVEN 0.8 0.8  ? ? ?No results found for this or any previous visit (from the past 240 hour(s)).  ? ? ? ? ? ?Radiology Studies: ?HYBRID OR IMAGING ([facility] ONLY) ? ?Result Date: 12/28/2021 ?There is no interpretation for this exam.  This order is for images obtained during a surgical procedure.  Please See "Surgeries" Tab for more information regarding the procedure.  ? ?CT Angio Abd/Pel W and/or Wo Contrast ? ?Result Date: 12/26/2021 ?CLINICAL DATA:  Lower abdominal pain and vomiting EXAM: CTA ABDOMEN AND PELVIS WITHOUT AND WITH CONTRAST TECHNIQUE: Multidetector CT imaging of the abdomen and pelvis was performed using the standard protocol during bolus administration of intravenous contrast. Multiplanar reconstructed images and MIPs were obtained and reviewed to evaluate the vascular anatomy. RADIATION DOSE REDUCTION: This exam was performed according to the departmental dose-optimization program which includes automated exposure control, adjustment of the mA and/or kV according to patient size and/or use of iterative reconstruction technique. CONTRAST:  80mL OMNIPAQUE IOHEXOL 350 MG/ML SOLN COMPARISON:  CT angiography 08/14/2018 FINDINGS: VASCULAR Aorta: Moderate severe aortic atherosclerosis. No definitive aortic aneurysm. Moderate infrarenal circumferential mural thrombus results in slightly less than 50% luminal stenosis. No dissection is seen. Celiac: Calcification at the origin of the celiac with approximate 50% stenosis. Luminal irregularity of the distal celiac artery at the bifurcation of the common hepatic artery. The splenic artery appears occluded. SMA: Patent at the origin. Moderate luminal stenosis of the superior mesenteric artery about 2 cm distal to the origin with 6 mm segmental occlusion at the bifurcation of mesenteric branch vessels. Flow enhancement is seen distal to this. Renals: Single right and single left renal arteries. Moderate severe focal stenosis of the  proximal right renal artery less than a cm distal to the origin. Left renal artery appears patent and without aneurysm or dissection. IMA: Chronic occlusion of the IMA. Diminutive appearing distal branch vessels with suboptimal flow enhancement. Inflow: Moderate severe aortic atherosclerosis. Interim stenting of the left common iliac artery which appears patent. Chronically occluded left internal iliac artery. Moderate diffuse disease of left external iliac artery without occlusion. Moderate severe diffuse disease of the right external iliac artery with distal occlusion. The right external iliac artery shows moderate disease with mild to moderate focal stenosis of the right mid external iliac artery. Proximal Outflow: Moderate disease of the right common femoral artery with slightly greater than 50% stenosis of the right common femoral artery. Right superficial femoral artery occludes slightly distal to its origin. Moderate severe stenosis of the deep femoral artery several cm distal to the origin. 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 The superficial femoral artery occludes just distal to its origin. Veins: No obvious venous abnormality within the limitations of this  arterial phase study. Review of the MIP images confirms the above findings. NON-VASCULAR Lower chest: Lung bases demonstrate no acute consolidation or effusion. Scarring or atelectasis at the right middle lobe. Hepatobiliary: No focal liver abnormality is seen. No gallstones, gallbladder wall thickening, or biliary dilatation. Pancreas: Unremarkable. No pancreatic ductal dilatation or surrounding inflammatory changes. Spleen: Normal in size without focal abnormality. Adrenals/Urinary Tract: Adrenal glands are unremarkable. Kidneys are normal, without renal calculi, focal lesion, or hydronephrosis. Bladder is unremarkable. Stomach/Bowel: The stomach contains fluid and hyperdense material. There is no dilated small bowel. Fluid  within the colon. No definite acute bowel wall thickening or inflammation. Negative for intramural air. Lymphatic: No suspicious lymph nodes. Reproductive: Calcified uterine fibroid.  No adnexal mass Other: N

## 2021-12-28 NOTE — Anesthesia Preprocedure Evaluation (Addendum)
Anesthesia Evaluation  ?Patient identified by MRN, date of birth, ID band ?Patient awake ? ? ? ?Reviewed: ?Allergy & Precautions, NPO status , Patient's Chart, lab work & pertinent test results ? ?History of Anesthesia Complications ?Negative for: history of anesthetic complications ? ?Airway ?Mallampati: I ? ?TM Distance: >3 FB ?Neck ROM: Full ? ? ? Dental ?no notable dental hx. ?(+) Dental Advisory Given ?  ?Pulmonary ?Current Smoker and Patient abstained from smoking.,  ?  ?Pulmonary exam normal ? ? ? ? ? ? ? Cardiovascular ?hypertension, Pt. on medications ?+ Peripheral Vascular Disease  ?Normal cardiovascular exam ? ? ?  ?Neuro/Psych ?negative neurological ROS ?   ? GI/Hepatic ?negative GI ROS, Neg liver ROS,   ?Endo/Other  ?negative endocrine ROS ? Renal/GU ?negative Renal ROS  ? ?  ?Musculoskeletal ?negative musculoskeletal ROS ?(+)  ? Abdominal ?  ?Peds ? Hematology ?negative hematology ROS ?(+)   ?Anesthesia Other Findings ? ? Reproductive/Obstetrics ? ?  ? ? ? ? ? ? ? ? ? ? ? ? ? ?  ?  ? ? ? ? ? ? ? ?Anesthesia Physical ?Anesthesia Plan ? ?ASA: 3 ? ?Anesthesia Plan: General  ? ?Post-op Pain Management: Celebrex PO (pre-op)* and Tylenol PO (pre-op)*  ? ?Induction: Intravenous ? ?PONV Risk Score and Plan: 2 and Ondansetron and Dexamethasone ? ?Airway Management Planned: Oral ETT ? ?Additional Equipment:  ? ?Intra-op Plan:  ? ?Post-operative Plan: Extubation in OR ? ?Informed Consent: I have reviewed the patients History and Physical, chart, labs and discussed the procedure including the risks, benefits and alternatives for the proposed anesthesia with the patient or authorized representative who has indicated his/her understanding and acceptance.  ? ? ? ?Dental advisory given ? ?Plan Discussed with: Anesthesiologist, CRNA and Surgeon ? ?Anesthesia Plan Comments:   ? ? ? ? ? ?Anesthesia Quick Evaluation ? ?

## 2021-12-28 NOTE — Op Note (Signed)
? ? ?Patient name: Danielle Morton MRN: [id number] DOB: 1956-06-26 Sex: female ? ?12/28/2021 ?Pre-operative Diagnosis: Mesenteric ischemia ?Post-operative diagnosis:  Same ?Surgeon:  Victorino Sparrow, MD ?Procedure Performed: ?1.  Ultrasound-guided micropuncture access of the left brachial artery ?2.  Mesenteric angiogram ?3.  Percutaneous mechanical thrombectomy of the superior mesenteric artery using penumbra CAT 7 ?4.  3 x 40 mm balloon angioplasty of the distal superior mesenteric artery ?5.  Brachial artery cutdown, primary repair using 6-0 Prolene suture ?  ? ?Indications:  This is a 66 y.o. 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  Abdominal exam remains benign, lactate normal.  ?I had a long conversation with Danielle Morton regarding mesenteric ischemia, and specifically treatment modalities associated with this diagnosis.  The level of occlusion is atypical as it is distal involving jejunal branches.  The middle colic artery appears occluded.  The IMA is not appreciated.  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  There is distal filling of the mesentery which is likely why she a benign abdominal exam.  ?Danielle Morton failed PO intake yesterday due to the blockage. Her pathology can not be treated with conservative measures. Danielle Morton and I discussed operative intervention being percutaneous suction thrombectomy of the thrombus.  All risks and benefits associated with the case. ?She is booked for the operating room today. After discussing the risks and benefits of mesenteric revascularization, Danielle Morton elected to proceed.  ? 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  Blood clot was appreciated surrounding the  occlusion. ?  ?Procedure:   ?Patient was brought to the OR laid in the supine position.  General anesthesia was induced and the patient was prepped and draped in standard fashion.  The case began with ultrasound-guided micropuncture access of the left brachial artery.  From this access, a 5 French sheath was placed and a wire was brought into the infrarenal aorta.  Patient was heparinized to an ACT greater than 250.  A 64f x 55 cm catheter was then advanced into the distal aorta, followed by angiogram from a pigtail catheter in the lateral position.  This demonstrated filling of the celiac artery with roughly 50% stenosis at its ostia, no flow limiting stenosis at the ostia of the superior mesenteric artery.  A Glidewire advantage and angled glide catheter used to engage the superior mesenteric artery.  Selective shots of the superior mesenteric artery followed demonstrating a 2 cm occlusion located at the jejunal branches.  The lesion appeared chronic, with multiple collaterals, and distal filling appreciated.  Being that this was the only lesion identified, I made the decision to attempt recannulation.  A 0.018 wire and quick cross catheter were used to traverse the lesion.  Follow-up angiography demonstrated the wire was in the true lumen distally.  Being that the patient's pain was acute, I thought there could be an acute component, therefore, a 7 [place] CAT 7 penumbra device was brought onto the field and suction thrombectomy attempted.  This yielded minimal thrombus.  I called my partners to evaluate the lesion as well.  We made the decision to attempt balloon angioplasty in an effort to create inline flow to the distal branches of the superior mesenteric artery.  A 3 x 1 mm balloon was brought onto the field and inflated.  After 2 minutes, follow-up angiography demonstrated an improved result, therefore the balloon was used again.  Angiography after the second inflation demonstrated a patent flow lumen with  brisk flow into the distal superior mesenteric artery branches.  I elected not to stent this area, as I was worried a stent would occlude at this level. ? ?The wire and catheter were removed followed by brachial artery cutdown around the 7 French sheath.  The brachial artery was controlled with use of Vesseloops both proximally distally and the 7 French sheath was removed.  The arteriotomy was closed using 6-0 Prolene suture ensuring that the lumen of the artery was visible both proximally and distally.  At case completion, Danielle Morton had a palpable pulse at the wrist. ? ?Impression: Successful mechanical thrombectomy, balloon angioplasty of the distal superior mesenteric artery with inline flow reestablished to distal branches.  Plan will be for systemic anticoagulation lifelong.  Should abdominal pain recur, I will recommend GI work-up including EGD to rule out other disease processes. ? ? ? ?J. Gillis Santa, MD ?[facility] ?Office: [phone] ? ? ? ?

## 2021-12-28 NOTE — Anesthesia Postprocedure Evaluation (Signed)
Anesthesia Post Note ? ?Patient: Danielle Morton ? ?Procedure(s) Performed: MESENTERIC ANGIOGRAPHY FROM LEFT BRACHIAL ARTERY (Left: Arm Upper) ?PERCUTANEOUS THROMBECTOMY (Left: Abdomen) ?BALLOON ANGIOPLASTY OF SUPERIOR MESENTERIC ARTERY (Abdomen) ? ?  ? ?Patient location during evaluation: PACU ?Anesthesia Type: General ?Level of consciousness: awake and alert, patient cooperative and oriented ?Pain management: pain level controlled ?Vital Signs Assessment: post-procedure vital signs reviewed and stable ?Respiratory status: spontaneous breathing, nonlabored ventilation, respiratory function stable and patient connected to nasal cannula oxygen ?Cardiovascular status: blood pressure returned to baseline and stable ?Postop Assessment: no apparent nausea or vomiting ?Anesthetic complications: no ? ? ?No notable events documented. ? ?Last Vitals:  ?Vitals:  ? 12/28/21 1600 12/28/21 1630  ?BP: (!) 188/101 (!) 148/87  ?Pulse: (!) 47 (!) 51  ?Resp: 19 17  ?Temp:    ?SpO2: 99% 96%  ?  ?Last Pain:  ?Vitals:  ? 12/28/21 1500  ?TempSrc: Oral  ?PainSc:   ? ? ?  ?  ?  ?  ?  ?  ? ?Tyshae Stair,E. Toure Edmonds ? ? ? ? ?

## 2021-12-28 NOTE — Transfer of Care (Signed)
Immediate Anesthesia Transfer of Care Note ? ?Patient: Danielle Morton ? ?Procedure(s) Performed: MESENTERIC ANGIOGRAPHY FROM LEFT BRACHIAL ARTERY (Left: Arm Upper) ?PERCUTANEOUS THROMBECTOMY (Left: Abdomen) ?BALLOON ANGIOPLASTY OF SUPERIOR MESENTERIC ARTERY (Abdomen) ? ?Patient Location: PACU ? ?Anesthesia Type:General ? ?Level of Consciousness: awake, alert  and oriented ? ?Airway & Oxygen Therapy: Patient Spontanous Breathing and Patient connected to nasal cannula oxygen ? ?Post-op Assessment: Report given to RN and Post -op Vital signs reviewed and stable ? ?Post vital signs: Reviewed and stable ? ?Last Vitals:  ?Vitals Value Taken Time  ?BP 159/91 12/28/21 1359  ?Temp 37 ?C 12/28/21 1355  ?Pulse 81 12/28/21 1400  ?Resp 18 12/28/21 1400  ?SpO2 93 % 12/28/21 1400  ?Vitals shown include unvalidated device data. ? ?Last Pain:  ?Vitals:  ? 12/28/21 1355  ?TempSrc:   ?PainSc: Asleep  ?   ? ?Patients Stated Pain Goal: 0 (12/27/21 1603) ? ?Complications: No notable events documented. ?

## 2021-12-28 NOTE — Anesthesia Procedure Notes (Signed)
Procedure Name: Intubation ?Date/Time: 12/28/2021 10:40 AM ?Performed by: Minerva Ends, CRNA ?Pre-anesthesia Checklist: Patient identified, Emergency Drugs available, Suction available and Patient being monitored ?Patient Re-evaluated:Patient Re-evaluated prior to induction ?Oxygen Delivery Method: Circle system utilized ?Preoxygenation: Pre-oxygenation with 100% oxygen ?Induction Type: IV induction ?Ventilation: Mask ventilation without difficulty ?Laryngoscope Size: Mac and 3 ?Grade View: Grade I ?Tube type: Oral ?Tube size: 7.0 mm ?Number of attempts: 1 ?Airway Equipment and Method: Stylet and Oral airway ?Placement Confirmation: ETT inserted through vocal cords under direct vision, positive ETCO2 and breath sounds checked- equal and bilateral ?Secured at: 21 cm ?Tube secured with: Tape ?Dental Injury: Teeth and Oropharynx as per pre-operative assessment  ? ? ? ? ?

## 2021-12-28 NOTE — Progress Notes (Signed)
ANTICOAGULATION CONSULT NOTE - Follow Up Consult ? ?Pharmacy Consult for Heparin ?Indication: Mesenteric ischemia ? ?No Known Allergies ? ?Patient Measurements: ?Height: 5\' 4"  (162.6 cm) ?Weight: 54.4 kg (120 lb) ?IBW/kg (Calculated) : 54.7 ?Heparin Dosing Weight: TBW ? ?Vital Signs: ?Temp: 98.6 ?F (37 ?C) (04/10 10-08-1999) ?Temp Source: Oral (04/10 10-08-1999) ?BP: 167/90 (04/10 0722) ?Pulse Rate: 66 (04/10 0722) ? ?Labs: ?Recent Labs  ?  12/26/21 ?1519 12/27/21 ?0445 12/27/21 ?1018 12/28/21 ?0350  ?HGB 16.0* 14.3  --  13.6  ?HCT 47.1* 41.6  --  39.7  ?PLT 268 254  --  236  ?HEPARINUNFRC  --  0.53 0.41 0.67  ?CREATININE 0.68 0.38*  --  0.54  ? ? ? ?Estimated Creatinine Clearance: 61 mL/min (by C-G formula based on SCr of 0.54 mg/dL). ? ? ?Medications:  ?Infusions:  ? heparin 700 Units/hr (12/26/21 2149)  ? ? ?Assessment: ?66 y.o. female with known peripheral arterial disease and history of common femoral endarterectomy with iliac stenting in 2019.  She presented to ED on 4/8 with abdominal pain and CTa imaging demonstrating occlusion.  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   ? ?Today, 12/28/2021: ?Heparin level 0.67, remains therapeutic ?CBC: Hgb slow trend down and Plt WNL ?No bleeding or complications reported.   ? ?Goal of Therapy:  ?Heparin level 0.3-0.7 units/ml ?Monitor platelets by anticoagulation protocol: Yes ?  ?Plan:  ?Continue heparin IV infusion at 700 units/hr ?Daily heparin level and CBC ? ?02/27/2022, PharmD, FCCM ?Clinical Pharmacist ?Please see AMION for all Pharmacists' Contact Phone Numbers ?12/28/2021, 7:29 AM  ? ? ? ? ?

## 2021-12-29 ENCOUNTER — Encounter (HOSPITAL_COMMUNITY): Payer: Self-pay | Admitting: Vascular Surgery

## 2021-12-29 LAB — POCT ACTIVATED CLOTTING TIME
Activated Clotting Time: 155 seconds
Activated Clotting Time: 239 seconds
Activated Clotting Time: 263 seconds

## 2021-12-29 LAB — CBC
HCT: 35.9 % — ABNORMAL LOW (ref 36.0–46.0)
Hemoglobin: 12.1 g/dL (ref 12.0–15.0)
MCH: 32.4 pg (ref 26.0–34.0)
MCHC: 33.7 g/dL (ref 30.0–36.0)
MCV: 96 fL (ref 80.0–100.0)
Platelets: 227 10*3/uL (ref 150–400)
RBC: 3.74 MIL/uL — ABNORMAL LOW (ref 3.87–5.11)
RDW: 14.1 % (ref 11.5–15.5)
WBC: 17.4 10*3/uL — ABNORMAL HIGH (ref 4.0–10.5)
nRBC: 0 % (ref 0.0–0.2)

## 2021-12-29 LAB — HEPARIN LEVEL (UNFRACTIONATED)
Heparin Unfractionated: 0.33 IU/mL (ref 0.30–0.70)
Heparin Unfractionated: 0.44 IU/mL (ref 0.30–0.70)
Heparin Unfractionated: >1.1 IU/mL — ABNORMAL HIGH (ref 0.30–0.70)

## 2021-12-29 MED ORDER — HEPARIN (PORCINE) 25000 UT/250ML-% IV SOLN
800.0000 [IU]/h | INTRAVENOUS | Status: DC
  Administered 2021-12-30: 600 [IU]/h via INTRAVENOUS
  Filled 2021-12-29: qty 250

## 2021-12-29 NOTE — Progress Notes (Signed)
ANTICOAGULATION CONSULT NOTE - Follow Up Consult ? ?Pharmacy Consult for Heparin ?Indication: Mesenteric ischemia ? ?No Known Allergies ? ?Patient Measurements: ?Height: 5\' 4"  (162.6 cm) ?Weight: 54 kg (119 lb 0.8 oz) ?IBW/kg (Calculated) : 54.7 ?Heparin Dosing Weight: TBW ? ?Vital Signs: ?Temp: 97.7 ?F (36.5 ?C) (04/11 0800) ?Temp Source: Oral (04/11 0800) ?BP: 147/87 (04/11 0800) ?Pulse Rate: 78 (04/11 0800) ? ?Labs: ?Recent Labs  ?  12/26/21 ?1519 12/27/21 ?0445 12/27/21 ?1018 12/28/21 ?0350 12/29/21 ?0034 12/29/21 ?02/28/22  ?HGB 16.0* 14.3  --  13.6 12.1  --   ?HCT 47.1* 41.6  --  39.7 35.9*  --   ?PLT 268 254  --  236 227  --   ?HEPARINUNFRC  --  0.53   < > 0.67 >1.10* 0.44  ?CREATININE 0.68 0.38*  --  0.54  --   --   ? < > = values in this interval not displayed.  ? ? ? ?Estimated Creatinine Clearance: 60.6 mL/min (by C-G formula based on SCr of 0.54 mg/dL). ? ? ?Medications:  ?Infusions:  ? heparin 600 Units/hr (12/29/21 02/28/22)  ? ? ?Assessment: ?66 y.o. female with known peripheral arterial disease and history of common femoral endarterectomy with iliac stenting in 2019.  She presented to ED on 4/8 with abdominal pain and CTa imaging demonstrating occlusion.  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   ? ?4/11 heparin level in the therapeutic range ? ?Goal of Therapy:  ?Heparin level 0.3-0.7 units/ml ?Monitor platelets by anticoagulation protocol: Yes ?  ?Plan:  ?Continue heparin at 600 units/hr ?Heparin level in 6 hours ? ?6/11, PharmD, FCCM ?Clinical Pharmacist ?Please see AMION for all Pharmacists' Contact Phone Numbers ?12/29/2021, 11:24 AM  ? ? ? ? ? ?

## 2021-12-29 NOTE — Progress Notes (Addendum)
?  Progress Note ? ? ? ?12/29/2021 ?7:35 AM ?1 Day Post-Op ? ?Subjective:  feels much better ? ?Afebrile ? ? ?Vitals:  ? 12/29/21 0500 12/29/21 0513  ?BP: (!) 155/75 (!) 155/75  ?Pulse: 99 76  ?Resp: 15 20  ?Temp:  97.9 ?F (36.6 ?C)  ?SpO2: 91% 96%  ? ? ?Physical Exam: ?Cardiac:  regular ?Lungs:  non labored ?Incisions:  left arm incision is clean and dry ?Extremities:  easily palpable left radial pulse ?Abdomen:  soft, NT; -N/V ? ?CBC ?   ?Component Value Date/Time  ? WBC 17.4 (H) 12/29/2021 0034  ? RBC 3.74 (L) 12/29/2021 0034  ? HGB 12.1 12/29/2021 0034  ? HCT 35.9 (L) 12/29/2021 0034  ? PLT 227 12/29/2021 0034  ? MCV 96.0 12/29/2021 0034  ? MCH 32.4 12/29/2021 0034  ? MCHC 33.7 12/29/2021 0034  ? RDW 14.1 12/29/2021 0034  ? LYMPHSABS 2.2 08/14/2018 0207  ? MONOABS 0.6 08/14/2018 0207  ? EOSABS 0.0 08/14/2018 0207  ? BASOSABS 0.1 08/14/2018 0207  ? ? ?BMET ?   ?Component Value Date/Time  ? NA 135 12/28/2021 0350  ? K 4.1 12/28/2021 0350  ? CL 106 12/28/2021 0350  ? CO2 20 (L) 12/28/2021 0350  ? GLUCOSE 105 (H) 12/28/2021 0350  ? BUN 11 12/28/2021 0350  ? CREATININE 0.54 12/28/2021 0350  ? CALCIUM 9.6 12/28/2021 0350  ? GFRNONAA >60 12/28/2021 0350  ? GFRAA >60 08/16/2018 0302  ? ? ?INR ?No results found for: INR ? ? ?Intake/Output Summary (Last 24 hours) at 12/29/2021 0735 ?Last data filed at 12/29/2021 0300 ?Gross per 24 hour  ?Intake 1481.91 ml  ?Output --  ?Net 1481.91 ml  ? ? ? ?Assessment/Plan:  66 y.o. female is s/p:  ?1.  Ultrasound-guided micropuncture access of the left brachial artery ?2.  Mesenteric angiogram ?3.  Percutaneous mechanical thrombectomy of the superior mesenteric artery using penumbra CAT 7 ?4.  3 x 40 mm balloon angioplasty of the distal superior mesenteric artery ?5.  Brachial artery cutdown, primary repair using 6-0 Prolene suture  ?1 Day Post-Op ? ? ?-pt feeling much better this morning; palpable left radial pulse. ?-abdomen is soft ?-DVT prophylaxis:  heparin gtt ? ? ?Leontine Locket,  PA-C ?[facility] ?[phone] ?12/29/2021 ?7:35 AM ? ?VASCULAR STAFF ADDENDUM: ?I have independently interviewed and examined the patient. ?I agree with the above.  ?Ragan states she feels much better, no abdominal pain. ?Leukocytosis on exam, likely postoperative, but need to trend to ensure no intra-abdominal pathology. ?On physical exam, palpable radial pulse at the left wrist, some fullness to the incision site, arm rewrapped with Ace.  Please leave this in place. ?Recommend trialing a diet today.  Hopefully she tolerates this.   ?Recommend continuing serial labs to ensure she has a decrease in her leukocytosis. ?Should she fail diet trial, recommend GI consult for work-up, as there are multiple etiologies for abdominal pain. ?Please continue heparin drip for the time being, this will be transition to [facility] prior to discharge. ? ?Please call if any questions or concerns arise, ? ?J. Melene Muller, MD ?[facility] ?Office Phone Number: [phone] ?12/29/2021 7:47 AM ? ? ? ?

## 2021-12-29 NOTE — Progress Notes (Signed)
ANTICOAGULATION CONSULT NOTE - Follow Up Consult ? ?Pharmacy Consult for Heparin ?Indication: Mesenteric ischemia ? ?No Known Allergies ? ?Patient Measurements: ?Height: 5\' 4"  (162.6 cm) ?Weight: 54 kg (119 lb 0.8 oz) ?IBW/kg (Calculated) : 54.7 ?Heparin Dosing Weight: TBW ? ?Vital Signs: ?Temp: 97.7 ?F (36.5 ?C) (04/10 2303) ?Temp Source: Axillary (04/10 2303) ?BP: 166/95 (04/11 0000) ?Pulse Rate: 40 (04/11 0000) ? ?Labs: ?Recent Labs  ?  12/26/21 ?1519 12/26/21 ?1519 12/27/21 ?0445 12/27/21 ?1018 12/28/21 ?0350 12/29/21 ?0034  ?HGB 16.0*  --  14.3  --  13.6 12.1  ?HCT 47.1*  --  41.6  --  39.7 35.9*  ?PLT 268  --  254  --  236 227  ?HEPARINUNFRC  --    < > 0.53 0.41 0.67 >1.10*  ?CREATININE 0.68  --  0.38*  --  0.54  --   ? < > = values in this interval not displayed.  ? ? ? ?Estimated Creatinine Clearance: 60.6 mL/min (by C-G formula based on SCr of 0.54 mg/dL). ? ? ?Medications:  ?Infusions:  ? heparin 700 Units/hr (12/28/21 2100)  ? ? ?Assessment: ?66 y.o. female with known peripheral arterial disease and history of common femoral endarterectomy with iliac stenting in 2019.  She presented to ED on 4/8 with abdominal pain and CTa imaging demonstrating occlusion.  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   ? ?4/11 AM update:  ?Heparin level elevated, likely some intra-operative bolus effect ? ?Goal of Therapy:  ?Heparin level 0.3-0.7 units/ml ?Monitor platelets by anticoagulation protocol: Yes ?  ?Plan:  ?Hold heparin x 1 hr ?Re-start heparin at 600 units/hr ?1000 heparin level ? ?6/11, PharmD, BCPS ?Clinical Pharmacist ?Phone: [phone] ? ? ? ? ?

## 2021-12-29 NOTE — Plan of Care (Signed)
Plan of care is reviewed.  ? ?Problem: Cardiovascular: frequent PACs and PVCs on EKG monitor. CCMD notified Pt had short run VT 10 beats. BP elevated could be from pain. Dilaudid given PRN, Hydralazine was given PRN q 4 hrs to keep SBP < 180 per MD order. ?Goal: Ability to achieve and maintain adequate cardiovascular perfusion will improve ?Outcome: Progressing: No acute distress. Pt is asymptomatic. We will continue to monitor. ?  ?Problem: Cardiovascular: post left brachial vascular accessed, on ACE dressing.  ?Goal: Vascular access site(s) Level 0-1 will be maintained ?Outcome: Progressing: Level 0, good radial pulse, capillary refill <3 sec, dressing left arm is dry and clean.   ?  ?Problem: Education: On Heparin gtt,  no active bleeding or hematoma. Frequent PACs and PVCs as mentioned above. We check lab at am. ?Goal: Understanding of CV disease, CV risk reduction, and recovery process will improve ?Outcome: Progressing: No distress. Monitor EKG closely. ?  ?Problem: Activity: post-op, ambulate with one standby-assist. Unable to tolerate oral intake with clear liquid. Had complaint with stomach pain and cramping. Encouraged to advance oral intake as tolerated.  ?Goal: Ability to return to baseline activity level will improve ?Outcome: Progressing: Able to ambulate with standby assist, pain is well tolerated, able to rest well tonight after pain med was given. ? ?Filiberto Pinks, RN ?  ?

## 2021-12-29 NOTE — Progress Notes (Signed)
Mobility Specialist Progress Note ? ? 12/29/21 1800  ?Mobility  ?Activity Ambulated with assistance in room;Ambulated with assistance to bathroom;Ambulated with assistance in hallway  ?Level of Assistance Minimal assist, patient does 75% or more  ?Assistive Device Front wheel walker  ?LUE Weight Bearing WBAT  ?Distance Ambulated (ft) 200 ft  ?Activity Response Tolerated well  ?$Mobility charge 1 Mobility  ? ?Pre Mobility: 85 HR ?During Mobility: 105 HR ?Post Mobility: 89 HR, 156/69 BP ? ?Received in bed requesting to go to BR before ambulating in hall; reported no pain prior. Pt stating they did not use AD at home and wanted to try going to BR w/o one, x4 LOB w/ minA to regain stability. RW was given for usage for the remainder of session. Successful void. Ambulated w/ no faults or complaints and returned back to the room w/ call bell placed in reach. ? ?Danielle Morton ?Mobility Specialist ?Phone Number [phone] ? ?

## 2021-12-29 NOTE — Progress Notes (Signed)
?PROGRESS NOTE ? ? ? ?Danielle Morton  ZJQ:[id number] DOB: [birth year] DOA: 12/26/2021 ?PCP: Lavinia Sharps, NP  ? ?Brief Narrative:  ? 66 y.o. female with medical history significant of hypertension, PAD status post femoral stenting and endarterectomy presented with ongoing abdominal pain.  On presentation, blood pressure was in the 160s to 200s systolic.  Lactic acid and lipase were normal.  CTA of the abdomen and pelvis: advanced atherosclerosis of the aorta, moderate stenosis of the SMA, no vascular congestion or wall thickening of the mesentery.  Status post left common iliac stenting, moderate to severe disease of the internal iliacs noted with occlusions bilaterally.  Also with occlusion of superficial femoral arteries.  Vascular surgery was consulted.  She was started on heparin drip.  She failed conservative measures so went to the OR 4/10 with successful intervention.  Tolerating clears on 4/11 thus far without an increase in pain ? ? ?Assessment & Plan: ?  ?Mesenteric ischemia ?PAD status post femoral stenting and femoral endarterectomy ?-Presented with worsening ongoing abdominal pain.  CT angio on presentation showed findings as above. ?-Continue heparin drip ?-s/p: angiogram with thrombectomy of SMA and angioplasty of distal superior mesenteric artery ? ?Leukocytosis ?-continue to monitor ?-trend labs ?-no fever so re-assuring ? ?Hypokalemia ?-Replaced ?-BMP in AM ? ?Hypertension ?-meds as able to control ? ? ?DVT prophylaxis: Heparin drip ?Code Status: Full ?Disposition Plan: ?Status is: inpatient  ? ? ? ?Consultants: Vascular surgery ? ? ? ? ?Subjective: ?Tolerating a liquid diet ? ?Objective: ?Vitals:  ? 12/29/21 0400 12/29/21 0500 12/29/21 0513 12/29/21 0800  ?BP: (!) 156/86 (!) 155/75 (!) 155/75 (!) 147/87  ?Pulse: 64 99 76 78  ?Resp: 12 15 20 20   ?Temp:   97.9 ?F (36.6 ?C) 97.7 ?F (36.5 ?C)  ?TempSrc:   Oral Oral  ?SpO2: 96% 91% 96% 96%  ?Weight:      ?Height:      ? ? ?Intake/Output Summary (Last 24  hours) at 12/29/2021 1057 ?Last data filed at 12/29/2021 0300 ?Gross per 24 hour  ?Intake 1481.91 ml  ?Output --  ?Net 1481.91 ml  ? ?Filed Weights  ? 12/26/21 2049 12/28/21 0941  ?Weight: 54.4 kg 54 kg  ? ? ?Examination: ? ? ?General: Appearance:    Thin female in no acute distress  ?   ?Lungs:     respirations unlabored  ?Heart:    Normal heart rate.  ?  ?MS:   All extremities are intact.  ?  ?Neurologic:   Awake, alert  ?  ?  ? ?Data Reviewed: I have personally reviewed following labs and imaging studies ? ?CBC: ?Recent Labs  ?Lab 12/26/21 ?1519 12/27/21 ?0445 12/28/21 ?0350 12/29/21 ?0034  ?WBC 11.6* 11.9* 13.6* 17.4*  ?HGB 16.0* 14.3 13.6 12.1  ?HCT 47.1* 41.6 39.7 35.9*  ?MCV 95.9 96.5 95.9 96.0  ?PLT 268 254 236 227  ? ?Basic Metabolic Panel: ?Recent Labs  ?Lab 12/26/21 ?1519 12/27/21 ?0445 12/28/21 ?0350  ?NA 136 141 135  ?K 3.8 3.2* 4.1  ?CL 101 109 106  ?CO2 20* 22 20*  ?GLUCOSE 103* 85 105*  ?BUN 23 18 11   ?CREATININE 0.68 0.38* 0.54  ?CALCIUM 9.8 8.6* 9.6  ?MG  --   --  2.2  ? ?GFR: ?Estimated Creatinine Clearance: 60.6 mL/min (by C-G formula based on SCr of 0.54 mg/dL). ?Liver Function Tests: ?Recent Labs  ?Lab 12/26/21 ?1519 12/27/21 ?0445  ?AST 20 11*  ?ALT 10 7  ?ALKPHOS 83 72  ?BILITOT 1.0 0.5  ?  PROT 8.5* 7.1  ?ALBUMIN 4.5 3.7  ? ?Recent Labs  ?Lab 12/26/21 ?1519  ?LIPASE 28  ? ?No results for input(s): AMMONIA in the last 168 hours. ?Coagulation Profile: ?No results for input(s): INR, PROTIME in the last 168 hours. ?Cardiac Enzymes: ?No results for input(s): CKTOTAL, CKMB, CKMBINDEX, TROPONINI in the last 168 hours. ?BNP (last 3 results) ?No results for input(s): PROBNP in the last 8760 hours. ?HbA1C: ?No results for input(s): HGBA1C in the last 72 hours. ?CBG: ?No results for input(s): GLUCAP in the last 168 hours. ?Lipid Profile: ?No results for input(s): CHOL, HDL, LDLCALC, TRIG, CHOLHDL, LDLDIRECT in the last 72 hours. ?Thyroid Function Tests: ?No results for input(s): TSH, T4TOTAL, FREET4,  T3FREE, THYROIDAB in the last 72 hours. ?Anemia Panel: ?No results for input(s): VITAMINB12, FOLATE, FERRITIN, TIBC, IRON, RETICCTPCT in the last 72 hours. ?Sepsis Labs: ?Recent Labs  ?Lab 12/26/21 ?1940 12/27/21 ?1951  ?LATICACIDVEN 0.8 0.8  ? ? ?Recent Results (from the past 240 hour(s))  ?Surgical PCR screen     Status: None  ? Collection Time: 12/28/21  8:19 AM  ? Specimen: Nasal Mucosa; Nasal Swab  ?Result Value Ref Range Status  ? MRSA, PCR NEGATIVE NEGATIVE Final  ? Staphylococcus aureus NEGATIVE NEGATIVE Final  ?  Comment: (NOTE) ?The Xpert SA Assay (FDA approved for NASAL specimens in patients 63 ?years of age and older), is one component of a comprehensive ?surveillance program. It is not intended to diagnose infection nor to ?guide or monitor treatment. ?Performed at [facility], [address], [place], [place] ?[address] ?  ?  ? ? ? ? ? ?Radiology Studies: ?HYBRID OR IMAGING ([facility] ONLY) ? ?Result Date: 12/28/2021 ?There is no interpretation for this exam.  This order is for images obtained during a surgical procedure.  Please See "Surgeries" Tab for more information regarding the procedure.   ? ? ? ? ? ?Scheduled Meds: ? aspirin EC  81 mg Oral Daily  ? sodium chloride flush  3 mL Intravenous Q12H  ? ?Continuous Infusions: ? heparin 600 Units/hr (12/29/21 5462)  ? ? ? ? ? ? ? ? ?Joseph Art, DO ?[facility] ?12/29/2021, 10:57 AM  ? ?

## 2021-12-29 NOTE — Progress Notes (Signed)
ANTICOAGULATION CONSULT NOTE - Follow Up Consult ? ?Pharmacy Consult for Heparin ?Indication: Mesenteric ischemia ? ?No Known Allergies ? ?Patient Measurements: ?Height: 5\' 4"  (162.6 cm) ?Weight: 54 kg (119 lb 0.8 oz) ?IBW/kg (Calculated) : 54.7 ?Heparin Dosing Weight: TBW ? ?Vital Signs: ?Temp: 98.9 ?F (37.2 ?C) (04/11 1736) ?Temp Source: Oral (04/11 1736) ?BP: 167/78 (04/11 1736) ?Pulse Rate: 86 (04/11 1736) ? ?Labs: ?Recent Labs  ?  12/27/21 ?0445 12/27/21 ?1018 12/28/21 ?0350 12/29/21 ?0034 12/29/21 ?02/28/22 12/29/21 ?1825  ?HGB 14.3  --  13.6 12.1  --   --   ?HCT 41.6  --  39.7 35.9*  --   --   ?PLT 254  --  236 227  --   --   ?HEPARINUNFRC 0.53   < > 0.67 >1.10* 0.44 0.33  ?CREATININE 0.38*  --  0.54  --   --   --   ? < > = values in this interval not displayed.  ? ? ? ?Estimated Creatinine Clearance: 60.6 mL/min (by C-G formula based on SCr of 0.54 mg/dL). ? ? ?Medications:  ?Infusions:  ? heparin 600 Units/hr (12/29/21 02/28/22)  ? ? ?Assessment: ?66 y.o. female with known peripheral arterial disease and history of common femoral endarterectomy with iliac stenting in 2019.  She presented to ED on 4/8 with abdominal pain and CTa imaging demonstrating occlusion.  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   ? ?Heparin level of 0.33 is therapeutic on heparin 600 units/hr.  ? ?Goal of Therapy:  ?Heparin level 0.3-0.7 units/ml ?Monitor platelets by anticoagulation protocol: Yes ?  ?Plan:  ?Continue heparin at 600 units/hr ?Follow up heparin level with AM labs ? ?6/8, PharmD, BCPS ?Clinical Pharmacist ?12/29/2021 7:15 PM ? ? ? ? ? ?

## 2021-12-30 LAB — HEPARIN LEVEL (UNFRACTIONATED)
Heparin Unfractionated: 0.23 IU/mL — ABNORMAL LOW (ref 0.30–0.70)
Heparin Unfractionated: 0.25 IU/mL — ABNORMAL LOW (ref 0.30–0.70)
Heparin Unfractionated: 0.39 IU/mL (ref 0.30–0.70)

## 2021-12-30 LAB — CBC
HCT: 33.8 % — ABNORMAL LOW (ref 36.0–46.0)
Hemoglobin: 11.2 g/dL — ABNORMAL LOW (ref 12.0–15.0)
MCH: 31.8 pg (ref 26.0–34.0)
MCHC: 33.1 g/dL (ref 30.0–36.0)
MCV: 96 fL (ref 80.0–100.0)
Platelets: 215 10*3/uL (ref 150–400)
RBC: 3.52 MIL/uL — ABNORMAL LOW (ref 3.87–5.11)
RDW: 14 % (ref 11.5–15.5)
WBC: 10.9 10*3/uL — ABNORMAL HIGH (ref 4.0–10.5)
nRBC: 0 % (ref 0.0–0.2)

## 2021-12-30 LAB — BASIC METABOLIC PANEL
Anion gap: 8 (ref 5–15)
BUN: 7 mg/dL — ABNORMAL LOW (ref 8–23)
CO2: 23 mmol/L (ref 22–32)
Calcium: 9.2 mg/dL (ref 8.9–10.3)
Chloride: 104 mmol/L (ref 98–111)
Creatinine, Ser: 0.44 mg/dL (ref 0.44–1.00)
GFR, Estimated: >60 mL/min (ref >60)
Glucose, Bld: 108 mg/dL — ABNORMAL HIGH (ref 70–99)
Potassium: 3.4 mmol/L — ABNORMAL LOW (ref 3.5–5.1)
Sodium: 135 mmol/L (ref 135–145)

## 2021-12-30 MED ORDER — POTASSIUM CHLORIDE CRYS ER 20 MEQ PO TBCR
40.0000 meq | EXTENDED_RELEASE_TABLET | Freq: Once | ORAL | Status: AC
Start: 2021-12-30 — End: 2021-12-30
  Administered 2021-12-30: 40 meq via ORAL
  Filled 2021-12-30: qty 2

## 2021-12-30 MED ORDER — CLOPIDOGREL BISULFATE 75 MG PO TABS
75.0000 mg | ORAL_TABLET | Freq: Every day | ORAL | Status: DC
  Administered 2021-12-30 – 2021-12-31 (×2): 75 mg via ORAL
  Filled 2021-12-30 (×2): qty 1

## 2021-12-30 NOTE — Progress Notes (Signed)
Mobility Specialist Progress Note ? ? 12/30/21 1836  ?Mobility  ?Activity Ambulated with assistance in hallway  ?Level of Assistance Standby assist, set-up cues, supervision of patient - no hands on  ?Assistive Device Front wheel walker  ?LUE Weight Bearing WBAT  ?Distance Ambulated (ft) 470 ft  ?Activity Response Tolerated well  ?$Mobility charge 1 Mobility  ? ?Pre Mobility: 73 HR ?During Mobility:82  HR 97% SpO2 ?Post Mobility: 79 HR 99% SpO2 ? ?Received pt in bed having no complaints and agreeable to mobility. Asymptomatic throughout ambulation, returned back to bed w/ call bell in reach and all needs met. ? ?Holland Falling ?Mobility Specialist ?Phone Number [phone] ? ?

## 2021-12-30 NOTE — Progress Notes (Addendum)
?  Progress Note ? ? ? ?12/30/2021 ?7:21 AM ?2 Days Post-Op ? ?Subjective:  says she is eating without nausea and tolerating.  ? ?Afebrile ?HR 60's ?130's-160's systolic ?96% RA ? ?Vitals:  ? 12/30/21 0000 12/30/21 0456  ?BP: (!) 151/91 (!) 167/83  ?Pulse: 71 69  ?Resp: 13 20  ?Temp:  98.9 ?F (37.2 ?C)  ?SpO2: 95% 95%  ? ? ?Physical Exam: ?General :  no distress ?Lungs:  non labored ?Incisions:  left arm incision is clean with moderate hematoma.  ?Extremities:  left hand is warm and well perfused.  ?Abdomen:  soft, NT ? ?CBC ?   ?Component Value Date/Time  ? WBC 10.9 (H) 12/30/2021 8850  ? RBC 3.52 (L) 12/30/2021 2774  ? HGB 11.2 (L) 12/30/2021 1287  ? HCT 33.8 (L) 12/30/2021 8676  ? PLT 215 12/30/2021 0212  ? MCV 96.0 12/30/2021 0212  ? MCH 31.8 12/30/2021 0212  ? MCHC 33.1 12/30/2021 0212  ? RDW 14.0 12/30/2021 0212  ? LYMPHSABS 2.2 08/14/2018 0207  ? MONOABS 0.6 08/14/2018 0207  ? EOSABS 0.0 08/14/2018 0207  ? BASOSABS 0.1 08/14/2018 0207  ? ? ?BMET ?   ?Component Value Date/Time  ? NA 135 12/30/2021 0212  ? K 3.4 (L) 12/30/2021 7209  ? CL 104 12/30/2021 0212  ? CO2 23 12/30/2021 0212  ? GLUCOSE 108 (H) 12/30/2021 4709  ? BUN 7 (L) 12/30/2021 6283  ? CREATININE 0.44 12/30/2021 0212  ? CALCIUM 9.2 12/30/2021 0212  ? GFRNONAA >60 12/30/2021 0212  ? GFRAA >60 08/16/2018 0302  ? ? ?INR ?No results found for: INR ? ? ?Intake/Output Summary (Last 24 hours) at 12/30/2021 0721 ?Last data filed at 12/30/2021 0500 ?Gross per 24 hour  ?Intake 886 ml  ?Output 200 ml  ?Net 686 ml  ? ? ? ?Assessment/Plan:  66 y.o. female is s/p:  ?1.  Ultrasound-guided micropuncture access of the left brachial artery ?2.  Mesenteric angiogram ?3.  Percutaneous mechanical thrombectomy of the superior mesenteric artery using penumbra CAT 7 ?4.  3 x 40 mm balloon angioplasty of the distal superior mesenteric artery ?5.  Brachial artery cutdown, primary repair using 6-0 Prolene suture   ?2 Days Post-Op ? ? ?-pt abdomen much better and able to tolerate  food.   ?-she does have a moderate hematoma at the incision site.  Will need to monitor this.  Ace in place.  Incision looks good.  ?-DVT prophylaxis:  heparin gtt.  Will need Plavix but given hematoma, may need to hold on this.  Will d/w Dr. Karin Lieu.  ?-leukocytosis improving-down to 10.9k from 17.4k ?-acute blood loss anemia-hgb down this am to 11.2 from 12.1.  need to continue to monitor and keep an eye on the incisional hematoma.  Check cbc tomorrow ? ? ?Doreatha Massed, PA-C ?[facility] ?[phone] ?12/30/2021 ?7:21 AM ? ?VASCULAR STAFF ADDENDUM: ?I have independently interviewed and examined the patient. ?I agree with the above.  ?Small hematoma at cutdown site.  ?No pain with PO intake ?Transition to DOAC tomorrow morning and continue ASA.  ?Home tomorrow pending continued improvement.  ? ?J. Gillis Santa, MD ?[facility] ?Office Phone Number: [phone] ?12/30/2021 6:17 PM ? ? ? ?

## 2021-12-30 NOTE — Progress Notes (Signed)
ANTICOAGULATION CONSULT NOTE ? ?Pharmacy Consult for Heparin ?Indication:  Mesenteric ischemia ? ?No Known Allergies ? ?Patient Measurements: ?Height: 5\' 4"  (162.6 cm) ?Weight: 54 kg (119 lb 0.8 oz) ?IBW/kg (Calculated) : 54.7 ? ?Heparin Dosing Weight: TBW ? ?Vital Signs: ?Temp: 98.5 ?F (36.9 ?C) (04/12 0800) ?Temp Source: Oral (04/12 0800) ?BP: 153/97 (04/12 0800) ?Pulse Rate: 81 (04/12 0800) ? ?Labs: ?Recent Labs  ?  12/28/21 ?0350 12/29/21 ?0034 12/29/21 ?02/28/22 12/29/21 ?1825 12/30/21 ?03/01/22  ?HGB 13.6 12.1  --   --  11.2*  ?HCT 39.7 35.9*  --   --  33.8*  ?PLT 236 227  --   --  215  ?HEPARINUNFRC 0.67 >1.10* 0.44 0.33 0.23*  ?CREATININE 0.54  --   --   --  0.44  ? ? ?Estimated Creatinine Clearance: 60.6 mL/min (by C-G formula based on SCr of 0.44 mg/dL). ? ? ?Assessment: ?66 y.o. female with medical history significant for PAD s/p femoral endarterectomy w/ iliac stenting in 2019 who presented to ED on 4/08 with abdominal pain and CTa imaging demonstrating occlusion. 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   ? ?Hgb slow trending down, moderate hematoma at incisional site reported. No other s/sx bleeding. Platelets 215. Heparin level subtherapeutic at 0.23 on heparin 600 units/hr.  ? ? ?Goal of Therapy:  ?Heparin level 0.3-0.7 units/ml ?Monitor platelets by anticoagulation protocol: Yes ?  ?Plan ?Increase heparin infusion to 700 units/hr ?Check heparin level in 6 hours and daily while on heparin ?Continue to monitor H&H and platelets ? ? ? ?Thank you for allowing pharmacy to be a part of this patient?s care. ? ?6/08, PharmD ?Clinical Pharmacist ? ? ? ?

## 2021-12-30 NOTE — Progress Notes (Signed)
Pt is alert and fully oriented x 4. Her husband stays at bedside tonight. Pt is doing well, denies pain, able to rest well tonight. She is hemodynamically stable, BP is well controlled 136/85 - 151/91 mmHg, HR 70s-80s, NSR with frequent PAC. On room air, afebrile.  ? ?Left arm on ACE wrapped, dressing is dry and clean, no drainage. Good radial pulse and capillary refilled on left hand. On heparin gtt at 6 ml/hr.  ? ?Pt has no acute distress noted. We will continue to monitor. ? ?Filiberto Pinks, RN  ?

## 2021-12-30 NOTE — Progress Notes (Signed)
ANTICOAGULATION CONSULT NOTE ? ?Pharmacy Consult for Heparin ?Indication:  Mesenteric ischemia ? ?No Known Allergies ? ?Patient Measurements: ?Height: 5\' 4"  (162.6 cm) ?Weight: 54 kg (119 lb 0.8 oz) ?IBW/kg (Calculated) : 54.7 ? ?Heparin Dosing Weight: 54 kg ? ?Vital Signs: ?Temp: 98.4 ?F (36.9 ?C) (04/12 1559) ?Temp Source: Oral (04/12 1559) ?BP: 163/89 (04/12 1559) ?Pulse Rate: 86 (04/12 1559) ? ?Labs: ?Recent Labs  ?  12/28/21 ?0350 12/29/21 ?0034 12/29/21 ?02/28/22 12/29/21 ?1825 12/30/21 ?03/01/22 12/30/21 ?1628  ?HGB 13.6 12.1  --   --  11.2*  --   ?HCT 39.7 35.9*  --   --  33.8*  --   ?PLT 236 227  --   --  215  --   ?HEPARINUNFRC 0.67 >1.10*   < > 0.33 0.23* 0.25*  ?CREATININE 0.54  --   --   --  0.44  --   ? < > = values in this interval not displayed.  ? ? ? ?Estimated Creatinine Clearance: 60.6 mL/min (by C-G formula based on SCr of 0.44 mg/dL). ? ? ?Assessment: ?66 y.o. female with medical history significant for PAD s/p femoral endarterectomy w/ iliac stenting in 2019 who presented to ED on 4/08 with abdominal pain and CTa imaging demonstrating occlusion. 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   ? ?Heparin level subtherapeutic at 0.25 units/hr.  ?IV site bleeding this AM and RN switched sites and this has improved. Hgb slow trending down this, moderate hematoma at incisional site however appears stable per RN. No other s/sx bleeding. Platelets 215 this AM.  ? ?Goal of Therapy:  ?Heparin level 0.3-0.7 units/ml ?Monitor platelets by anticoagulation protocol: Yes ?  ?Plan ?Increase heparin infusion to 800 units/hr ?Check heparin level in 6 hours and daily while on heparin ?Continue to monitor H&H and platelets ? ? ?Thank you for allowing pharmacy to be a part of this patient?s care. ? ?6/08, PharmD ?PGY2 Cardiology Pharmacy Resident ?Phone: [phone] ?12/30/2021  5:08 PM ? ?Please check [email or web address] for unit-specific pharmacy phone numbers. ? ? ? ? ?

## 2021-12-30 NOTE — Progress Notes (Signed)
ANTICOAGULATION CONSULT NOTE - Follow Up Consult ? ?Pharmacy Consult for heparin ?Indication:  mesenteric ischemia ? ?Labs: ?Recent Labs  ?  12/28/21 ?0350 12/29/21 ?0034 12/29/21 ?6440 12/30/21 ?3474 12/30/21 ?1628 12/30/21 ?2245  ?HGB 13.6 12.1  --  11.2*  --   --   ?HCT 39.7 35.9*  --  33.8*  --   --   ?PLT 236 227  --  215  --   --   ?HEPARINUNFRC 0.67 >1.10*   < > 0.23* 0.25* 0.39  ?CREATININE 0.54  --   --  0.44  --   --   ? < > = values in this interval not displayed.  ? ? ?Assessment/Plan:  ?66yo female therapeutic on heparin after rate change. Will continue infusion at current rate of 800 units/hr and confirm stable with am labsl.  ? ?Vernard Gambles, PharmD, BCPS  ?12/30/2021,11:32 PM ? ? ?

## 2021-12-30 NOTE — Progress Notes (Signed)
? Danielle Morton  DEY:[id number] DOB: 08-Apr-1956 DOA: 12/26/2021 ?PCP: Lavinia Sharps, NP   ? ?Brief Narrative:  ?66 year old with a history of HTN and PAD status post femoral stenting and endarterectomy who presented to the hospital with severe generalized abdominal pain.  At presentation she was found to have systolic blood pressures of 160-200.  Lipase was normal.  CT angio of the abdomen and pelvis noted advanced atherosclerosis of aorta, moderate stenosis of the SMA, and no wall thickening of the mesentery.  Moderate to severe disease of the internal iliacs was noted with occlusions bilaterally also involving superficial femoral arteries. ? ?Ultimately the patient was taken to the OR by vascular surgery 4/10 where she underwent mesenteric angiogram with percutaneous mechanical thrombectomy of the superior mesenteric artery and balloon angioplasty of the distal superior mesenteric artery. ? ?Consultants:  ?Vascular Surgery ? ?Code Status: FULL CODE ? ?DVT prophylaxis: ?IV heparin ? ?Interim Hx: ?Afebrile.  Vital signs stable.  WBC trending downward.  Resting comfortably in bed.  Reports good appetite and intake today.  Reports no significant pain at time of visit.  Is anxious to go home ASAP. ? ?Assessment & Plan: ? ?Mesenteric ischemia -generalized abdominal pain ?Status post angiogram with thrombectomy of SMA and angioplasty of distal superior mesenteric artery which she tolerated well -postoperative care per vascular surgery -wound from surgery with hematoma which vascular surgery is following closely ? ?Leukocytosis ?Improving -likely simply due to stress of surgery ? ?Hypokalemia ?Due to poor intake -supplement and follow -check magnesium ? ?HTN ?Reasonably well-controlled at present -monitor without change in treatment today ? ?PAD status post prior femoral artery stenting and femoral endarterectomy ? ? ?Family Communication: Spoke with patient and family at bedside ?Disposition: From home -medically stable  for discharge at this time -awaiting clearance from vascular surgery for stability from their standpoint with plan to discharge home when safe ? ?Objective: ?Blood pressure (!) 153/97, pulse 81, temperature 98.5 ?F (36.9 ?C), temperature source Oral, resp. rate 17, height 5\' 4"  (1.626 m), weight 54 kg, SpO2 99 %. ? ?Intake/Output Summary (Last 24 hours) at 12/30/2021 1029 ?Last data filed at 12/30/2021 0957 ?Gross per 24 hour  ?Intake 675.69 ml  ?Output 200 ml  ?Net 475.69 ml  ? ?Filed Weights  ? 12/26/21 2049 12/28/21 0941  ?Weight: 54.4 kg 54 kg  ? ? ?Examination: ?General: No acute respiratory distress ?Lungs: Clear to auscultation bilaterally without wheezes or crackles ?Cardiovascular: Regular rate and rhythm without murmur gallop or rub normal S1 and S2 ?Abdomen: Abdomen dressed, bowel sounds positive, no rebound ?Extremities: No significant cyanosis, clubbing, or edema bilateral lower extremities ? ?CBC: ?Recent Labs  ?Lab 12/28/21 ?0350 12/29/21 ?02/28/22 12/30/21 ?03/01/22  ?WBC 13.6* 17.4* 10.9*  ?HGB 13.6 12.1 11.2*  ?HCT 39.7 35.9* 33.8*  ?MCV 95.9 96.0 96.0  ?PLT 236 227 215  ? ?Basic Metabolic Panel: ?Recent Labs  ?Lab 12/27/21 ?0445 12/28/21 ?0350 12/30/21 ?03/01/22  ?NA 141 135 135  ?K 3.2* 4.1 3.4*  ?CL 109 106 104  ?CO2 22 20* 23  ?GLUCOSE 85 105* 108*  ?BUN 18 11 7*  ?CREATININE 0.38* 0.54 0.44  ?CALCIUM 8.6* 9.6 9.2  ?MG  --  2.2  --   ? ?GFR: ?Estimated Creatinine Clearance: 60.6 mL/min (by C-G formula based on SCr of 0.44 mg/dL). ? ?Liver Function Tests: ?Recent Labs  ?Lab 12/26/21 ?1519 12/27/21 ?0445  ?AST 20 11*  ?ALT 10 7  ?ALKPHOS 83 72  ?BILITOT 1.0 0.5  ?PROT 8.5* 7.1  ?  ALBUMIN 4.5 3.7  ? ?Recent Labs  ?Lab 12/26/21 ?1519  ?LIPASE 28  ? ? ?Scheduled Meds: ? aspirin EC  81 mg Oral Daily  ? sodium chloride flush  3 mL Intravenous Q12H  ? ?Continuous Infusions: ? heparin 700 Units/hr (12/30/21 1015)  ? ? ? LOS: 3 days  ? ?Lonia Blood, MD ?[facility] ?Office  [phone] ?Pager - Text Page  per Loretha Stapler ? ?If 7PM-7AM, please contact night-coverage per Amion ?12/30/2021, 10:29 AM ? ? ? ? ?

## 2021-12-31 ENCOUNTER — Other Ambulatory Visit (HOSPITAL_COMMUNITY): Payer: Self-pay

## 2021-12-31 LAB — COMPREHENSIVE METABOLIC PANEL
ALT: 8 U/L (ref 0–44)
AST: 12 U/L — ABNORMAL LOW (ref 15–41)
Albumin: 3.3 g/dL — ABNORMAL LOW (ref 3.5–5.0)
Alkaline Phosphatase: 57 U/L (ref 38–126)
Anion gap: 7 (ref 5–15)
BUN: <5 mg/dL — ABNORMAL LOW (ref 8–23)
CO2: 23 mmol/L (ref 22–32)
Calcium: 9.3 mg/dL (ref 8.9–10.3)
Chloride: 106 mmol/L (ref 98–111)
Creatinine, Ser: 0.43 mg/dL — ABNORMAL LOW (ref 0.44–1.00)
GFR, Estimated: >60 mL/min (ref >60)
Glucose, Bld: 105 mg/dL — ABNORMAL HIGH (ref 70–99)
Potassium: 3.7 mmol/L (ref 3.5–5.1)
Sodium: 136 mmol/L (ref 135–145)
Total Bilirubin: 0.5 mg/dL (ref 0.3–1.2)
Total Protein: 6.5 g/dL (ref 6.5–8.1)

## 2021-12-31 LAB — CBC
HCT: 34.8 % — ABNORMAL LOW (ref 36.0–46.0)
Hemoglobin: 12.2 g/dL (ref 12.0–15.0)
MCH: 32.7 pg (ref 26.0–34.0)
MCHC: 35.1 g/dL (ref 30.0–36.0)
MCV: 93.3 fL (ref 80.0–100.0)
Platelets: 215 10*3/uL (ref 150–400)
RBC: 3.73 MIL/uL — ABNORMAL LOW (ref 3.87–5.11)
RDW: 13.8 % (ref 11.5–15.5)
WBC: 6.8 10*3/uL (ref 4.0–10.5)
nRBC: 0 % (ref 0.0–0.2)

## 2021-12-31 LAB — HEPARIN LEVEL (UNFRACTIONATED): Heparin Unfractionated: 0.44 IU/mL (ref 0.30–0.70)

## 2021-12-31 LAB — MAGNESIUM: Magnesium: 1.9 mg/dL (ref 1.7–2.4)

## 2021-12-31 MED ORDER — CLOPIDOGREL BISULFATE 75 MG PO TABS: 75.0000 mg | ORAL_TABLET | Freq: Every day | ORAL | 1 refills | Status: DC

## 2021-12-31 MED ORDER — ACETAMINOPHEN 325 MG PO TABS: 650.0000 mg | ORAL_TABLET | Freq: Four times a day (QID) | ORAL | Status: DC | PRN

## 2021-12-31 MED ORDER — RIVAROXABAN 20 MG PO TABS: 20.0000 mg | ORAL_TABLET | Freq: Every day | ORAL | Status: DC

## 2021-12-31 MED ORDER — RIVAROXABAN 20 MG PO TABS: 20.0000 mg | ORAL_TABLET | Freq: Every day | ORAL | 1 refills | Status: DC

## 2021-12-31 MED ORDER — RIVAROXABAN (XARELTO) EDUCATION KIT FOR DVT/PE PATIENTS
PACK | Freq: Once | Status: DC
  Filled 2021-12-31: qty 1

## 2021-12-31 MED ORDER — RIVAROXABAN 15 MG PO TABS
15.0000 mg | ORAL_TABLET | Freq: Two times a day (BID) | ORAL | Status: DC
  Administered 2021-12-31 (×2): 15 mg via ORAL
  Filled 2021-12-31 (×2): qty 1

## 2021-12-31 MED ORDER — ASPIRIN 81 MG PO TBEC: 81.0000 mg | DELAYED_RELEASE_TABLET | Freq: Every day | ORAL | 11 refills | Status: DC

## 2021-12-31 MED ORDER — RIVAROXABAN (XARELTO) VTE STARTER PACK (15 & 20 MG)
ORAL_TABLET | ORAL | 0 refills | Status: DC
  Filled 2021-12-31: qty 51, 30d supply, fill #0

## 2021-12-31 NOTE — Progress Notes (Addendum)
?  Progress Note ? ? ? ?12/31/2021 ?7:00 AM ?3 Days Post-Op ? ?Subjective:  no complaints; still tolerating diet.  Wants to know why there is a sore on her breast (right) ? ?afebrile ? ?Vitals:  ? 12/30/21 2343 12/31/21 0425  ?BP: (!) 157/78 108/82  ?Pulse: 76 75  ?Resp: 14 15  ?Temp: 98.1 ?F (36.7 ?C) 98.3 ?F (36.8 ?C)  ?SpO2: 96% 99%  ? ? ?Physical Exam: ?General:  no distress ?Lungs:  non labored ?Incisions:  hematoma left arm slightly smaller; incision looks fine.  Left hand warm and well perfused.  ? ? ?CBC ?   ?Component Value Date/Time  ? WBC 6.8 12/31/2021 0228  ? RBC 3.73 (L) 12/31/2021 0228  ? HGB 12.2 12/31/2021 0228  ? HCT 34.8 (L) 12/31/2021 0228  ? PLT 215 12/31/2021 0228  ? MCV 93.3 12/31/2021 0228  ? MCH 32.7 12/31/2021 0228  ? MCHC 35.1 12/31/2021 0228  ? RDW 13.8 12/31/2021 0228  ? LYMPHSABS 2.2 08/14/2018 0207  ? MONOABS 0.6 08/14/2018 0207  ? EOSABS 0.0 08/14/2018 0207  ? BASOSABS 0.1 08/14/2018 0207  ? ? ?BMET ?   ?Component Value Date/Time  ? NA 136 12/31/2021 0228  ? K 3.7 12/31/2021 0228  ? CL 106 12/31/2021 0228  ? CO2 23 12/31/2021 0228  ? GLUCOSE 105 (H) 12/31/2021 0228  ? BUN <5 (L) 12/31/2021 0228  ? CREATININE 0.43 (L) 12/31/2021 0228  ? CALCIUM 9.3 12/31/2021 0228  ? GFRNONAA >60 12/31/2021 0228  ? GFRAA >60 08/16/2018 0302  ? ? ?INR ?No results found for: INR ? ? ?Intake/Output Summary (Last 24 hours) at 12/31/2021 0700 ?Last data filed at 12/30/2021 1758 ?Gross per 24 hour  ?Intake 557.54 ml  ?Output --  ?Net 557.54 ml  ? ? ? ?Assessment/Plan:  66 y.o. female is s/p:  ?1.  Ultrasound-guided micropuncture access of the left brachial artery ?2.  Mesenteric angiogram ?3.  Percutaneous mechanical thrombectomy of the superior mesenteric artery using penumbra CAT 7 ?4.  3 x 40 mm balloon angioplasty of the distal superior mesenteric artery ?5.  Brachial artery cutdown, primary repair using 6-0 Prolene suture    ?3 Days Post-Op ? ? ?-pt left hand is warm and well perfused.  Hematoma appears  slightly smaller today.   ?-acute blood loss anemia-improved from yesterday ?-DVT prophylaxis:  heparin gtt-to transition to DOAC and plavix for discharge. ?-pt has sore on right breast-will defer to medicine team for guidance  ? ? ?Doreatha Massed, PA-C ?[facility] ?[phone] ?12/31/2021 ?7:00 AM ? ?VASCULAR STAFF ADDENDUM: ?I have independently interviewed and examined the patient. ?I agree with the above.  ?Happy with resolution of symptoms. Will follow in the out setting. ?Will continue to monitor the left arm brachial hematoma. Unchanged in size. ? ?J. Gillis Santa, MD ?[facility] ?Office Phone Number: [phone] ?12/31/2021 10:26 AM ? ? ? ?

## 2021-12-31 NOTE — Discharge Summary (Signed)
?DISCHARGE SUMMARY ? ?Danielle Morton ? ?MR#: [id number] ? ?DOB:Aug 25, 1956  ?Date of Admission: 12/26/2021 ?Date of Discharge: 12/31/2021 ? ?Attending Physician:Skippy Marhefka Silvestre Gunner, MD ? ?Patient's FBP:PHKFEX, Danielle Abrahams, NP ? ?Consults: ?Vascular Surgery ? ?Disposition: Discharge home ? ?Follow-up Appts: ? Follow-up Information   ? ? [facility]. Call.   ?Why: Please call on monday to establish aq primary care doctor, they have finacial counselling and a pharmacy as well ?Contact information: ?[address] ?[place] [address] ?[phone] ? ?  ?  ? ? Victorino Sparrow, MD Follow up.   ?Specialty: Vascular Surgery ?Why: The office will arrange for a follow up appointment. ?Contact information: ?[address] ?[place] [address] ?[phone] ? ? ?  ?  ? ?  ?  ? ?  ? ? ?Tests Needing Follow-up: ?-Assess tolerance of newly prescribed Xarelto ?-Assess hematoma at surgical site, which was improving at time of discharge ?-Assess blood pressure and determine if resumption of medical therapy indicated (ACEi/HCTZ held in perioperative period) ? ?Discharge Diagnoses: ?Mesenteric ischemia -generalized abdominal pain ?Leukocytosis ?Hypokalemia ?HTN ?PAD status post prior femoral artery stenting and femoral endarterectomy ? ?Initial presentation: ?66 year old with a history of HTN and PAD status post femoral stenting and endarterectomy who presented to the hospital with severe generalized abdominal pain.  At presentation she was found to have systolic blood pressures of 160-200.  Lipase was normal.  CT angio of the abdomen and pelvis noted advanced atherosclerosis of aorta, moderate stenosis of the SMA, and no wall thickening of the mesentery.  Moderate to severe disease of the internal iliacs was noted with occlusions bilaterally also involving superficial femoral arteries. ?  ?Hospital Course: ?Ultimately the patient was taken to the OR by Vascular Surgery 4/10 where she  underwent mesenteric angiogram with percutaneous mechanical thrombectomy of the superior mesenteric artery and balloon angioplasty of the distal superior mesenteric artery. ? ?Mesenteric ischemia -generalized abdominal pain ?Status post angiogram with thrombectomy of SMA and angioplasty of distal superior mesenteric artery which she tolerated well -postoperative care per vascular surgery -wound from surgery with hematoma which vascular surgery is following closely and feels is improving ?  ?Leukocytosis ?Improving -likely simply due to stress of surgery -now resolved ?  ?R breast lesion ?Reported in a.m. on date of discharge -within a few hours had resolved -appears to have simply been irritation from a cardiac telemetry electrode ?  ?Hypokalemia ?Due to poor intake -corrected with simple supplementation - magnesium normal ?  ?HTN ?Reasonably well-controlled -monitor without change in treatment ?  ?PAD status post prior femoral artery stenting and femoral endarterectomy ?  ? ? ?Allergies as of 12/31/2021   ?No Known Allergies ?  ? ?  ?Medication List  ?  ? ?STOP taking these medications   ? ?lisinopril-hydrochlorothiazide 10-12.5 MG tablet ?Commonly known as: ZESTORETIC ?  ?oxyCODONE 5 MG immediate release tablet ?Commonly known as: Oxy IR/ROXICODONE ?  ?rosuvastatin 10 MG tablet ?Commonly known as: CRESTOR ?  ? ?  ? ?TAKE these medications   ? ?acetaminophen 325 MG tablet ?Commonly known as: TYLENOL ?Take 2 tablets (650 mg total) by mouth every 6 (six) hours as needed for mild pain (or Fever >/= 101). ?  ?aspirin 81 MG EC tablet ?Take 1 tablet (81 mg total) by mouth daily. Swallow whole. ?Start taking on: January 01, 2022 ?What changed: additional instructions ?  ?clopidogrel 75 MG tablet ?Commonly known as: PLAVIX ?Take 1 tablet (75 mg total) by  mouth daily. ?Start taking on: January 01, 2022 ?  ?Xarelto Starter Pack ?Generic drug: Rivaroxaban Stater Pack (15 mg and 20 mg) ?Follow package directions: Take one 15mg   tablet by mouth twice a day. On day 22, switch to one 20mg  tablet once a day. Take with food. ?  ?rivaroxaban 20 MG Tabs tablet ?Commonly known as: XARELTO ?Take 1 tablet (20 mg total) by mouth daily with supper. To begin AFTER YOU FINISH the XARELTO STARTER PACK ?Start taking on: Jan 30, 2022 ?  ? ?  ? ? ?Day of Discharge ?BP (!) 150/94 (BP Location: Left Arm)   Pulse 90   Temp 98.1 ?F (36.7 ?C) (Oral)   Resp 16   Ht 5\' 4"  (1.626 m)   Wt 54 kg   SpO2 99%   BMI 20.43 kg/m?  ? ?Physical Exam: ?General: No acute respiratory distress ?Lungs: Clear to auscultation bilaterally without wheezes or crackles ?Cardiovascular: Regular rate and rhythm without murmur gallop or rub normal S1 and S2 ?Abdomen: Nontender, nondistended, soft, bowel sounds positive, no rebound, no ascites, no appreciable mass ?Extremities: No significant cyanosis, clubbing, or edema bilateral lower extremities ? ?Basic Metabolic Panel: ?Recent Labs  ?Lab 12/26/21 ?1519 12/27/21 ?0445 12/28/21 ?0350 12/30/21 ?02/26/22 12/31/21 ?03/01/22  ?NA 136 141 135 135 136  ?K 3.8 3.2* 4.1 3.4* 3.7  ?CL 101 109 106 104 106  ?CO2 20* 22 20* 23 23  ?GLUCOSE 103* 85 105* 108* 105*  ?BUN 23 18 11  7* <5*  ?CREATININE 0.68 0.38* 0.54 0.44 0.43*  ?CALCIUM 9.8 8.6* 9.6 9.2 9.3  ?MG  --   --  2.2  --  1.9  ? ? ?Liver Function Tests: ?Recent Labs  ?Lab 12/26/21 ?1519 12/27/21 ?0445 12/31/21 ?0228  ?AST 20 11* 12*  ?ALT 10 7 8   ?ALKPHOS 83 72 57  ?BILITOT 1.0 0.5 0.5  ?PROT 8.5* 7.1 6.5  ?ALBUMIN 4.5 3.7 3.3*  ? ? ?CBC: ?Recent Labs  ?Lab 12/27/21 ?0445 12/28/21 ?0350 12/29/21 ?01/02/22 12/30/21 ?02/26/22 12/31/21 ?02/28/22  ?WBC 11.9* 13.6* 17.4* 10.9* 6.8  ?HGB 14.3 13.6 12.1 11.2* 12.2  ?HCT 41.6 39.7 35.9* 33.8* 34.8*  ?MCV 96.5 95.9 96.0 96.0 93.3  ?PLT 254 236 227 215 215  ? ? ?Time spent in discharge (includes decision making & examination of pt): ?35 minutes ? ?12/31/2021, 2:13 PM  ? ?03/01/22, MD ?[facility] ?Office  [phone] ? ? ? ? ? ?

## 2021-12-31 NOTE — TOC Transition Note (Signed)
Transition of Care (TOC) - CM/SW Discharge Note ?Marvetta Gibbons Therapist, sports, BSN ?Transitions of Care ?Unit 4E- RN Case Manager ?See Treatment Team for direct phone #  ? ? ?Patient Details  ?Name: Danielle Morton ?MRN: [id number] ?Date of Birth: Dec 03, 1955 ? ?Transition of Care (TOC) CM/SW Contact:  ?Dahlia Client, Romeo Rabon, RN ?Phone Number: ?12/31/2021, 2:52 PM ? ? ?Clinical Narrative:    ?Pt stable for transition home today, Pt new on Xarelto, Long to fill first 30 day with 30 day free coupon. Cm spoke with pt and provided patient application for patient assistance program. Pt will need to f/u with provider to submit application. Pt agreeable to [facility] f/u. Reports family is trying to assist her in getting insurance/etc.  ?CM called and made hospital f/u appointment for tomorrow 4/14 at 9:50 pt can take her Xarelto pt assistance application with her for clinic to assist her with it and start process.  ? ? ? ?Final next level of care: Home/Self Care ?Barriers to Discharge: No Barriers Identified ? ? ?Patient Goals and CMS Choice ?Patient states their goals for this hospitalization and ongoing recovery are:: return home ?  ?Choice offered to / list presented to : NA ? ?Discharge Placement ?  ?           ?  ? home ?  ?  ? ?Discharge Plan and Services ?  ?Discharge Planning Services: CM Consult, [facility], Follow-up appt scheduled, Medication Assistance ?Post Acute Care Choice: NA          ?DME Arranged: N/A ?DME Agency: NA ?  ?  ?  ?HH Arranged: NA ?[place] Agency: NA ?  ?  ?  ? ?Social Determinants of Health (SDOH) Interventions ?  ? ? ?Readmission Risk Interventions ? ?  12/31/2021  ?  2:52 PM  ?Readmission Risk Prevention Plan  ?Post Dischage Appt Complete  ?Medication Screening Complete  ?Transportation Screening Complete  ? ? ? ? ? ?

## 2021-12-31 NOTE — Progress Notes (Signed)
ANTICOAGULATION CONSULT NOTE ? ?Pharmacy Consult for Heparin ?Indication:  Mesenteric ischemia ? ?No Known Allergies ? ?Patient Measurements: ?Height: 5\' 4"  (162.6 cm) ?Weight: 54 kg (119 lb 0.8 oz) ?IBW/kg (Calculated) : 54.7 ? ?Heparin Dosing Weight: 54 kg ? ?Vital Signs: ?Temp: 97.8 ?F (36.6 ?C) (04/13 0755) ?Temp Source: Oral (04/13 0755) ?BP: 141/91 (04/13 0755) ?Pulse Rate: 85 (04/13 0755) ? ?Labs: ?Recent Labs  ?  12/29/21 ?0034 12/29/21 ?02/28/22 12/30/21 ?03/01/22 12/30/21 ?1628 12/30/21 ?2245 12/31/21 ?0228  ?HGB 12.1  --  11.2*  --   --  12.2  ?HCT 35.9*  --  33.8*  --   --  34.8*  ?PLT 227  --  215  --   --  215  ?HEPARINUNFRC >1.10*   < > 0.23* 0.25* 0.39 0.44  ?CREATININE  --   --  0.44  --   --  0.43*  ? < > = values in this interval not displayed.  ? ? ? ?Estimated Creatinine Clearance: 60.6 mL/min (A) (by C-G formula based on SCr of 0.43 mg/dL (L)). ? ? ?Assessment: ?66 y.o. female with medical history significant for PAD s/p femoral endarterectomy w/ iliac stenting in 2019 who presented to ED on 4/08 with abdominal pain and CTa imaging demonstrating occlusion. 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   ? ?Heparin level remains therapeutic at 0.44 on 800 units/hr.  ?Hgb stable, moderate hematoma at incisional site however appears stable and smaller this morning per VVS. No other s/sx bleeding noted. Platelets stable at 215 this AM.  ?Plan is for DOAC and Plavix at discharge. ? ?Goal of Therapy:  ?Heparin level 0.3-0.7 units/ml ?Monitor platelets by anticoagulation protocol: Yes ?  ?Plan ?Continue heparin infusion at 800 units/hr ?Check heparin level daily while on heparin ?Continue to monitor H&H and platelets ?F/u DOAC ? ? ?Thank you for allowing 6/08 to participate in this patients care. ?[place], PharmD ?12/31/2021 10:50 AM ? ?**Pharmacist phone directory can be found on [email or web address] listed under [facility]** ? ? ? ? ? ?

## 2021-12-31 NOTE — Progress Notes (Signed)
ANTICOAGULATION CONSULT NOTE - Initial Consult ? ?Pharmacy Consult for Xarelto (from heparin) ?Indication:  mesenteric ischemia ? ?No Known Allergies ? ?Patient Measurements: ?Height: 5\' 4"  (162.6 cm) ?Weight: 54 kg (119 lb 0.8 oz) ?IBW/kg (Calculated) : 54.7 ?Heparin Dosing Weight: 54 kg ? ?Vital Signs: ?Temp: 97.8 ?F (36.6 ?C) (04/13 0755) ?Temp Source: Oral (04/13 0755) ?BP: 141/91 (04/13 0755) ?Pulse Rate: 85 (04/13 0755) ? ?Labs: ?Recent Labs  ?  12/29/21 ?0034 12/29/21 ?02/28/22 12/30/21 ?03/01/22 12/30/21 ?1628 12/30/21 ?2245 12/31/21 ?0228  ?HGB 12.1  --  11.2*  --   --  12.2  ?HCT 35.9*  --  33.8*  --   --  34.8*  ?PLT 227  --  215  --   --  215  ?HEPARINUNFRC >1.10*   < > 0.23* 0.25* 0.39 0.44  ?CREATININE  --   --  0.44  --   --  0.43*  ? < > = values in this interval not displayed.  ? ? ?Estimated Creatinine Clearance: 60.6 mL/min (A) (by C-G formula based on SCr of 0.43 mg/dL (L)). ? ? ?Medical History: ?Past Medical History:  ?Diagnosis Date  ? Hypertension   ? PVD (peripheral vascular disease) (HCC)   ? ? ?Medications:  ?Medications Prior to Admission  ?Medication Sig Dispense Refill Last Dose  ? aspirin EC 81 MG EC tablet Take 1 tablet (81 mg total) by mouth daily. (Patient not taking: Reported on 12/26/2021) 30 tablet 11 Completed Course  ? clopidogrel (PLAVIX) 75 MG tablet Take 1 tablet (75 mg total) by mouth daily. (Patient not taking: Reported on 12/26/2021) 30 tablet 11 Completed Course  ? lisinopril-hydrochlorothiazide (PRINZIDE,ZESTORETIC) 10-12.5 MG tablet Take 1 tablet by mouth daily. (Patient not taking: Reported on 12/26/2021) 30 tablet 1 Completed Course  ? oxyCODONE (OXY IR/ROXICODONE) 5 MG immediate release tablet Take 1-2 tablets (5-10 mg total) by mouth every 6 (six) hours as needed for moderate pain. (Patient not taking: Reported on 10/06/2018) 20 tablet 0 Completed Course  ? rosuvastatin (CRESTOR) 10 MG tablet Take 1 tablet (10 mg total) by mouth daily at 6 PM. (Patient not taking: Reported on  12/26/2021) 30 tablet 11 Completed Course  ? ? ?Assessment: ?66 y.o. female with medical history significant for PAD s/p femoral endarterectomy w/ iliac stenting in 2019 who presented to ED on 4/08 with abdominal pain and CTa demonstrating occlusion. Messenteric ishcemia now s/p vascular intervention. ? ?Hgb stable, platelets stable, moderate hematoma at incisional site however appears stable and smaller this morning per VVS. No other s/sx bleeding noted.  ?Plan is for DOAC and Plavix at discharge. ?Pharmacy consulted to transition from heparin to Xarelto for discharge today. ? ?Goal of Therapy:  ?Therapeutic anticoagulation ?Monitor platelets by anticoagulation protocol: Yes ?  ?Plan:  ?Stop heparin infusion ?Start Xarelto 15 mg PO twice daily with meals x 21 days, followed by 20 mg daily with meal. ?Monitor renal function and for signs/symptoms of bleeding ?Patient education ? ? ?Thank you for allowing 6/08 to participate in this patients care. ?[place], PharmD ?12/31/2021 11:45 AM ? ?**Pharmacist phone directory can be found on [email or web address] listed under [facility]** ? ? ? ?

## 2021-12-31 NOTE — Progress Notes (Signed)
Mobility Specialist Progress Note ? ? 12/31/21 1700  ?Mobility  ?Activity Ambulated with assistance in hallway  ?Level of Assistance Standby assist, set-up cues, supervision of patient - no hands on  ?Assistive Device Front wheel walker  ?LUE Weight Bearing WBAT  ?Distance Ambulated (ft) 230 ft  ?Activity Response Tolerated well  ?$Mobility charge 1 Mobility  ? ?Received pt in bed w/ no c/o and agreeable. Requesting to use BR before ambulation. Successful void. No physical assist need throughout but session limited d/t queasy feeling pt had and requesting to go back to BR. Left in BR w/ RN notified about position.   ? ?Frederico Hamman ?Mobility Specialist ?Phone Number [phone] ? ?

## 2022-01-01 ENCOUNTER — Inpatient Hospital Stay: Payer: PRIVATE HEALTH INSURANCE | Admitting: Nurse Practitioner

## 2022-01-06 ENCOUNTER — Telehealth (HOSPITAL_COMMUNITY): Payer: Self-pay

## 2022-01-06 NOTE — Telephone Encounter (Signed)
TOC OUTPATIENT PHARMACY FOLLOW-UP ?Call #1 attempted for TOC pharmacy follow-up.  Was unable to leave VM. Will follow up in 2-3 days.  ? ?

## 2022-01-08 ENCOUNTER — Telehealth: Payer: Self-pay

## 2022-01-08 ENCOUNTER — Other Ambulatory Visit: Payer: Self-pay

## 2022-01-08 NOTE — Telephone Encounter (Signed)
Transitions of Care Pharmacy  ? ?Call attempted for a pharmacy transitions of care follow-up. \ ? ?Call attempt #2. Will follow-up in 2-3 days.  ?  ?Jiles Crocker, PharmD ?Clinical Pharmacist ?[facility] ?01/08/2022 3:03 PM ? ?

## 2022-01-09 ENCOUNTER — Other Ambulatory Visit (HOSPITAL_COMMUNITY): Payer: Self-pay

## 2022-01-09 ENCOUNTER — Telehealth (HOSPITAL_COMMUNITY): Payer: Self-pay

## 2022-01-09 NOTE — Telephone Encounter (Signed)
Transitions of Care Pharmacy  ? ?Call attempted for a pharmacy transitions of care follow-up.   ? ?Call attempt #3. Final attempt. ? ?Darcus Austin, PharmD ?Clinical Pharmacist ?[facility] ?01/09/2022 3:10 PM ?  ?  ? ? ?

## 2022-01-21 ENCOUNTER — Other Ambulatory Visit (HOSPITAL_COMMUNITY): Payer: Self-pay

## 2022-01-21 ENCOUNTER — Ambulatory Visit: Payer: Self-pay | Admitting: *Deleted

## 2022-01-21 NOTE — Telephone Encounter (Signed)
Summary: HFU missed/ toestablish pt out of ongoing med  ? Pt has missed her HFU visit for 4/14/[place]. Pt is upset as she was given a med rivaroxaban (XARELTO) 20 MG TABS tablet that she states was to take the rest of her life. Pt wants an appt this week as only has 1 left, this FU was to establish care that was missed, not been seen at [facility] FU with pt at [phone]   ?  ? ? ?Called patient to review options for medication refill and to establish care. No answer, voicemail box has not been set up. Unable to leave message.  ?

## 2022-01-21 NOTE — Telephone Encounter (Signed)
Third attempt to reach patient- no answer on number provided. ? Patient cell number has message call can not be completed at this time. ?

## 2022-01-21 NOTE — Telephone Encounter (Signed)
Second attempt to reach patient on number provided. No answer- voice mail not set up- unable to leave message ?

## 2022-02-10 ENCOUNTER — Inpatient Hospital Stay (INDEPENDENT_AMBULATORY_CARE_PROVIDER_SITE_OTHER): Payer: PRIVATE HEALTH INSURANCE | Admitting: Primary Care

## 2022-02-22 ENCOUNTER — Other Ambulatory Visit: Payer: Self-pay

## 2022-02-22 ENCOUNTER — Ambulatory Visit (INDEPENDENT_AMBULATORY_CARE_PROVIDER_SITE_OTHER): Payer: PRIVATE HEALTH INSURANCE | Admitting: Primary Care

## 2022-02-22 ENCOUNTER — Encounter (INDEPENDENT_AMBULATORY_CARE_PROVIDER_SITE_OTHER): Payer: Self-pay | Admitting: Primary Care

## 2022-02-22 VITALS — BP 192/77 | HR 77 | Temp 97.8°F | Ht 64.0 in | Wt 133.6 lb

## 2022-02-22 DIAGNOSIS — I10 Essential (primary) hypertension: Secondary | ICD-10-CM

## 2022-02-22 DIAGNOSIS — Z6822 Body mass index (BMI) 22.0-22.9, adult: Secondary | ICD-10-CM | POA: Diagnosis not present

## 2022-02-22 DIAGNOSIS — K559 Vascular disorder of intestine, unspecified: Secondary | ICD-10-CM

## 2022-02-22 DIAGNOSIS — Z131 Encounter for screening for diabetes mellitus: Secondary | ICD-10-CM

## 2022-02-22 DIAGNOSIS — Z7689 Persons encountering health services in other specified circumstances: Secondary | ICD-10-CM

## 2022-02-22 DIAGNOSIS — Z09 Encounter for follow-up examination after completed treatment for conditions other than malignant neoplasm: Secondary | ICD-10-CM

## 2022-02-22 DIAGNOSIS — Z760 Encounter for issue of repeat prescription: Secondary | ICD-10-CM

## 2022-02-22 LAB — POCT GLYCOSYLATED HEMOGLOBIN (HGB A1C): Hemoglobin A1C: 5.5 % (ref 4.0–5.6)

## 2022-02-22 MED ORDER — HYDROCHLOROTHIAZIDE 12.5 MG PO CAPS
12.5000 mg | ORAL_CAPSULE | Freq: Every day | ORAL | 1 refills | Status: DC
Start: 2022-02-22 — End: 2022-05-25
  Filled 2022-02-22: qty 30, 30d supply, fill #0
  Filled 2022-03-22: qty 30, 30d supply, fill #1
  Filled 2022-04-30: qty 30, 30d supply, fill #2

## 2022-02-22 MED ORDER — AMLODIPINE BESYLATE 10 MG PO TABS
10.0000 mg | ORAL_TABLET | Freq: Every day | ORAL | 1 refills | Status: DC
  Filled 2022-02-22: qty 30, 30d supply, fill #0
  Filled 2022-03-22 – 2022-03-30 (×2): qty 30, 30d supply, fill #1
  Filled 2022-04-30: qty 30, 30d supply, fill #2

## 2022-02-22 MED ORDER — HYDROCHLOROTHIAZIDE 25 MG PO TABS
25.0000 mg | ORAL_TABLET | Freq: Every day | ORAL | 3 refills | Status: DC
  Filled 2022-02-22: qty 90, 90d supply, fill #0

## 2022-02-22 MED ORDER — CLOPIDOGREL BISULFATE 75 MG PO TABS
75.0000 mg | ORAL_TABLET | Freq: Every day | ORAL | 1 refills | Status: DC
  Filled 2022-02-22: qty 30, 30d supply, fill #0

## 2022-02-22 NOTE — Progress Notes (Signed)
[facility]   Subjective:  [name] is a 66 y.o. female presents for hospital follow up and establish care. Admit date to the hospital was 12/26/21, patient was discharged from the hospital on 12/31/21, patient was admitted for: PAD (peripheral artery disease) Mesenteric ischemia (HCC), HTN (hypertension) and Leukocytosis. Patient has No headache, No chest pain, No abdominal pain - No Nausea, No new weakness tingling or numbness, No Cough - shortness of breath   Past Medical History:  Diagnosis Date   Hypertension    PVD (peripheral vascular disease) (HCC)      No Known Allergies    Current Outpatient Medications on File Prior to Visit  Medication Sig Dispense Refill   acetaminophen (TYLENOL) 325 MG tablet Take 2 tablets (650 mg total) by mouth every 6 (six) hours as needed for mild pain (or Fever >/= 101). (Patient not taking: Reported on 02/22/2022)     aspirin EC 81 MG EC tablet Take 1 tablet (81 mg total) by mouth daily. Swallow whole. (Patient not taking: Reported on 02/22/2022) 30 tablet 11   clopidogrel (PLAVIX) 75 MG tablet Take 1 tablet (75 mg total) by mouth daily. (Patient not taking: Reported on 02/22/2022) 30 tablet 1   rivaroxaban (XARELTO) 20 MG TABS tablet Take 1 tablet (20 mg total) by mouth daily with supper. To begin AFTER YOU FINISH the XARELTO STARTER PACK (Patient not taking: Reported on 02/22/2022) 30 tablet 1   No current facility-administered medications on file prior to visit.     Review of System: Comprehensive ROS Pertinent positive and negative noted in HPI    Objective:  BP (!) 192/77   Pulse 77   Temp 97.8 F (36.6 C) (Oral)   Ht 5\' 4"  (1.626 m)   Wt 133 lb 9.6 oz (60.6 kg)   SpO2 100%   BMI 22.93 kg/m   Filed Weights   02/22/22 1441  Weight: 133 lb 9.6 oz (60.6 kg)   Physical Exam: General Appearance: Well nourished, in no apparent distress. Eyes: PERRLA, EOMs, conjunctiva no swelling or erythema Sinuses: No  Frontal/maxillary tenderness ENT/Mouth: Ext aud canals clear, TMs without erythema, bulging.  Hearing normal.  Neck: Supple, thyroid normal.  Respiratory: Respiratory effort normal, BS equal bilaterally without rales, rhonchi, wheezing or stridor.  Cardio: RRR with no MRGs. Brisk peripheral pulses without edema.  Abdomen: Soft, + BS.  Non tender, no guarding, rebound, hernias, masses. Lymphatics: Non tender without lymphadenopathy.  Musculoskeletal: Full ROM, 5/5 strength, normal gait.  Skin: Warm, dry without rashes, lesions, ecchymosis.  Neuro: Cranial nerves intact. Normal muscle tone, no cerebellar symptoms. Sensation intact.  Psych: Awake and oriented X 3, normal affect, Insight and Judgment appropriate.    Assessment:  Danielle Morton was seen today for hospitalization follow-up.  Diagnoses and all orders for this visit:    Encounter to establish care Establish care with PCP  Screening for diabetes mellitus  -     HgB A1c 5.5 per ADA guidelines not pre or diabetic   Mesenteric ischemia (HCC) -     clopidogrel (PLAVIX) 75 MG tablet; Take 1 tablet (75 mg total) by mouth daily. -     Ambulatory Referral for Peripheral Vascular Consult  Hypertension, unspecified type We have discussed target BP range and blood pressure goal. I have advised patient to check BP regularly and to call [name] back or report to clinic if the numbers are consistently higher than 140/90. We discussed the importance of compliance with medical therapy and DASH diet  recommended, consequences of uncontrolled hypertension discussed.  - continue current BP medications  -     Discontinue: hydrochlorothiazide (HYDRODIURIL) 25 MG tablet; Take 1 tablet (25 mg total) by mouth daily.  BMI 22.0-22.9, adult Discussed diet and exercise for person with BMI >25. Instructed: You must burn more calories than you eat. Losing 5 percent of your body weight should be considered a success. In the longer term, losing more than 15 percent of  your body weight and staying at this weight is an extremely good result. However, keep in mind that even losing 5 percent of your body weight leads to important health benefits, so try not to get discouraged if you're not able to lose more than this.  [facility] discharge follow-up Retrieved from d/c summary  Go to [facility] on 01/01/2022; Hospital f/u at 9:50am with Loreen Freud  and can establish a primary care doctor, they have finacial counselling and a pharmacy as well--please arrive 48m in prior to appointment and bring ID- also take your Xarelto application for pt assistance.- reschedule Follow up with Victorino Sparrow, MD (Vascular Surgery); The office will arrange for a follow up appointment  Other orders/Medication refill -     amLODipine (NORVASC) 10 MG tablet; Take 1 tablet (10 mg total) by mouth daily. -     hydrochlorothiazide (MICROZIDE) 12.5 MG capsule; Take 1 capsule (12.5 mg total) by mouth daily.    This note has been created with Education officer, environmental. Any transcriptional errors are unintentional.   Grayce Sessions, NP 02/22/2022, 3:18 PM

## 2022-02-23 ENCOUNTER — Other Ambulatory Visit: Payer: Self-pay

## 2022-03-05 ENCOUNTER — Ambulatory Visit: Payer: PRIVATE HEALTH INSURANCE | Admitting: Cardiovascular Disease

## 2022-03-09 ENCOUNTER — Other Ambulatory Visit: Payer: Self-pay

## 2022-03-09 ENCOUNTER — Other Ambulatory Visit (INDEPENDENT_AMBULATORY_CARE_PROVIDER_SITE_OTHER): Payer: Self-pay | Admitting: Primary Care

## 2022-03-09 DIAGNOSIS — K559 Vascular disorder of intestine, unspecified: Secondary | ICD-10-CM

## 2022-03-09 MED ORDER — RIVAROXABAN 20 MG PO TABS
20.0000 mg | ORAL_TABLET | Freq: Every day | ORAL | 2 refills | Status: DC
  Filled 2022-03-09: qty 14, 14d supply, fill #0
  Filled 2022-03-18 – 2022-03-30 (×3): qty 30, 30d supply, fill #1
  Filled 2022-04-30: qty 30, 30d supply, fill #2

## 2022-03-15 ENCOUNTER — Other Ambulatory Visit: Payer: Self-pay

## 2022-03-16 ENCOUNTER — Encounter: Payer: Self-pay | Admitting: Cardiovascular Disease

## 2022-03-16 ENCOUNTER — Ambulatory Visit (INDEPENDENT_AMBULATORY_CARE_PROVIDER_SITE_OTHER): Payer: PRIVATE HEALTH INSURANCE | Admitting: Cardiovascular Disease

## 2022-03-16 ENCOUNTER — Other Ambulatory Visit: Payer: Self-pay

## 2022-03-16 VITALS — BP 132/80 | HR 88 | Ht 64.0 in | Wt 132.0 lb

## 2022-03-16 DIAGNOSIS — I739 Peripheral vascular disease, unspecified: Secondary | ICD-10-CM

## 2022-03-16 DIAGNOSIS — Z720 Tobacco use: Secondary | ICD-10-CM | POA: Diagnosis not present

## 2022-03-16 DIAGNOSIS — K559 Vascular disorder of intestine, unspecified: Secondary | ICD-10-CM | POA: Diagnosis not present

## 2022-03-16 DIAGNOSIS — R0989 Other specified symptoms and signs involving the circulatory and respiratory systems: Secondary | ICD-10-CM | POA: Diagnosis not present

## 2022-03-16 DIAGNOSIS — I10 Essential (primary) hypertension: Secondary | ICD-10-CM

## 2022-03-16 NOTE — Assessment & Plan Note (Signed)
History of mesenteric ischemia with recent endovascular procedure by Dr. Bernarda Caffey  4//10/23 of her SMA for a distal SMA occlusion.  She no longer has symptoms of mesenteric ischemia.  This was done via the left brachial approach.

## 2022-03-16 NOTE — Assessment & Plan Note (Signed)
20-pack-year tobacco abuse having quit several months ago at the time of her mesenteric intervention.

## 2022-03-16 NOTE — Assessment & Plan Note (Signed)
History of PAD status post left common femoral endarterectomy with retrograde stenting of her common and external iliac arteries by Dr. Randie Heinz in the past who follows her for this.

## 2022-03-17 ENCOUNTER — Telehealth: Payer: Self-pay | Admitting: Cardiovascular Disease

## 2022-03-17 NOTE — Telephone Encounter (Signed)
Patient called and wanted to let Dr. Allyson Sabal and nurse know that she is still working. Dr. Allyson Sabal had asked her that question from appt on 03/16/22

## 2022-03-17 NOTE — Telephone Encounter (Signed)
Will route FYI from the pt to Dr. Allyson Sabal and covering RN for their review.

## 2022-03-18 ENCOUNTER — Other Ambulatory Visit: Payer: Self-pay

## 2022-03-22 ENCOUNTER — Other Ambulatory Visit: Payer: Self-pay

## 2022-03-22 ENCOUNTER — Ambulatory Visit (HOSPITAL_BASED_OUTPATIENT_CLINIC_OR_DEPARTMENT_OTHER)
Admission: RE | Admit: 2022-03-22 | Discharge: 2022-03-22 | Disposition: A | Discharge status: Home / Self Care | Payer: PRIVATE HEALTH INSURANCE | Source: Ambulatory Visit | Attending: Cardiovascular Disease | Admitting: Cardiovascular Disease

## 2022-03-22 DIAGNOSIS — R931 Abnormal findings on diagnostic imaging of heart and coronary circulation: Secondary | ICD-10-CM

## 2022-03-22 DIAGNOSIS — Z720 Tobacco use: Secondary | ICD-10-CM

## 2022-03-22 DIAGNOSIS — K559 Vascular disorder of intestine, unspecified: Secondary | ICD-10-CM

## 2022-03-22 DIAGNOSIS — I10 Essential (primary) hypertension: Secondary | ICD-10-CM

## 2022-03-22 DIAGNOSIS — I739 Peripheral vascular disease, unspecified: Secondary | ICD-10-CM

## 2022-03-22 HISTORY — DX: Abnormal findings on diagnostic imaging of heart and coronary circulation: R93.1

## 2022-03-26 ENCOUNTER — Other Ambulatory Visit: Payer: Self-pay

## 2022-03-30 ENCOUNTER — Ambulatory Visit: Payer: PRIVATE HEALTH INSURANCE | Attending: Internal Medicine | Admitting: Pharmacist

## 2022-03-30 ENCOUNTER — Encounter: Payer: Self-pay | Admitting: Pharmacist

## 2022-03-30 ENCOUNTER — Other Ambulatory Visit: Payer: Self-pay

## 2022-03-30 VITALS — BP 162/104

## 2022-03-30 DIAGNOSIS — I10 Essential (primary) hypertension: Secondary | ICD-10-CM

## 2022-03-30 NOTE — Progress Notes (Signed)
   S:     No chief complaint on file.  Danielle Morton is a 66 y.o. female who presents for hypertension evaluation, education, and management. PMH is significant for PVD, mesenteric ischemia, CAD, HTN and prior tobacco use. Patient was referred and last seen by Primary Care Provider, [name], on 03/16/2022. She started HCTZ and amlodipine at that visit.  Today, patient arrives in good spirits and presents without assistance. Denies dizziness, headache, blurred vision, swelling.   Patient reports hypertension is longstanding. Was recently seen by Dr. Allyson Sabal and her BP was at goal. No changes were made to her antihypertensives.    Family/Social history:  Fhx: no pertinent positives noted  Tobacco: former smoker (stopped ~9 months ago) Alcohol: none reported   Medication adherence reported with HCTZ. She ran out of amlodipine and plans to pick-up this today. Patient has taken her HCTZ today.   Current antihypertensives include: amlodipine 10 mg daily, HCTZ 12.5 mg daily   Reported home BP readings: does not check at home  Patient reported dietary habits:  -Admits to heavy salt intake (snacks, takeout and fast foods) -Does not drink excessive amounts of caffeine   Patient-reported exercise habits: none outside of work   O:  Vitals:   03/30/22 1521  BP: (!) 162/104   Last 3 Office BP readings: BP Readings from Last 3 Encounters:  03/30/22 (!) 162/104  03/16/22 132/80  02/22/22 (!) 192/77    BMET    Component Value Date/Time   NA 136 12/31/2021 0228   K 3.7 12/31/2021 0228   CL 106 12/31/2021 0228   CO2 23 12/31/2021 0228   GLUCOSE 105 (H) 12/31/2021 0228   BUN <5 (L) 12/31/2021 0228   CREATININE 0.43 (L) 12/31/2021 0228   CALCIUM 9.3 12/31/2021 0228   GFRNONAA >60 12/31/2021 0228   GFRAA >60 08/16/2018 0302    Renal function: CrCl cannot be calculated (Patient's most recent lab result is older than the maximum 21 days allowed.).  Clinical ASCVD: PAD, CAD The  ASCVD Risk score (Arnett DK, et al., 2019) failed to calculate for the following reasons:   Cannot find a previous HDL lab   Cannot find a previous total cholesterol lab   A/P: Hypertension longstanding currently above goal on current medications. BP goal < 130/80 mmHg. Medication adherence reported to HCTZ but she ran out of amlodipine several days ago. Will have her resume this and continue HCTZ. - Resume  amlodipine 10 mg daily. -Continue HCTZ 12.5 mg daily.  -Patient educated on purpose, proper use, and potential adverse effects of amlodipine, HCTZ.  -Counseled on lifestyle modifications for blood pressure control including reduced dietary sodium, increased exercise, adequate sleep. -Encouraged patient to check BP at home and bring log of readings to next visit. Counseled on proper use of home BP cuff.   Results reviewed and written information provided.    Written patient instructions provided. Patient verbalized understanding of treatment plan.  Total time in face to face counseling 30 minutes.    Follow-up: PCP clinic visit on 05/25/2022.  Butch Penny, PharmD, Patsy [place], CPP Clinical Pharmacist [facility] [phone]

## 2022-03-31 ENCOUNTER — Encounter (HOSPITAL_BASED_OUTPATIENT_CLINIC_OR_DEPARTMENT_OTHER): Payer: PRIVATE HEALTH INSURANCE

## 2022-04-12 ENCOUNTER — Ambulatory Visit (INDEPENDENT_AMBULATORY_CARE_PROVIDER_SITE_OTHER): Payer: PRIVATE HEALTH INSURANCE

## 2022-04-12 DIAGNOSIS — R0989 Other specified symptoms and signs involving the circulatory and respiratory systems: Secondary | ICD-10-CM | POA: Diagnosis not present

## 2022-04-13 ENCOUNTER — Telehealth: Payer: Self-pay | Admitting: Licensed Clinical Social Worker

## 2022-04-13 NOTE — Telephone Encounter (Signed)
H&V Care Navigation CSW Progress Note  Clinical Social Worker contacted patient by phone to f/u on request for information about transportation at [phone]. Pt phone number is not in service. I then attempted her twin sister [name] at [phone], no answer and voicemail is not set up. I will mail pt resources to current listed address, does not have additional Heart/Vas appointments at this time but should she need specific assistance our office can arrange as needed.    Patient is participating in a Managed Medicaid Plan:  No, generic commercial only listed at this time.   SDOH Screenings   Alcohol Screen: Not on file  Depression (YQI3-4): Not on file  Financial Resource Strain: Not on file  Food Insecurity: Not on file  Housing: Not on file  Physical Activity: Not on file  Social Connections: Not on file  Stress: Not on file  Tobacco Use: High Risk (03/30/2022)   Patient History    Smoking Tobacco Use: Every Day    Smokeless Tobacco Use: Never    Passive Exposure: Not on file  Transportation Needs: Not on file    Octavio Graves, MSW, LCSW Clinical Social Worker II [facility] Heart/Vascular Care Navigation  [phone]- work cell phone (preferred) [phone]- desk phone

## 2022-04-15 ENCOUNTER — Telehealth: Payer: Self-pay | Admitting: Licensed Clinical Social Worker

## 2022-04-15 NOTE — Telephone Encounter (Signed)
H&V Care Navigation CSW Progress Note  Clinical Social Worker contacted patient by phone to f/u again regarding transportation. Pt phone remains out of service, unable to reach pt sister [name] as her voicemail is not set up. This time I was able to reach pt son Glenis Smoker (DPR on file for pt sister and son). He did not provide me with a new number for pt, was made aware that pt phone not in service. He confirmed home address and I shared that I had sent my card, transportation services list to that address. Pt will need to contact offices prior to appts to arrange transportation assistance if none of those assistance options are available. Pt son states understanding will alert pt. Will not actively follow at this time but remain available.    Patient is participating in a Managed Medicaid Plan:  No, generic commercial insurance only.   SDOH Screenings   Alcohol Screen: Not on file  Depression (NWG9-5): Not on file  Financial Resource Strain: Not on file  Food Insecurity: Not on file  Housing: Not on file  Physical Activity: Not on file  Social Connections: Not on file  Stress: Not on file  Tobacco Use: High Risk (03/30/2022)   Patient History    Smoking Tobacco Use: Every Day    Smokeless Tobacco Use: Never    Passive Exposure: Not on file  Transportation Needs: Unmet Transportation Needs (04/15/2022)   PRAPARE - Administrator, Civil Service (Medical): Yes    Lack of Transportation (Non-Medical): Yes    Octavio Graves, MSW, LCSW Clinical Social Worker II [facility] Heart/Vascular Care Navigation  [phone]- work cell phone (preferred) [phone]- desk phone

## 2022-04-19 ENCOUNTER — Encounter: Payer: Self-pay | Admitting: *Deleted

## 2022-04-30 ENCOUNTER — Other Ambulatory Visit: Payer: Self-pay

## 2022-05-12 ENCOUNTER — Other Ambulatory Visit: Payer: Self-pay

## 2022-05-12 NOTE — Progress Notes (Unsigned)
Error

## 2022-05-12 NOTE — Progress Notes (Signed)
Patient appearing on report for [employer] [facility] - Hypertension Control report due to last documented ambulatory blood pressure of 162/104 on 03/30/2022. Next appointment with PCP is 05/25/2022.   Outreached patient to discuss hypertension control and medication management.   Current antihypertensives: amlodipine 5 mg daily, HCTZ 12.5 mg daily  Patient does not have an automated upper arm home BP machine.  Patient denies hypotensive signs and symptoms including dizziness, lightheadedness.  Patient denies hypertensive symptoms including headache, chest pain, shortness of breath.  Patient denies side effects related to BP medications   Assessment/Plan: - Currently uncontrolled based on last OV BP - Reviewed goal blood pressure <130/80 - Reviewed appropriate administration of medication regimen - Recommend patient take BP medications prior to appointment on  05/25/2022.  Valeda Malm, Pharm.D. PGY-2 Ambulatory Care Pharmacy Resident 05/12/2022 11:49 AM

## 2022-05-25 ENCOUNTER — Encounter (INDEPENDENT_AMBULATORY_CARE_PROVIDER_SITE_OTHER): Payer: Self-pay | Admitting: Primary Care

## 2022-05-25 ENCOUNTER — Other Ambulatory Visit: Payer: Self-pay

## 2022-05-25 ENCOUNTER — Ambulatory Visit (INDEPENDENT_AMBULATORY_CARE_PROVIDER_SITE_OTHER): Payer: Self-pay | Admitting: Primary Care

## 2022-05-25 VITALS — BP 140/84 | HR 71 | Temp 98.4°F | Resp 18 | Ht 64.0 in | Wt 138.0 lb

## 2022-05-25 DIAGNOSIS — Z1211 Encounter for screening for malignant neoplasm of colon: Secondary | ICD-10-CM

## 2022-05-25 DIAGNOSIS — Z6822 Body mass index (BMI) 22.0-22.9, adult: Secondary | ICD-10-CM

## 2022-05-25 DIAGNOSIS — Z1231 Encounter for screening mammogram for malignant neoplasm of breast: Secondary | ICD-10-CM

## 2022-05-25 DIAGNOSIS — Z13220 Encounter for screening for lipoid disorders: Secondary | ICD-10-CM

## 2022-05-25 DIAGNOSIS — I10 Essential (primary) hypertension: Secondary | ICD-10-CM

## 2022-05-25 DIAGNOSIS — Z79899 Other long term (current) drug therapy: Secondary | ICD-10-CM

## 2022-05-25 DIAGNOSIS — Z23 Encounter for immunization: Secondary | ICD-10-CM

## 2022-05-25 MED ORDER — AMLODIPINE BESYLATE 10 MG PO TABS
10.0000 mg | ORAL_TABLET | Freq: Every day | ORAL | 1 refills | Status: DC
  Filled 2022-05-25: qty 30, 30d supply, fill #0
  Filled 2022-05-26 – 2022-06-15 (×2): qty 90, 90d supply, fill #0
  Filled 2022-10-22: qty 90, 90d supply, fill #1

## 2022-05-25 MED ORDER — RIVAROXABAN 20 MG PO TABS
20.0000 mg | ORAL_TABLET | Freq: Every day | ORAL | 1 refills | Status: DC
Start: 2022-05-25 — End: 2024-01-12
  Filled 2022-05-25: qty 30, 30d supply, fill #0
  Filled 2022-06-15: qty 90, 90d supply, fill #0

## 2022-05-25 MED ORDER — HYDROCHLOROTHIAZIDE 12.5 MG PO CAPS
12.5000 mg | ORAL_CAPSULE | Freq: Every day | ORAL | 1 refills | Status: DC
  Filled 2022-05-25: qty 30, 30d supply, fill #0
  Filled 2022-06-15: qty 90, 90d supply, fill #0
  Filled 2022-10-22: qty 90, 90d supply, fill #1

## 2022-05-25 NOTE — Patient Instructions (Addendum)
Influenza, Adult Influenza is also called "the flu." It is an infection in the lungs, nose, and throat (respiratory tract). It spreads easily from person to person (is contagious). The flu causes symptoms that are like a cold, along with high fever and body aches. What are the causes? This condition is caused by the influenza virus. You can get the virus by: Breathing in droplets that are in the air after a person infected with the flu coughed or sneezed. Touching something that has the virus on it and then touching your mouth, nose, or eyes. What increases the risk? Certain things may make you more likely to get the flu. These include: Not washing your hands often. Having close contact with many people during cold and flu season. Touching your mouth, eyes, or nose without first washing your hands. Not getting a flu shot every year. You may have a higher risk for the flu, and serious problems, such as a lung infection (pneumonia), if you: Are older than 65. Are pregnant. Have a weakened disease-fighting system (immune system) because of a disease or because you are taking certain medicines. Have a long-term (chronic) condition, such as: Heart, kidney, or lung disease. Diabetes. Asthma. Have a liver disorder. Are very overweight (morbidly obese). Have anemia. What are the signs or symptoms? Symptoms usually begin suddenly and last 4-14 days. They may include: Fever and chills. Headaches, body aches, or muscle aches. Sore throat. Cough. Runny or stuffy (congested) nose. Feeling discomfort in your chest. Not wanting to eat as much as normal. Feeling weak or tired. Feeling dizzy. Feeling sick to your stomach or throwing up. How is this treated? If the flu is found early, you can be treated with antiviral medicine. This can help to reduce how bad the illness is and how long it lasts. This may be given by mouth or through an IV tube. Taking care of yourself at home can help your  symptoms get better. Your doctor may want you to: Take over-the-counter medicines. Drink plenty of fluids. The flu often goes away on its own. If you have very bad symptoms or other problems, you may be treated in a hospital. Follow these instructions at home:     Activity Rest as needed. Get plenty of sleep. Stay home from work or school as told by your doctor. Do not leave home until you do not have a fever for 24 hours without taking medicine. Leave home only to go to your doctor. Eating and drinking Take an ORS (oral rehydration solution). This is a drink that is sold at pharmacies and stores. Drink enough fluid to keep your pee pale yellow. Drink clear fluids in small amounts as you are able. Clear fluids include: Water. Ice chips. Fruit juice mixed with water. Low-calorie sports drinks. Eat bland foods that are easy to digest. Eat small amounts as you are able. These foods include: Bananas. Applesauce. Rice. Lean meats. Toast. Crackers. Do not eat or drink: Fluids that have a lot of sugar or caffeine. Alcohol. Spicy or fatty foods. General instructions Take over-the-counter and prescription medicines only as told by your doctor. Use a cool mist humidifier to add moisture to the air in your home. This can make it easier for you to breathe. When using a cool mist humidifier, clean it daily. Empty water and replace with clean water. Cover your mouth and nose when you cough or sneeze. Wash your hands with soap and water often and for at least 20 seconds. This is also   important after you cough or sneeze. If you cannot use soap and water, use alcohol-based hand sanitizer. Keep all follow-up visits. How is this prevented?  Get a flu shot every year. You may get the flu shot in late summer, fall, or winter. Ask your doctor when you should get your flu shot. Avoid contact with people who are sick during fall and winter. This is cold and flu season. Contact a doctor if: You  get new symptoms. You have: Chest pain. Watery poop (diarrhea). A fever. Your cough gets worse. You start to have more mucus. You feel sick to your stomach. You throw up. Get help right away if you: Have shortness of breath. Have trouble breathing. Have skin or nails that turn a bluish color. Have very bad pain or stiffness in your neck. Get a sudden headache. Get sudden pain in your face or ear. Cannot eat or drink without throwing up. These symptoms may represent a serious problem that is an emergency. Get medical help right away. Call your local emergency services (911 in the [place] Do not wait to see if the symptoms will go away. Do not drive yourself to the hospital. Summary Influenza is also called "the flu." It is an infection in the lungs, nose, and throat. It spreads easily from person to person. Take over-the-counter and prescription medicines only as told by your doctor. Getting a flu shot every year is the best way to not get the flu. This information is not intended to replace advice given to you by your health care provider. Make sure you discuss any questions you have with your health care provider. Document Revised: 04/25/2020 Document Reviewed: 04/25/2020 Elsevier Patient Education  2023 [employer]. High-Protein and High-Calorie Diet Eating high-protein and high-calorie foods can help you to gain weight, heal after an injury, and recover after an illness or surgery. The specific amount of daily protein and calories you need depends on: Your body weight. The reason this diet is recommended for you. Generally, a high-protein, high-calorie diet involves: Eating 250-500 extra calories each day. Making sure that you get enough of your daily calories from protein. Ask your health care provider how many of your calories should come from protein. Talk with a health care provider or a dietitian about how much protein and how many calories you need each day. Follow the  diet as directed by your health care provider. What are tips for following this plan?  Reading food labels Check the nutrition facts label for calories, grams of fat and protein. Items with more than 4 grams of protein are high-protein foods. Preparing meals Add whole milk, half-and-half, or heavy cream to cereal, pudding, soup, or hot cocoa. Add whole milk to instant breakfast drinks. Add peanut butter to oatmeal or smoothies. Add powdered milk to baked goods, smoothies, or milkshakes. Add powdered milk, cream, or butter to mashed potatoes. Add cheese to cooked vegetables. Make whole-milk yogurt parfaits. Top them with granola, fruit, or nuts. Add cottage cheese to fruit. Add avocado, cheese, or both to sandwiches or salads. Add avocado to smoothies. Add meat, poultry, or seafood to rice, pasta, casseroles, salads, and soups. Use mayonnaise when making egg salad, chicken salad, or tuna salad. Use peanut butter as a dip for fruits and vegetables or as a topping for pretzels, celery, or crackers. Add beans to casseroles, dips, and spreads. Add pureed beans to sauces and soups. Replace calorie-free drinks with calorie-containing drinks, such as milk and fruit juice. Replace water with milk  or heavy cream when making foods such as oatmeal, pudding, or cocoa. Add oil or butter to cooked vegetables and grains. Add cream cheese to sandwiches or as a topping on crackers and bread. Make cream-based pastas and soups. General information Ask your health care provider if you should take a nutritional supplement. Try to eat six small meals each day instead of three large meals. A general goal is to eat every 2 to 3 hours. Eat a balanced diet. In each meal, include one food that is high in protein and one food with fat in it. Keep nutritious snacks available, such as nuts, trail mixes, dried fruit, and yogurt. If you have kidney disease or diabetes, talk with your health care provider about how much  protein is safe for you. Too much protein may put extra stress on your kidneys. Drink your calories. Choose high-calorie drinks and have them after your meals. Consider setting a timer to remind you to eat. You will want to eat even if you do not feel very hungry. What high-protein foods should I eat?  Vegetables Soybeans. Peas. Grains Quinoa. Bulgur wheat. Buckwheat. Meats and other proteins Beef, pork, and poultry. Fish and seafood. Eggs. Tofu. Textured vegetable protein (TVP). Peanut butter. Nuts and seeds. Dried beans. Protein powders. Hummus. Dairy Whole milk. Whole-milk yogurt. Powdered milk. Cheese. [employer]. Eggnog. Beverages High-protein supplement drinks. Soy milk. Other foods Protein bars. The items listed above may not be a complete list of foods and beverages you can eat and drink. Contact a dietitian for more information. What high-calorie foods should I eat? Fruits Dried fruit. Fruit leather. Canned fruit in syrup. Fruit juice. Avocado. Vegetables Vegetables cooked in oil or butter. Fried potatoes. Grains Pasta. Quick breads. Muffins. Pancakes. Ready-to-eat cereal. Meats and other proteins Peanut butter. Nuts and seeds. Dairy Heavy cream. Whipped cream. Cream cheese. Sour cream. Ice cream. Custard. Pudding. Whole milk dairy products. Beverages Meal-replacement beverages. Nutrition shakes. Fruit juice. Seasonings and condiments Salad dressing. Mayonnaise. Alfredo sauce. Fruit preserves or jelly. Honey. Syrup. Sweets and desserts Cake. Cookies. Pie. Pastries. Candy bars. Chocolate. Fats and oils Butter or margarine. Oil. Gravy. Other foods Meal-replacement bars. The items listed above may not be a complete list of foods and beverages you can eat and drink. Contact a dietitian for more information. Summary A high-protein, high-calorie diet can help you gain weight or heal faster after an injury, illness, or surgery. To increase your protein and calories,  add ingredients such as whole milk, peanut butter, cheese, beans, meat, or seafood to meal items. To get enough extra calories each day, include high-calorie foods and beverages at each meal. Adding a high-calorie drink or shake can be an easy way to help you get enough calories each day. Talk with your healthcare provider or dietitian about the best options for you. This information is not intended to replace advice given to you by your health care provider. Make sure you discuss any questions you have with your health care provider. Document Revised: 08/10/2020 Document Reviewed: 08/10/2020 Elsevier Patient Education  2023 [employer].

## 2022-05-25 NOTE — Progress Notes (Signed)
[place]   Mr. Danielle Morton is a 66 y.o. female presents for hypertension evaluation, Denies shortness of breath, headaches, chest pain or lower extremity edema, sudden onset, vision changes, unilateral weakness, dizziness, paresthesias   Patient reports adherence with medications.  Dietary habits include: monitor sodium, reading labs, no can foods Exercise habits include:yes walking Family / Social history: Father MI   Past Medical History:  Diagnosis Date   Hypertension    PVD (peripheral vascular disease) ([place])    Past Surgical History:  Procedure Laterality Date   ANGIOPLASTY N/A 12/28/2021   Procedure: BALLOON ANGIOPLASTY OF SUPERIOR MESENTERIC ARTERY;  Surgeon: Broadus John, MD;  Location: [facility];  Service: Vascular;  Laterality: N/A;   ENDARTERECTOMY FEMORAL Left 08/15/2018   Procedure: ENDARTERECTOMY COMMON FEMORAL;  Surgeon: Waynetta Sandy, MD;  Location: [facility];  Service: Vascular;  Laterality: Left;   INSERTION OF ILIAC STENT Left 08/15/2018   Procedure: INSERTION OF ILIAC STENT RETROGRADE;  Surgeon: Waynetta Sandy, MD;  Location: [facility];  Service: Vascular;  Laterality: Left;   LOWER EXTREMITY ANGIOGRAPHY N/A 08/14/2018   Procedure: LOWER EXTREMITY ANGIOGRAPHY;  Surgeon: Waynetta Sandy, MD;  Location: [facility];  Service: Cardiovascular;  Laterality: N/A;   MESENTERIC ARTERY BYPASS Left 12/28/2021   Procedure: MESENTERIC ANGIOGRAPHY FROM LEFT BRACHIAL ARTERY;  Surgeon: Broadus John, MD;  Location: [facility];  Service: Vascular;  Laterality: Left;   No Known Allergies Current Outpatient Medications on File Prior to Visit  Medication Sig Dispense Refill   amLODipine (NORVASC) 10 MG tablet Take 1 tablet (10 mg total) by mouth daily. 90 tablet 1   hydrochlorothiazide (MICROZIDE) 12.5 MG capsule Take 1 capsule (12.5 mg total) by mouth daily. 90 capsule 1   rivaroxaban (XARELTO) 20 MG TABS tablet Take 1 tablet (20  mg total) by mouth daily with supper. 30 tablet 2   No current facility-administered medications on file prior to visit.   Social History   Socioeconomic History   Marital status: Single    Spouse name: Not on file   Number of children: Not on file   Years of education: Not on file   Highest education level: Not on file  Occupational History   Not on file  Tobacco Use   Smoking status: Every Day    Packs/day: 1.00    Years: 30.00    Total pack years: 30.00    Types: Cigarettes   Smokeless tobacco: Never  Substance and Sexual Activity   Alcohol use: Yes    Alcohol/week: 3.0 standard drinks of alcohol    Types: 3 Cans of beer per week   Drug use: No   Sexual activity: Not on file  Other Topics Concern   Not on file  Social History Narrative   Not on file   Social Determinants of Health   Financial Resource Strain: Not on file  Food Insecurity: Not on file  Transportation Needs: Unmet Transportation Needs (04/15/2022)   PRAPARE - Hydrologist (Medical): Yes    Lack of Transportation (Non-Medical): Yes  Physical Activity: Not on file  Stress: Not on file  Social Connections: Not on file  Intimate Partner Violence: Not on file   History reviewed. No pertinent family history.   OBJECTIVE:  Vitals:   05/25/22 1435 05/25/22 1459 05/25/22 1516  BP: (!) 155/99 (!) 165/98 (!) 140/84  Pulse: 71    Resp: 18    Temp: 98.4 F (36.9  C)    SpO2: 98%    Weight: 138 lb (62.6 kg)    Height: 5' 4" (1.626 m)      Physical Exam Vitals reviewed.  Constitutional:      Appearance: Normal appearance.     Comments: Thin frame  HENT:     Head: Normocephalic.     Right Ear: There is impacted cerumen.     Left Ear: Tympanic membrane and external ear normal.     Nose: Nose normal.  Eyes:     Extraocular Movements: Extraocular movements intact.     Pupils: Pupils are equal, round, and reactive to light.  Cardiovascular:     Rate and Rhythm: Normal  rate and regular rhythm.  Pulmonary:     Effort: Pulmonary effort is normal.     Breath sounds: Normal breath sounds.  Abdominal:     General: Abdomen is flat. Bowel sounds are normal.     Palpations: Abdomen is soft.  Musculoskeletal:        General: Normal range of motion.     Cervical back: Normal range of motion.  Skin:    General: Skin is warm and dry.  Neurological:     Mental Status: She is alert and oriented to person, place, and time.  Psychiatric:        Mood and Affect: Mood normal.        Behavior: Behavior normal.    ROS Comprehensive ROS Pertinent positive and negative noted in HPI   Last 3 Office BP readings: BP Readings from Last 3 Encounters:  05/25/22 (!) 140/84  03/30/22 (!) 162/104  03/16/22 132/80    BMET    Component Value Date/Time   NA 136 12/31/2021 0228   K 3.7 12/31/2021 0228   CL 106 12/31/2021 0228   CO2 23 12/31/2021 0228   GLUCOSE 105 (H) 12/31/2021 0228   BUN <5 (L) 12/31/2021 0228   CREATININE 0.43 (L) 12/31/2021 0228   CALCIUM 9.3 12/31/2021 0228   GFRNONAA >60 12/31/2021 0228   GFRAA >60 08/16/2018 0302    Renal function: CrCl cannot be calculated (Patient's most recent lab result is older than the maximum 21 days allowed.).  Clinical ASCVD:  The ASCVD Risk score (Arnett DK, et al., 2019) failed to calculate for the following reasons:   Cannot find a previous HDL lab   Cannot find a previous total cholesterol lab  ASCVD risk factors include- [place]   ASSESSMENT & PLAN:  Danielle Morton was seen today for hypertension and diabetes.  Diagnoses and all orders for this visit:  Need for immunization against influenza -     Flu Vaccine QUAD 87moIM (Fluarix, Fluzone & Alfiuria Quad PF)  Medication management -     CMP14+EGFR -     CBC with Differential  Lipid screening -     Lipid Panel  BMI 22.0-22.9, adult Eating high-protein and high-calorie foods can help you to gain weight  Essential hypertension -Counseled on lifestyle  modifications for blood pressure control including reduced dietary sodium, increased exercise, weight reduction and adequate sleep. Also, educated patient about the risk for cardiovascular events, stroke and heart attack. Also counseled patient about the importance of medication adherence. If you participate in smoking, it is important to stop using tobacco as this will increase the risks associated with uncontrolled blood pressure.   Goal BP:  For patients younger than 60: Goal BP < 130/80. For patients 60 and older: Goal BP < 140/90. For patients with diabetes: Goal BP <  130/80. Your most recent BP: 140/84  Minimize salt intake. Minimize alcohol intake      CMP14+EGFR  Encounter for screening mammogram for malignant neoplasm of breast Patient completed BCCP  Colon cancer screening Prescribed Cologuard  Other orders Per reviewing Dr. Gwenlyn Found notes continue current medication -     rivaroxaban (XARELTO) 20 MG TABS tablet; Take 1 tablet (20 mg total) by mouth daily with supper.    This note has been created with Surveyor, quantity. Any transcriptional errors are unintentional.   Kerin Perna, NP 05/25/2022, 3:17 PM

## 2022-05-26 ENCOUNTER — Other Ambulatory Visit: Payer: Self-pay

## 2022-05-26 LAB — CBC WITH DIFFERENTIAL/PLATELET
Basophils Absolute: 0 10*3/uL (ref 0.0–0.2)
Basos: 1 %
EOS (ABSOLUTE): 0 10*3/uL (ref 0.0–0.4)
Eos: 0 %
Hematocrit: 46.3 % (ref 34.0–46.6)
Hemoglobin: 15.3 g/dL (ref 11.1–15.9)
Immature Grans (Abs): 0 10*3/uL (ref 0.0–0.1)
Immature Granulocytes: 0 %
Lymphocytes Absolute: 2.7 10*3/uL (ref 0.7–3.1)
Lymphs: 36 %
MCH: 29.8 pg (ref 26.6–33.0)
MCHC: 33 g/dL (ref 31.5–35.7)
MCV: 90 fL (ref 79–97)
Monocytes Absolute: 0.6 10*3/uL (ref 0.1–0.9)
Monocytes: 8 %
Neutrophils Absolute: 4.1 10*3/uL (ref 1.4–7.0)
Neutrophils: 55 %
Platelets: 259 10*3/uL (ref 150–450)
RBC: 5.14 x10E6/uL (ref 3.77–5.28)
RDW: 14.2 % (ref 11.7–15.4)
WBC: 7.5 10*3/uL (ref 3.4–10.8)

## 2022-05-26 LAB — LIPID PANEL
Chol/HDL Ratio: 6.1 ratio — ABNORMAL HIGH (ref 0.0–4.4)
Cholesterol, Total: 267 mg/dL — ABNORMAL HIGH (ref 100–199)
HDL: 44 mg/dL (ref >39)
LDL Chol Calc (NIH): 204 mg/dL — ABNORMAL HIGH (ref 0–99)
Triglycerides: 105 mg/dL (ref 0–149)
VLDL Cholesterol Cal: 19 mg/dL (ref 5–40)

## 2022-05-26 LAB — CMP14+EGFR
ALT: 8 IU/L (ref 0–32)
AST: 13 IU/L (ref 0–40)
Albumin/Globulin Ratio: 1.4 (ref 1.2–2.2)
Albumin: 4.9 g/dL (ref 3.9–4.9)
Alkaline Phosphatase: 130 IU/L — ABNORMAL HIGH (ref 44–121)
BUN/Creatinine Ratio: 32 — ABNORMAL HIGH (ref 12–28)
BUN: 25 mg/dL (ref 8–27)
Bilirubin Total: 0.2 mg/dL (ref 0.0–1.2)
CO2: 26 mmol/L (ref 20–29)
Calcium: 10.3 mg/dL (ref 8.7–10.3)
Chloride: 96 mmol/L (ref 96–106)
Creatinine, Ser: 0.77 mg/dL (ref 0.57–1.00)
Globulin, Total: 3.6 g/dL (ref 1.5–4.5)
Glucose: 95 mg/dL (ref 70–99)
Potassium: 3 mmol/L — ABNORMAL LOW (ref 3.5–5.2)
Sodium: 139 mmol/L (ref 134–144)
Total Protein: 8.5 g/dL (ref 6.0–8.5)
eGFR: 86 mL/min/{1.73_m2} (ref >59)

## 2022-05-30 ENCOUNTER — Other Ambulatory Visit (INDEPENDENT_AMBULATORY_CARE_PROVIDER_SITE_OTHER): Payer: Self-pay | Admitting: Primary Care

## 2022-05-30 MED ORDER — ATORVASTATIN CALCIUM 40 MG PO TABS
40.0000 mg | ORAL_TABLET | Freq: Every day | ORAL | 3 refills | Status: DC
  Filled 2022-05-30 – 2022-06-15 (×2): qty 90, 90d supply, fill #0
  Filled 2022-10-22: qty 90, 90d supply, fill #1
  Filled 2023-02-25: qty 90, 90d supply, fill #2

## 2022-05-30 MED ORDER — POTASSIUM CHLORIDE CRYS ER 20 MEQ PO TBCR
20.0000 meq | EXTENDED_RELEASE_TABLET | Freq: Every day | ORAL | 0 refills | Status: DC
  Filled 2022-05-30 – 2022-06-15 (×2): qty 90, 90d supply, fill #0

## 2022-05-31 ENCOUNTER — Other Ambulatory Visit: Payer: Self-pay

## 2022-06-01 ENCOUNTER — Other Ambulatory Visit: Payer: Self-pay

## 2022-06-03 ENCOUNTER — Encounter (INDEPENDENT_AMBULATORY_CARE_PROVIDER_SITE_OTHER): Payer: Self-pay

## 2022-06-07 ENCOUNTER — Other Ambulatory Visit: Payer: Self-pay

## 2022-06-09 ENCOUNTER — Encounter: Payer: Self-pay | Admitting: Cardiovascular Disease

## 2022-06-09 ENCOUNTER — Ambulatory Visit: Payer: PRIVATE HEALTH INSURANCE | Attending: Cardiovascular Disease | Admitting: Cardiovascular Disease

## 2022-06-09 VITALS — BP 138/80 | HR 81 | Ht 64.0 in | Wt 145.0 lb

## 2022-06-09 DIAGNOSIS — I10 Essential (primary) hypertension: Secondary | ICD-10-CM

## 2022-06-09 DIAGNOSIS — E785 Hyperlipidemia, unspecified: Secondary | ICD-10-CM

## 2022-06-09 DIAGNOSIS — R0989 Other specified symptoms and signs involving the circulatory and respiratory systems: Secondary | ICD-10-CM

## 2022-06-09 NOTE — Patient Instructions (Signed)
Medication Instructions:  Your physician recommends that you continue on your current medications as directed. Please refer to the Current Medication list given to you today.  *If you need a refill on your cardiac medications before your next appointment, please call your pharmacy*   Lab Work: Your physician recommends that you return for lab work in: 3 months for FASTING lipid/liver profile  If you have labs (blood work) drawn today and your tests are completely normal, you will receive your results only by: [place] (if you have MyChart) OR A paper copy in the mail If you have any lab test that is abnormal or we need to change your treatment, we will call you to review the results.   Testing/Procedures: Your physician has requested that you have a lexiscan myoview. For further information please visit [email or web address]. Please follow instruction sheet, as given. This will take place at [place]  How to prepare for your Myocardial Perfusion Test: Do not eat or drink 3 hours prior to your test, except you may have water. Do not consume products containing caffeine (regular or decaffeinated) 12 hours prior to your test. (ex: coffee, chocolate, sodas, tea). Do bring a list of your current medications with you.  If not listed below, you may take your medications as normal. Do wear comfortable clothes (no dresses or overalls) and walking shoes, tennis shoes preferred (No heels or open toe shoes are allowed). Do NOT wear cologne, perfume, aftershave, or lotions (deodorant is allowed). The test will take approximately 3 to 4 hours to complete If these instructions are not followed, your test will have to be rescheduled.  Your physician has requested that you have a carotid duplex. This test is an ultrasound of the carotid arteries in your neck. It looks at blood flow through these arteries that supply the brain with blood. Allow one hour for this exam. There are no  restrictions or special instructions. To be done in July 2024. This procedure will be done at [place]    Follow-Up: At [facility], you and your health needs are our priority.  As part of our continuing mission to provide you with exceptional heart care, we have created designated Provider Care Teams.  These Care Teams include your primary Cardiologist (physician) and Advanced Practice Providers (APPs -  Physician Assistants and Nurse Practitioners) who all work together to provide you with the care you need, when you need it.  We recommend signing up for the patient portal called "MyChart".  Sign up information is provided on this After Visit Summary.  MyChart is used to connect with patients for Virtual Visits (Telemedicine).  Patients are able to view lab/test results, encounter notes, upcoming appointments, etc.  Non-urgent messages can be sent to your provider as well.   To learn more about what you can do with MyChart, go to [email or web address].    Your next appointment:   6 month(s)  The format for your next appointment:   In Person  Provider:   Quay Burow, MD   Other Instructions Heart-Healthy Eating Plan  Eating a healthy diet is important for the health of your heart. A heart-healthy eating plan includes: Eating less unhealthy fats. Eating more healthy fats. Eating less salt in your food. Salt is also called sodium. Making other changes in your diet. Cooking Avoid frying your food. Try to bake, boil, grill, or broil it instead. You can also reduce fat by: Removing the skin from poultry.  Removing all visible fats from meats. Steaming vegetables in water or broth. Meal planning  At meals, divide your plate into four equal parts: Fill one-half of your plate with vegetables and green salads. Fill one-fourth of your plate with whole grains. Fill one-fourth of your plate with lean protein foods. Eat 2-4 cups of vegetables per day. One  cup of vegetables is: 1 cup (91 g) broccoli or cauliflower florets. 2 medium carrots. 1 large bell pepper. 1 large sweet potato. 1 large tomato. 1 medium white potato. 2 cups (150 g) raw leafy greens. Eat 1-2 cups of fruit per day. One cup of fruit is: 1 small apple 1 large banana 1 cup (237 g) mixed fruit, 1 large orange,  cup (82 g) dried fruit, 1 cup (240 mL) 100% fruit juice. Eat more foods that have soluble fiber. These are apples, broccoli, carrots, beans, peas, and barley. Try to get 20-30 g of fiber per day. Eat 4-5 servings of nuts, legumes, and seeds per week: 1 serving of dried beans or legumes equals  cup (90 g) cooked. 1 serving of nuts is  oz (12 almonds, 24 pistachios, or 7 walnut halves). 1 serving of seeds equals  oz (8 g). General information Eat more home-cooked food. Eat less restaurant, buffet, and fast food. Limit or avoid alcohol. Limit foods that are high in starch and sugar. Avoid fried foods. Lose weight if you are overweight. Keep track of how much salt (sodium) you eat. This is important if you have high blood pressure. Ask your doctor to tell you more about this. Try to add vegetarian meals each week. Fats Choose healthy fats. These include olive oil and canola oil, flaxseeds, walnuts, almonds, and seeds. Eat more omega-3 fats. These include salmon, mackerel, sardines, tuna, flaxseed oil, and ground flaxseeds. Try to eat fish at least 2 times each week. Check food labels. Avoid foods with trans fats or high amounts of saturated fat. Limit saturated fats. These are often found in animal products, such as meats, butter, and cream. These are also found in plant foods, such as palm oil, palm kernel oil, and coconut oil. Avoid foods with partially hydrogenated oils in them. These have trans fats. Examples are stick margarine, some tub margarines, cookies, crackers, and other baked goods. What foods should I eat? Fruits All fresh, canned (in natural  juice), or frozen fruits. Vegetables Fresh or frozen vegetables (raw, steamed, roasted, or grilled). Green salads. Grains Most grains. Choose whole wheat and whole grains most of the time. Rice and pasta, including brown rice and pastas made with whole wheat. Meats and other proteins Lean, well-trimmed beef, veal, pork, and lamb. Chicken and [place] without skin. All fish and shellfish. Wild duck, rabbit, pheasant, and venison. Egg whites or low-cholesterol egg substitutes. Dried beans, peas, lentils, and tofu. Seeds and most nuts. Dairy Low-fat or nonfat cheeses, including ricotta and mozzarella. Skim or 1% milk that is liquid, powdered, or evaporated. Buttermilk that is made with low-fat milk. Nonfat or low-fat yogurt. Fats and oils Non-hydrogenated (trans-free) margarines. Vegetable oils, including soybean, sesame, sunflower, olive, peanut, safflower, corn, canola, and cottonseed. Salad dressings or mayonnaise made with a vegetable oil. Beverages Mineral water. Coffee and tea. Diet carbonated beverages. Sweets and desserts Sherbet, gelatin, and fruit ice. Small amounts of dark chocolate. Limit all sweets and desserts. Seasonings and condiments All seasonings and condiments. The items listed above may not be a complete list of foods and drinks you can eat. Contact a dietitian for more  options. What foods should I avoid? Fruits Canned fruit in heavy syrup. Fruit in cream or butter sauce. Fried fruit. Limit coconut. Vegetables Vegetables cooked in cheese, cream, or butter sauce. Fried vegetables. Grains Breads that are made with saturated or trans fats, oils, or whole milk. Croissants. Sweet rolls. Donuts. High-fat crackers, such as cheese crackers. Meats and other proteins Fatty meats, such as hot dogs, ribs, sausage, bacon, rib-eye roast or steak. High-fat deli meats, such as salami and bologna. Caviar. Domestic duck and goose. Organ meats, such as liver. Dairy Cream, sour cream, cream  cheese, and creamed cottage cheese. Whole-milk cheeses. Whole or 2% milk that is liquid, evaporated, or condensed. Whole buttermilk. Cream sauce or high-fat cheese sauce. Yogurt that is made from whole milk. Fats and oils Meat fat, or shortening. Cocoa butter, hydrogenated oils, palm oil, coconut oil, palm kernel oil. Solid fats and shortenings, including bacon fat, salt pork, lard, and butter. Nondairy cream substitutes. Salad dressings with cheese or sour cream. Beverages Regular sodas and juice drinks with added sugar. Sweets and desserts Frosting. Pudding. Cookies. Cakes. Pies. Milk chocolate or white chocolate. Buttered syrups. Full-fat ice cream or ice cream drinks. The items listed above may not be a complete list of foods and drinks to avoid. Contact a dietitian for more information. Summary Heart-healthy meal planning includes eating less unhealthy fats, eating more healthy fats, and making other changes in your diet. Eat a balanced diet. This includes fruits and vegetables, low-fat or nonfat dairy, lean protein, nuts and legumes, whole grains, and heart-healthy oils and fats. This information is not intended to replace advice given to you by your health care provider. Make sure you discuss any questions you have with your health care provider. Document Revised: 10/12/2021 Document Reviewed: 10/12/2021 Elsevier Patient Education  [place].

## 2022-06-09 NOTE — Progress Notes (Unsigned)
Ms. Tierney returns for follow-up of her diagnostic tests.  Her blood pressures under better control.  She was begun on atorvastatin 40 mg a day by Juluis Mire.  We will recheck a lipid liver profile in 3 months.  Her last lipid profile performed 05/25/2022 revealed total cholesterol 267, LDL of 204 and HDL of 44.  LDL goal is less than 70.  She did have carotid Dopplers which showed moderate right ICA stenosis.  This will be repeated on an annual basis.  She had a coronary calcium score performed 03/22/2022 which was 1179 with calcium distributed in the LAD primarily but also on the circumflex and RCA.  She is completely asymptomatic.  I am going to get a baseline Lexiscan Myoview to rule out physiologically significant disease.  I will see her back in 6 months for follow-up.  Lorretta Harp, M.D., [place], [facility], [place] [address]. [place], [place]  [address]  [phone] 06/09/2022 4:13 PM

## 2022-06-14 ENCOUNTER — Ambulatory Visit (INDEPENDENT_AMBULATORY_CARE_PROVIDER_SITE_OTHER): Payer: PRIVATE HEALTH INSURANCE | Admitting: Primary Care

## 2022-06-15 ENCOUNTER — Other Ambulatory Visit: Payer: Self-pay

## 2022-06-17 ENCOUNTER — Telehealth (HOSPITAL_COMMUNITY): Payer: Self-pay | Admitting: *Deleted

## 2022-06-17 NOTE — Telephone Encounter (Signed)
Close encounter 

## 2022-06-18 ENCOUNTER — Other Ambulatory Visit: Payer: Self-pay

## 2022-06-18 ENCOUNTER — Encounter (HOSPITAL_COMMUNITY): Payer: Self-pay | Admitting: Cardiovascular Disease

## 2022-06-18 ENCOUNTER — Ambulatory Visit (HOSPITAL_COMMUNITY)
Admission: RE | Admit: 2022-06-18 | Payer: PRIVATE HEALTH INSURANCE | Source: Ambulatory Visit | Attending: Cardiovascular Disease | Admitting: Cardiovascular Disease

## 2022-08-04 IMAGING — CT CT CTA ABD/PEL W/CM AND/OR W/O CM
2 of 10 series · 11 of 46 positions shown, 15 images · IV contrast (agent unspecified)
Comparison: CT angiography 08/14/2018

CLINICAL DATA: Lower abdominal pain and vomiting

EXAM:
CTA ABDOMEN AND PELVIS WITHOUT AND WITH CONTRAST
TECHNIQUE: Multidetector CT imaging of the abdomen and pelvis was performed
using the standard protocol during bolus administration of
intravenous contrast. Multiplanar reconstructed images and MIPs were
obtained and reviewed to evaluate the vascular anatomy.

[Series 11: axial venous · axial · portal-venous · 0.72mm/px · z∈[+975,+1309]mm · 9 of 205 slices shown, 13 images]
[im 19/205  soft-tissue]
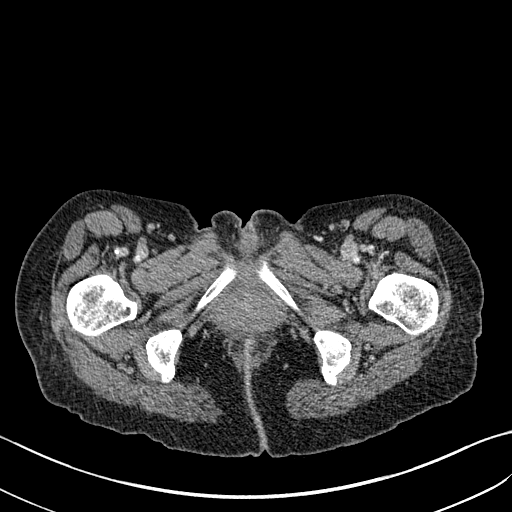
[im 19/205  bone]
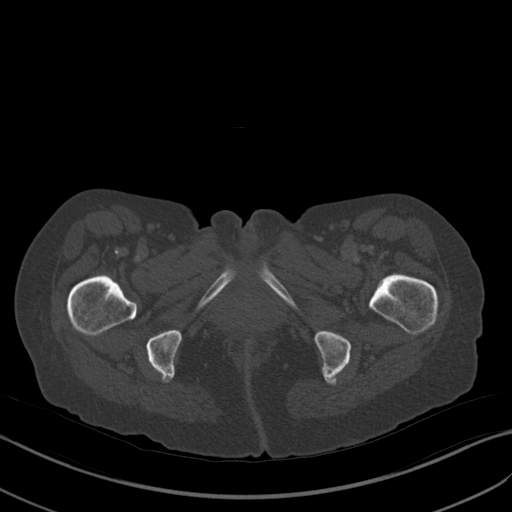
[im 38/205  soft-tissue]
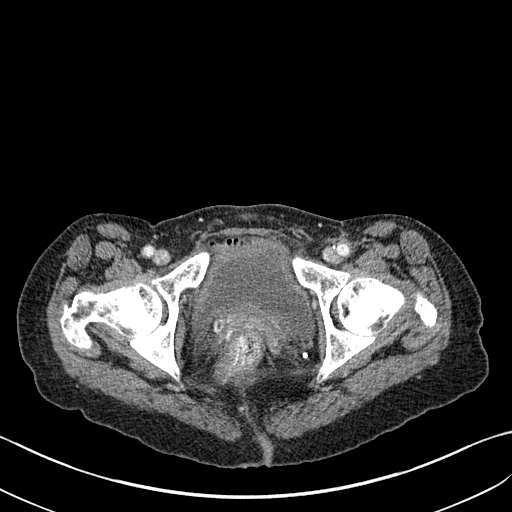
[im 75/205  soft-tissue]
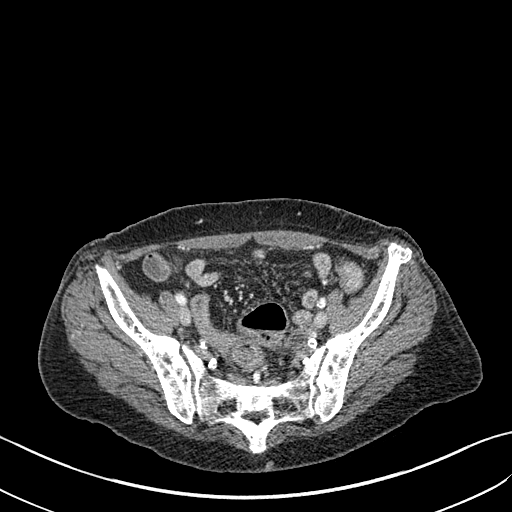
[im 93/205  soft-tissue]
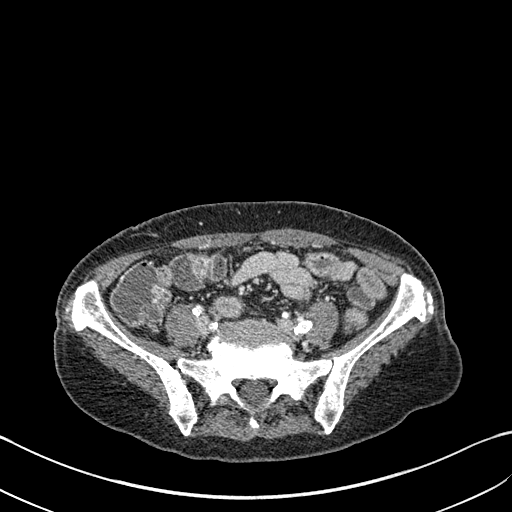
[im 112/205  soft-tissue]
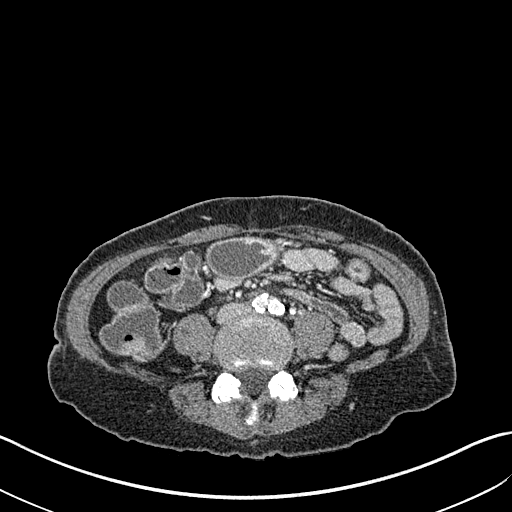
[im 130/205  soft-tissue]
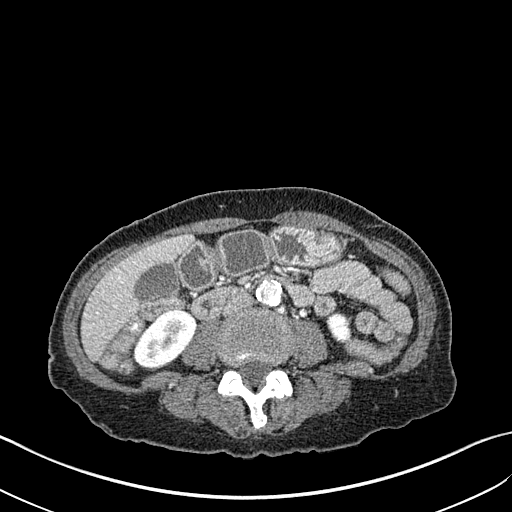
[im 130/205  lung]
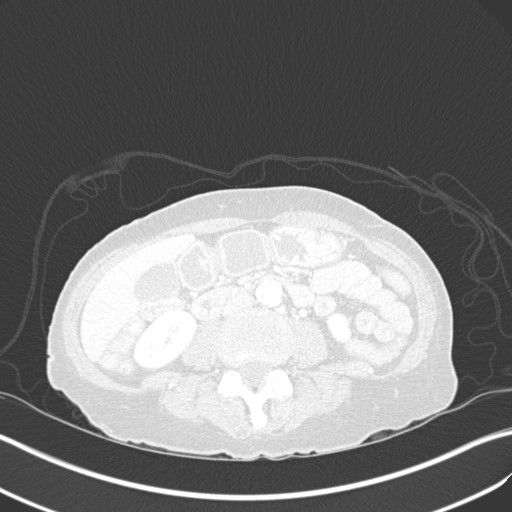
[im 149/205  lung]
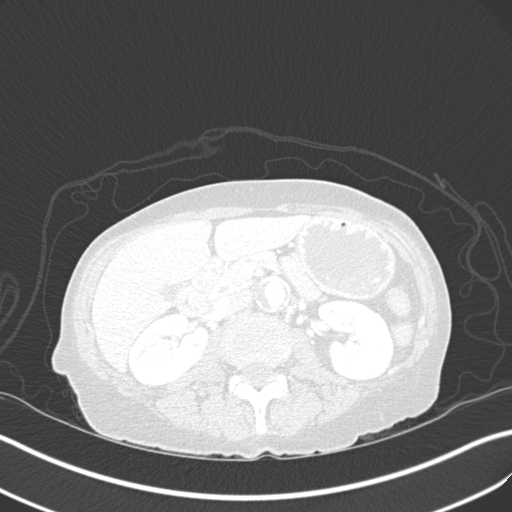
[im 167/205  soft-tissue]
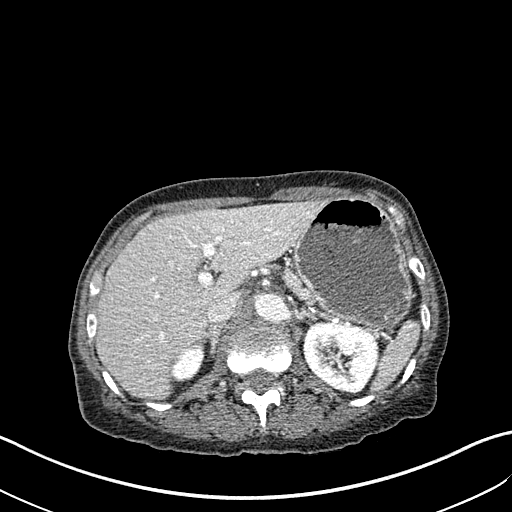
[im 167/205  lung]
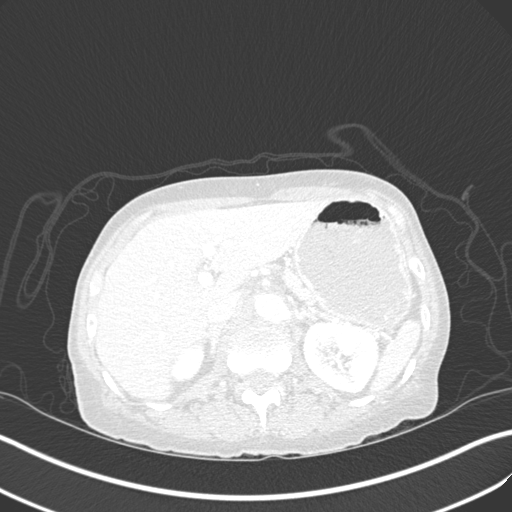
[im 186/205  soft-tissue]
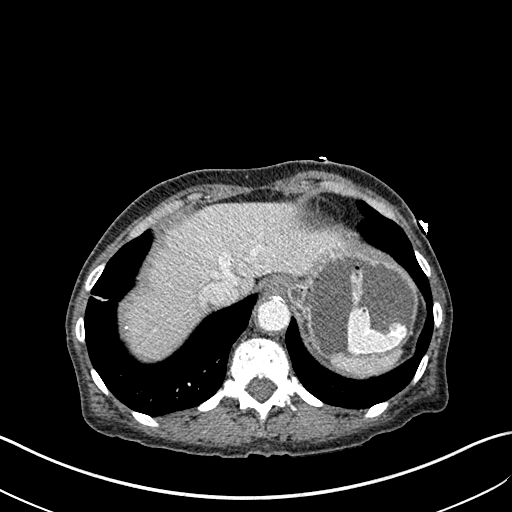
[im 186/205  lung]
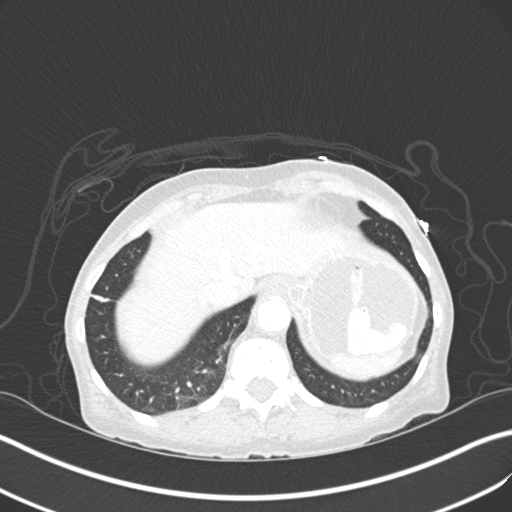

[Series 13: coronal mpr · coronal · 0.64mm/px · 2 of 114 slices shown]
[im 38/114  soft-tissue]
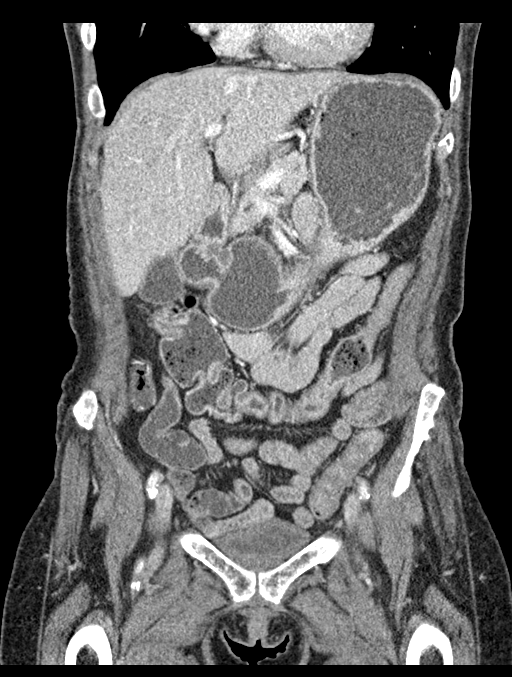
[im 76/114  soft-tissue]
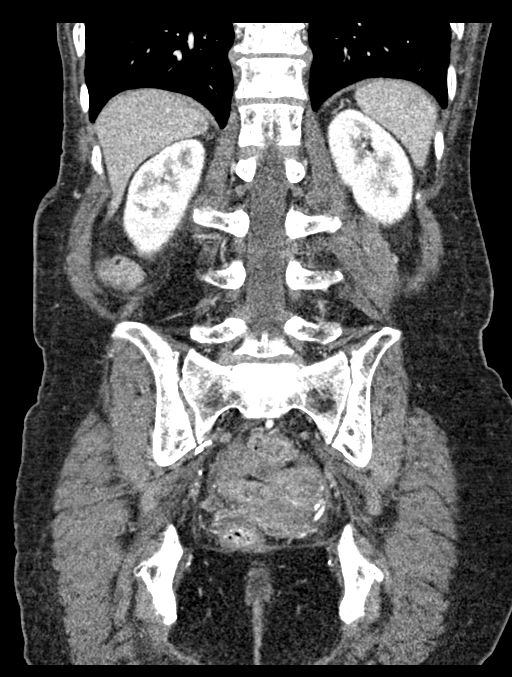

[11 of 46 positions shown; findings below may reference images not displayed]

RADIATION DOSE REDUCTION: This exam was performed according to the
departmental dose-optimization program which includes automated
exposure control, adjustment of the mA and/or kV according to
patient size and/or use of iterative reconstruction technique.

CONTRAST:  80mL OMNIPAQUE IOHEXOL 350 MG/ML SOLN
FINDINGS: VASCULAR

Aorta: Moderate severe aortic atherosclerosis. No definitive aortic
aneurysm. Moderate infrarenal circumferential mural thrombus results
in slightly less than 50% luminal stenosis. No dissection is seen.

Celiac: Calcification at the origin of the celiac with approximate
50% stenosis. Luminal irregularity of the distal celiac artery at
the bifurcation of the common hepatic artery. The splenic artery
appears occluded.

SMA: Patent at the origin. Moderate luminal stenosis of the superior
mesenteric artery about 2 cm distal to the origin with 6 mm
segmental occlusion at the bifurcation of mesenteric branch vessels.
Flow enhancement is seen distal to this.

Renals: Single right and single left renal arteries. Moderate severe
focal stenosis of the proximal right renal artery less than a cm
distal to the origin. Left renal artery appears patent and without
aneurysm or dissection.

IMA: Chronic occlusion of the IMA. Diminutive appearing distal
branch vessels with suboptimal flow enhancement.

Inflow: Moderate severe aortic atherosclerosis. Interim stenting of
the left common iliac artery which appears patent. Chronically
occluded left internal iliac artery. Moderate diffuse disease of
left external iliac artery without occlusion. Moderate severe
diffuse disease of the right external iliac artery with distal
occlusion. The right external iliac artery shows moderate disease
with mild to moderate focal stenosis of the right mid external iliac
artery.

Proximal Outflow: Moderate disease of the right common femoral
artery with slightly greater than 50% stenosis of the right common
femoral artery. Right superficial femoral artery occludes slightly
distal to its origin. Moderate severe stenosis of the deep femoral
artery several cm distal to the origin. Mild aneurysmal dilatation
of the left common femoral artery. The superficial femoral artery
occludes just distal to its origin.

Veins: No obvious venous abnormality within the limitations of this
arterial phase study.

Review of the MIP images confirms the above findings.

NON-VASCULAR

Lower chest: Lung bases demonstrate no acute consolidation or
effusion. Scarring or atelectasis at the right middle lobe.

Hepatobiliary: No focal liver abnormality is seen. No gallstones,
gallbladder wall thickening, or biliary dilatation.

Pancreas: Unremarkable. No pancreatic ductal dilatation or
surrounding inflammatory changes.

Spleen: Normal in size without focal abnormality.

Adrenals/Urinary Tract: Adrenal glands are unremarkable. Kidneys are
normal, without renal calculi, focal lesion, or hydronephrosis.
Bladder is unremarkable.

Stomach/Bowel: The stomach contains fluid and hyperdense material.
There is no dilated small bowel. Fluid within the colon. No definite
acute bowel wall thickening or inflammation. Negative for intramural
air.

Lymphatic: No suspicious lymph nodes.

Reproductive: Calcified uterine fibroid.  No adnexal mass

Other: Negative for pelvic effusion or free air

Musculoskeletal: No acute osseous abnormality.
IMPRESSION: VASCULAR

1. Advanced atherosclerotic vascular disease of the aorta. Moderate
stenosis of the superior mesenteric artery with short segment of
occlusion of the distal SMA just proximal to its bifurcation into
mesenteric branch vessels. Flow enhancement is seen distal to the
area of occlusion. There is no evidence for bowel wall thickening,
intramural air or mesenteric vascular congestion.
2. Interval occlusion of the splenic artery. There is no evidence
for splenic artery infarct. The spleen is somewhat small in
appearance.
3. Interval placement of left common iliac artery stent with
patency. Moderate severe disease of the internal iliac vessels which
appear occluded bilaterally. Chronically occluded inferior
mesenteric artery with diminutive distal flow enhancement. Suspect
moderate severe focal stenosis of the proximal right renal artery.
4. Occluded appearance of the visualized portions of the bilateral
superficial femoral arteries.

NON-VASCULAR

1. Negative for bowel obstruction or bowel wall thickening.
2. Calcified uterine fibroids

## 2022-08-24 ENCOUNTER — Ambulatory Visit (INDEPENDENT_AMBULATORY_CARE_PROVIDER_SITE_OTHER): Payer: PRIVATE HEALTH INSURANCE | Admitting: Primary Care

## 2022-09-29 ENCOUNTER — Other Ambulatory Visit: Payer: Self-pay

## 2022-10-15 ENCOUNTER — Other Ambulatory Visit: Payer: Self-pay

## 2022-10-22 ENCOUNTER — Other Ambulatory Visit (INDEPENDENT_AMBULATORY_CARE_PROVIDER_SITE_OTHER): Payer: Self-pay | Admitting: Primary Care

## 2022-10-22 ENCOUNTER — Other Ambulatory Visit: Payer: Self-pay

## 2022-10-22 MED ORDER — POTASSIUM CHLORIDE CRYS ER 20 MEQ PO TBCR
20.0000 meq | EXTENDED_RELEASE_TABLET | Freq: Every day | ORAL | 1 refills | Status: DC
  Filled 2022-10-22: qty 90, 90d supply, fill #0
  Filled 2023-02-25: qty 90, 90d supply, fill #1

## 2022-10-22 NOTE — Telephone Encounter (Signed)
Requested Prescriptions  Pending Prescriptions Disp Refills   potassium chloride SA (KLOR-CON M) 20 MEQ tablet 90 tablet 1    Sig: Take 1 tablet (20 mEq total) by mouth daily.     Endocrinology:  Minerals - Potassium Supplementation Failed - 10/22/2022  4:11 PM      Failed - K in normal range and within 360 days    Potassium  Date Value Ref Range Status  05/25/2022 3.0 (L) 3.5 - 5.2 mmol/L Final         Passed - Cr in normal range and within 360 days    Creatinine, Ser  Date Value Ref Range Status  05/25/2022 0.77 0.57 - 1.00 mg/dL Final         Passed - Valid encounter within last 12 months    Recent Outpatient Visits           5 months ago Need for immunization against influenza   [place], [place] P, NP   6 months ago Primary hypertension   [place], [place] L, RPH-CPP   8 months ago Screening for diabetes mellitus   [place] [facility] Kerin Perna, NP       Future Appointments             In 1 month Gwenlyn Found, Pearletha [place], MD [place] [facility]

## 2022-11-04 ENCOUNTER — Other Ambulatory Visit: Payer: Self-pay

## 2022-11-15 ENCOUNTER — Other Ambulatory Visit: Payer: Self-pay

## 2022-11-16 ENCOUNTER — Other Ambulatory Visit: Payer: Self-pay

## 2022-11-17 ENCOUNTER — Other Ambulatory Visit: Payer: Self-pay

## 2022-12-08 ENCOUNTER — Ambulatory Visit: Payer: PRIVATE HEALTH INSURANCE | Attending: Cardiovascular Disease | Admitting: Cardiovascular Disease

## 2023-01-11 ENCOUNTER — Other Ambulatory Visit (INDEPENDENT_AMBULATORY_CARE_PROVIDER_SITE_OTHER): Payer: Self-pay | Admitting: Primary Care

## 2023-01-11 ENCOUNTER — Other Ambulatory Visit: Payer: Self-pay

## 2023-01-11 DIAGNOSIS — I10 Essential (primary) hypertension: Secondary | ICD-10-CM

## 2023-01-11 MED ORDER — HYDROCHLOROTHIAZIDE 12.5 MG PO CAPS
12.5000 mg | ORAL_CAPSULE | Freq: Every day | ORAL | 0 refills | Status: DC
  Filled 2023-01-11 – 2023-01-28 (×2): qty 30, 30d supply, fill #0

## 2023-01-11 MED ORDER — AMLODIPINE BESYLATE 10 MG PO TABS
10.0000 mg | ORAL_TABLET | Freq: Every day | ORAL | 0 refills | Status: DC
  Filled 2023-01-11 – 2023-01-28 (×2): qty 30, 30d supply, fill #0

## 2023-01-11 NOTE — Telephone Encounter (Signed)
Call pt to schedule appt. - Call cannot be completed at this time.

## 2023-01-11 NOTE — Telephone Encounter (Signed)
Courtesy refill. Patient will need an office visit for further refills. Requested Prescriptions  Pending Prescriptions Disp Refills   hydrochlorothiazide (MICROZIDE) 12.5 MG capsule 30 capsule 0    Sig: Take 1 capsule (12.5 mg total) by mouth daily.     Cardiovascular: Diuretics - Thiazide Failed - 01/11/2023  5:03 PM      Failed - Cr in normal range and within 180 days    Creatinine, Ser  Date Value Ref Range Status  05/25/2022 0.77 0.57 - 1.00 mg/dL Final         Failed - K in normal range and within 180 days    Potassium  Date Value Ref Range Status  05/25/2022 3.0 (L) 3.5 - 5.2 mmol/L Final         Failed - Na in normal range and within 180 days    Sodium  Date Value Ref Range Status  05/25/2022 139 134 - 144 mmol/L Final         Failed - Valid encounter within last 6 months    Recent Outpatient Visits           7 months ago Need for immunization against influenza   [place] [facility] Grayce Sessions, NP   9 months ago Primary hypertension   [place] [facility] [place], Boyds L, RPH-CPP   10 months ago Screening for diabetes mellitus   [place] [facility] Grayce Sessions, NP              Passed - Last BP in normal range    BP Readings from Last 1 Encounters:  06/09/22 138/80          amLODipine (NORVASC) 10 MG tablet 30 tablet 0    Sig: Take 1 tablet (10 mg total) by mouth daily.     Cardiovascular: Calcium Channel Blockers 2 Failed - 01/11/2023  5:03 PM      Failed - Valid encounter within last 6 months    Recent Outpatient Visits           7 months ago Need for immunization against influenza   [place] [facility] Grayce Sessions, NP   9 months ago Primary hypertension   [place] [facility] [place], Jeannett Senior L, RPH-CPP   10 months ago Screening for diabetes mellitus   [place] [facility] Grayce Sessions, NP              Passed - Last BP in normal range    BP Readings from Last 1 Encounters:  06/09/22 138/80         Passed - Last Heart Rate in normal range    Pulse Readings from Last 1 Encounters:  06/09/22 81

## 2023-01-17 ENCOUNTER — Other Ambulatory Visit (HOSPITAL_COMMUNITY): Payer: Self-pay

## 2023-01-28 ENCOUNTER — Other Ambulatory Visit: Payer: Self-pay

## 2023-02-25 ENCOUNTER — Other Ambulatory Visit: Payer: Self-pay

## 2023-02-25 ENCOUNTER — Other Ambulatory Visit (INDEPENDENT_AMBULATORY_CARE_PROVIDER_SITE_OTHER): Payer: Self-pay | Admitting: Primary Care

## 2023-02-25 DIAGNOSIS — I10 Essential (primary) hypertension: Secondary | ICD-10-CM

## 2023-02-25 NOTE — Telephone Encounter (Signed)
Requested medication (s) are due for refill today: yes  Requested medication (s) are on the active medication list: yes  Last refill:  01/11/23  Future visit scheduled: no  Notes to clinic:  Unable to refill per protocol, courtesy refill already given, routing for provider approval.      Requested Prescriptions  Pending Prescriptions Disp Refills   hydrochlorothiazide (MICROZIDE) 12.5 MG capsule 30 capsule 0    Sig: Take 1 capsule (12.5 mg total) by mouth daily.     Cardiovascular: Diuretics - Thiazide Failed - 02/25/2023  8:37 AM      Failed - Cr in normal range and within 180 days    Creatinine, Ser  Date Value Ref Range Status  05/25/2022 0.77 0.57 - 1.00 mg/dL Final         Failed - K in normal range and within 180 days    Potassium  Date Value Ref Range Status  05/25/2022 3.0 (L) 3.5 - 5.2 mmol/L Final         Failed - Na in normal range and within 180 days    Sodium  Date Value Ref Range Status  05/25/2022 139 134 - 144 mmol/L Final         Failed - Valid encounter within last 6 months    Recent Outpatient Visits           9 months ago Need for immunization against influenza   [place] [facility] Grayce Sessions, NP   11 months ago Primary hypertension   [place] [facility] [place], [place] L, RPH-CPP   1 year ago Screening for diabetes mellitus   [place] [facility] Grayce Sessions, NP              Passed - Last BP in normal range    BP Readings from Last 1 Encounters:  06/09/22 138/80          amLODipine (NORVASC) 10 MG tablet 30 tablet 0    Sig: Take 1 tablet (10 mg total) by mouth daily.     Cardiovascular: Calcium Channel Blockers 2 Failed - 02/25/2023  8:37 AM      Failed - Valid encounter within last 6 months    Recent Outpatient Visits           9 months ago Need for immunization against influenza   [place] [facility] Grayce Sessions, NP    11 months ago Primary hypertension   [place] [facility] [place], Cornelius Moras, RPH-CPP   1 year ago Screening for diabetes mellitus   [place] [facility] Grayce Sessions, NP              Passed - Last BP in normal range    BP Readings from Last 1 Encounters:  06/09/22 138/80         Passed - Last Heart Rate in normal range    Pulse Readings from Last 1 Encounters:  06/09/22 81

## 2023-02-28 ENCOUNTER — Other Ambulatory Visit: Payer: Self-pay

## 2023-03-01 ENCOUNTER — Other Ambulatory Visit: Payer: Self-pay

## 2023-03-08 ENCOUNTER — Other Ambulatory Visit: Payer: Self-pay

## 2023-03-08 ENCOUNTER — Ambulatory Visit (INDEPENDENT_AMBULATORY_CARE_PROVIDER_SITE_OTHER): Payer: 59 | Admitting: Primary Care

## 2023-03-08 ENCOUNTER — Encounter (INDEPENDENT_AMBULATORY_CARE_PROVIDER_SITE_OTHER): Payer: Self-pay | Admitting: Primary Care

## 2023-03-08 VITALS — BP 142/90 | HR 70 | Resp 16 | Wt 132.8 lb

## 2023-03-08 DIAGNOSIS — F4323 Adjustment disorder with mixed anxiety and depressed mood: Secondary | ICD-10-CM | POA: Diagnosis not present

## 2023-03-08 DIAGNOSIS — I10 Essential (primary) hypertension: Secondary | ICD-10-CM

## 2023-03-08 MED ORDER — HYDROCHLOROTHIAZIDE 25 MG PO TABS
25.0000 mg | ORAL_TABLET | Freq: Every day | ORAL | 1 refills | Status: DC
Start: 2023-03-08 — End: 2023-06-06
  Filled 2023-03-08: qty 90, 90d supply, fill #0

## 2023-03-08 MED ORDER — AMLODIPINE BESYLATE 10 MG PO TABS
10.0000 mg | ORAL_TABLET | Freq: Every day | ORAL | 1 refills | Status: DC
Start: 2023-03-08 — End: 2023-06-06
  Filled 2023-03-08: qty 90, 90d supply, fill #0

## 2023-03-08 MED ORDER — ESCITALOPRAM OXALATE 5 MG PO TABS
5.0000 mg | ORAL_TABLET | Freq: Every day | ORAL | 1 refills | Status: DC
Start: 2023-03-08 — End: 2024-01-12
  Filled 2023-03-08: qty 30, 30d supply, fill #0
  Filled 2023-10-24 (×2): qty 30, 30d supply, fill #1

## 2023-03-08 NOTE — Progress Notes (Signed)
[facility]       ,     Subjective:   [name] is an 67 y.o. female who presents for evaluation and treatment of depressive and anxiety symptoms. Sudden onset she has been taking care of her sister and cooking for her and she has not been eating. Patient call EMS 2 weeks ago since than she has R above knee arthoplasty. Discharging from hospital to rehab.  Onset approximately 2 weeks ago, gradually worsening since that time.  Current symptoms include depressed mood, fatigue, anxiety,.  Current treatment for depression:Medication Sleep problems: Mild   Early awakening:Mild   Energy: Fair Motivation: Poor Concentration: Fair Rumination/worrying: Absent Memory: Good Tearfulness: Marked  Anxiety: Moderate  Panic: Absent  Overall Mood: Minimally worse  Hopelessness: Mild Suicidal ideation: Absent  Other/Psychosocial Stressors: daughter  Family history positive for depression in the patient's unknown.  Previous treatment modalities employed include None.  Past episodes of depression:no Organic causes of depression present: None.  Review of Systems Pertinent items noted in HPI and remainder of comprehensive ROS otherwise negative.   Objective:   Mental Status Examination:wnl   General: No apparent distress. Eyes: Extraocular eye movements intact, pupils equal and round. Neck: Supple, trachea midline. Thyroid: No enlargement, mobile without fixation, no tenderness. Cardiovascular: Regular rhythm and rate, no murmur, normal radial pulses. Respiratory: Normal respiratory effort, clear to auscultation. Gastrointestinal: Normal pitch active bowel sounds, nontender abdomen without distention or appreciable hepatomegaly. Musculoskeletal: Normal muscle tone, no tenderness on palpation of tibia, no excessive thoracic kyphosis. Skin: Appropriate warmth, no visible rash. Mental status: Alert, conversant, speech clear, thought logical, appropriate mood and  affect, no hallucinations or delusions evident. Hematologic/lymphatic: No cervical adenopathy, no visible ecchymoses.  Assessment:    Experiencing the following symptoms of depression most of the day nearly every day for more than two consecutive weeks:   Depressive Disorder and anxiety   Suicide Risk Assessment:  Suicidal intent: no Suicidal plan: no Access to means for suicide: no Lethality of means for suicide: no Prior suicide attempts: no Recent exposure to suicide:no    Plan:  Diagnoses and all orders for this visit:  Adjustment disorder with mixed anxiety and depressed mood See HPI  -     escitalopram (LEXAPRO) 5 MG tablet; Take 1 tablet (5 mg total) by mouth daily.  Essential hypertension BP goal - < 130/80- 140/90  Explained that having normal blood pressure is the goal and medications are helping to get to goal and maintain normal blood pressure. DIET: Limit salt intake, read nutrition labels to check salt content, limit fried and high fatty foods  Avoid using multisymptom OTC cold preparations that generally contain sudafed which can rise BP. Consult with pharmacist on best cold relief products to use for persons with HTN EXERCISE Discussed incorporating exercise such as walking - 30 minutes most days of the week and can do in 10 minute intervals    -     amLODipine (NORVASC) 10 MG tablet; Take 1 tablet (10 mg total) by mouth daily. -     hydrochlorothiazide (HYDRODIURIL) 25 MG tablet; Take 1 tablet (25 mg total) by mouth daily.     Reviewed concept of depression as biochemical imbalance of neurotransmitters and rationale for treatment. Instructed patient to contact office or on-call physician promptly should condition worsen or any new symptoms appear and provided on-call telephone numbers.    This note has been created with Education officer, environmental. Any transcriptional  errors are unintentional.  4:48 PM Grayce Sessions,  NP

## 2023-03-08 NOTE — Patient Instructions (Addendum)
Escitalopram Tablets What is this medication? ESCITALOPRAM (es sye TAL oh pram) treats depression and anxiety. It increases the amount of serotonin in the brain, a hormone that helps regulate mood. It belongs to a group of medications called SSRIs. This medicine may be used for other purposes; ask your health care provider or pharmacist if you have questions. COMMON BRAND NAME(S): DepoDur, Lexapro, PramLyte What should I tell my care team before I take this medication? They need to know if you have any of these conditions: Bipolar disorder or a family history of bipolar disorder Diabetes Glaucoma Heart disease Kidney disease Liver disease Receiving electroconvulsive therapy Seizures Suicidal thoughts, plans, or attempt by you or a family member An unusual or allergic reaction to escitalopram, other medications, foods, dyes, or preservatives Pregnant or trying to become pregnant Breastfeeding How should I use this medication? Take this medication by mouth with a glass of water. Take it as directed on the prescription label at the same time every day. You can take it with or without food. If it upsets your stomach, take it with food. Do not take it more often than directed. Do not stop taking this medication suddenly except upon the advice of your care team. Stopping this medication too quickly may cause serious side effects or your condition may worsen. A special MedGuide will be given to you by the pharmacist with each prescription and refill. Be sure to read this information carefully each time. Talk to your care team about the use of this medication in children. Special care may be needed. Overdosage: If you think you have taken too much of this medicine contact a poison control center or emergency room at once. NOTE: This medicine is only for you. Do not share this medicine with others. What if I miss a dose? If you miss a dose, take it as soon as you can. If it is almost time for your  next dose, take only that dose. Do not take double or extra doses. What may interact with this medication? Do not take this medication with any of the following: Certain medications for fungal infections, such as fluconazole, itraconazole, ketoconazole, posaconazole, voriconazole Cisapride Citalopram Dronedarone Linezolid MAOIs, such as Carbex, Eldepryl, Marplan, Nardil, and Parnate Methylene blue (injected into a vein) Pimozide Thioridazine This medication may also interact with the following: Alcohol Amphetamines Aspirin and aspirin-like medications Carbamazepine Certain medications for mental health conditions Certain medications for migraine headache, such as almotriptan, eletriptan, frovatriptan, naratriptan, rizatriptan, sumatriptan, zolmitriptan Certain medications for sleep Certain medications that treat or prevent blood clots, such as warfarin, enoxaparin, dalteparin Cimetidine Diuretics Dofetilide Fentanyl Furazolidone Isoniazid Lithium Metoprolol NSAIDs, medications for pain and inflammation, such as ibuprofen or naproxen Other medications that cause heart rhythm changes Procarbazine Rasagiline Supplements, such as St. John's wort, kava kava, valerian Tramadol Tryptophan Ziprasidone This list may not describe all possible interactions. Give your health care provider a list of all the medicines, herbs, non-prescription drugs, or dietary supplements you use. Also tell them if you smoke, drink alcohol, or use illegal drugs. Some items may interact with your medicine. What should I watch for while using this medication? Tell your care team if your symptoms do not get better or if they get worse. Visit your care team for regular checks on your progress. Because it may take several weeks to see the full effects of this medication, it is important to continue your treatment as prescribed by your care team. Watch for new or worsening thoughts of suicide  or depression. This  includes sudden changes in mood, behaviors, or thoughts. These changes can happen at any time but are more common in the beginning of treatment or after a change in dose. Call your care team right away if you experience these thoughts or worsening depression. This medication may cause mood and behavior changes, such as anxiety, nervousness, irritability, hostility, restlessness, excitability, hyperactivity, or trouble sleeping. These changes can happen at any time but are more common in the beginning of treatment or after a change in dose. Call your care team right away if you notice any of these symptoms. This medication may affect your coordination, reaction time, or judgment. Do not drive or operate machinery until you know how this medication affects you. Sit up or stand slowly to reduce the risk of dizzy or fainting spells. Drinking alcohol with this medication can increase the risk of these side effects. Your mouth may get dry. Chewing sugarless gum or sucking hard candy and drinking plenty of water may help. Contact your care team if the problem does not go away or is severe. What side effects may I notice from receiving this medication? Side effects that you should report to your care team as soon as possible: Allergic reactions--skin rash, itching, hives, swelling of the face, lips, tongue, or throat Bleeding--bloody or black, tar-like stools, red or dark brown urine, vomiting blood or brown material that looks like coffee grounds, small, red or purple spots on skin, unusual bleeding or bruising Heart rhythm changes--fast or irregular heartbeat, dizziness, feeling faint or lightheaded, chest pain, trouble breathing Low sodium level--muscle weakness, fatigue, dizziness, headache, confusion Serotonin syndrome--irritability, confusion, fast or irregular heartbeat, muscle stiffness, twitching muscles, sweating, high fever, seizure, chills, vomiting, diarrhea Sudden eye pain or change in vision such  as blurry vision, seeing halos around lights, vision loss Thoughts of suicide or self-harm, worsening mood, feelings of depression Side effects that usually do not require medical attention (report to your care team if they continue or are bothersome): Change in sex drive or performance Diarrhea Excessive sweating Nausea Tremors or shaking Upset stomach This list may not describe all possible side effects. Call your doctor for medical advice about side effects. You may report side effects to FDA at 1-800-FDA-1088. Where should I keep my medication? Keep out of reach of children and pets. Store at room temperature between 15 and 30 degrees C (59 and 86 degrees F). Throw away any unused medication after the expiration date. NOTE: This sheet is a summary. It may not cover all possible information. If you have questions about this medicine, talk to your doctor, pharmacist, or health care provider.  2024 Elsevier/Gold Standard (2022-06-14 00:00:00) Dr. Allyson Sabal f/u [phone] refilled amlodipine 10mg  and increase hydrochlorothiazide 25mg  take both in AM. Note she has been carry for her twin sister and not taking care of herself and missed MYOCARDIAL PERFUSION - has bilateral carotid bruits.

## 2023-03-10 ENCOUNTER — Other Ambulatory Visit: Payer: Self-pay

## 2023-03-11 ENCOUNTER — Other Ambulatory Visit: Payer: Self-pay

## 2023-04-11 ENCOUNTER — Telehealth: Payer: Self-pay | Admitting: Primary Care

## 2023-04-11 NOTE — Telephone Encounter (Signed)
Copied from CRM [id number]. Topic: General - Other >> Apr 11, 2023  2:50 PM Turkey B wrote: Reason for CRM: Mandisha from [facility] called in requesting cane for pt.

## 2023-04-11 NOTE — Telephone Encounter (Signed)
PCP can address when she is back in the office

## 2023-04-13 NOTE — Telephone Encounter (Signed)
Spoke with patient . Verified name & DOB   Advised patient that she has appointment with PCP in August. Advised her that she should speak with her at that time. Advised that if she wanted to discuss getting the cane earlier he PCP will be back in the office next week.

## 2023-05-05 ENCOUNTER — Ambulatory Visit (INDEPENDENT_AMBULATORY_CARE_PROVIDER_SITE_OTHER): Payer: PRIVATE HEALTH INSURANCE | Admitting: Primary Care

## 2023-05-13 ENCOUNTER — Telehealth: Payer: Self-pay

## 2023-05-13 NOTE — Progress Notes (Signed)
Patient attempted to be outreached by Mack Guise on 05/13/23 to discuss hypertension. Left voicemail for patient to return our call at their convenience at [phone].  Mack Guise, Student-PharmD

## 2023-05-17 ENCOUNTER — Ambulatory Visit (INDEPENDENT_AMBULATORY_CARE_PROVIDER_SITE_OTHER): Payer: PRIVATE HEALTH INSURANCE | Admitting: Primary Care

## 2023-05-30 ENCOUNTER — Ambulatory Visit (INDEPENDENT_AMBULATORY_CARE_PROVIDER_SITE_OTHER): Payer: PRIVATE HEALTH INSURANCE | Admitting: Primary Care

## 2023-05-31 ENCOUNTER — Other Ambulatory Visit (HOSPITAL_COMMUNITY): Payer: Self-pay

## 2023-05-31 ENCOUNTER — Other Ambulatory Visit (INDEPENDENT_AMBULATORY_CARE_PROVIDER_SITE_OTHER): Payer: Self-pay | Admitting: Primary Care

## 2023-06-01 ENCOUNTER — Other Ambulatory Visit: Payer: Self-pay

## 2023-06-01 MED ORDER — ATORVASTATIN CALCIUM 40 MG PO TABS
40.0000 mg | ORAL_TABLET | Freq: Every day | ORAL | 0 refills | Status: DC
  Filled 2023-06-01: qty 90, 90d supply, fill #0

## 2023-06-01 NOTE — Telephone Encounter (Signed)
Requested Prescriptions  Pending Prescriptions Disp Refills   atorvastatin (LIPITOR) 40 MG tablet 90 tablet 0    Sig: Take 1 tablet (40 mg total) by mouth daily.     Cardiovascular:  Antilipid - Statins Failed - 05/31/2023  9:12 AM      Failed - Lipid Panel in normal range within the last 12 months    Cholesterol, Total  Date Value Ref Range Status  05/25/2022 267 (H) 100 - 199 mg/dL Final   LDL Chol Calc (NIH)  Date Value Ref Range Status  05/25/2022 204 (H) 0 - 99 mg/dL Final   HDL  Date Value Ref Range Status  05/25/2022 44 >39 mg/dL Final   Triglycerides  Date Value Ref Range Status  05/25/2022 105 0 - 149 mg/dL Final         Passed - Patient is not pregnant      Passed - Valid encounter within last 12 months    Recent Outpatient Visits           2 months ago Adjustment disorder with mixed anxiety and depressed mood   [place] [facility] Grayce Sessions, NP   1 year ago Need for immunization against influenza   [place] [facility] Grayce Sessions, NP   1 year ago Primary hypertension   [place] [facility] [place], Cornelius Moras, RPH-CPP   1 year ago Screening for diabetes mellitus   [place] [facility] Grayce Sessions, NP       Future Appointments             Tomorrow Grayce Sessions, NP [place] [facility]

## 2023-06-02 ENCOUNTER — Ambulatory Visit (INDEPENDENT_AMBULATORY_CARE_PROVIDER_SITE_OTHER): Payer: PRIVATE HEALTH INSURANCE | Admitting: Primary Care

## 2023-06-06 ENCOUNTER — Other Ambulatory Visit: Payer: Self-pay

## 2023-06-06 ENCOUNTER — Encounter (INDEPENDENT_AMBULATORY_CARE_PROVIDER_SITE_OTHER): Payer: Self-pay | Admitting: Primary Care

## 2023-06-06 ENCOUNTER — Ambulatory Visit (INDEPENDENT_AMBULATORY_CARE_PROVIDER_SITE_OTHER): Payer: 59 | Admitting: Primary Care

## 2023-06-06 VITALS — BP 126/69 | HR 52 | Resp 16 | Wt 126.2 lb

## 2023-06-06 DIAGNOSIS — Z23 Encounter for immunization: Secondary | ICD-10-CM | POA: Diagnosis not present

## 2023-06-06 DIAGNOSIS — H6123 Impacted cerumen, bilateral: Secondary | ICD-10-CM | POA: Diagnosis not present

## 2023-06-06 DIAGNOSIS — E2839 Other primary ovarian failure: Secondary | ICD-10-CM | POA: Diagnosis not present

## 2023-06-06 DIAGNOSIS — Z1231 Encounter for screening mammogram for malignant neoplasm of breast: Secondary | ICD-10-CM

## 2023-06-06 DIAGNOSIS — Z122 Encounter for screening for malignant neoplasm of respiratory organs: Secondary | ICD-10-CM

## 2023-06-06 DIAGNOSIS — I10 Essential (primary) hypertension: Secondary | ICD-10-CM | POA: Diagnosis not present

## 2023-06-06 DIAGNOSIS — Z760 Encounter for issue of repeat prescription: Secondary | ICD-10-CM

## 2023-06-06 MED ORDER — HYDROCHLOROTHIAZIDE 25 MG PO TABS
25.0000 mg | ORAL_TABLET | Freq: Every day | ORAL | 1 refills | Status: DC
Start: 2023-06-06 — End: 2024-01-12
  Filled 2023-06-06: qty 90, 90d supply, fill #0
  Filled 2023-10-24 (×2): qty 90, 90d supply, fill #1

## 2023-06-06 MED ORDER — AMLODIPINE BESYLATE 10 MG PO TABS
10.0000 mg | ORAL_TABLET | Freq: Every day | ORAL | 1 refills | Status: DC
Start: 2023-06-06 — End: 2024-01-12
  Filled 2023-06-06: qty 90, 90d supply, fill #0
  Filled 2023-10-24 (×2): qty 90, 90d supply, fill #1

## 2023-06-06 NOTE — Progress Notes (Signed)
[facility]  Danielle Morton, is a 67 y.o. female  WUJ:[id number]  [id number]  DOB - [birth year]  Chief Complaint  Patient presents with   Hypertension       Subjective:   Danielle Morton is a 67 y.o. female here today for a follow up visit for HTN. She is also, followed by cardiology and Bp is well controlled. Patient has No headache, No chest pain, No abdominal pain - No Nausea, No new weakness tingling or numbness, No Cough - shortness of breath  No problems updated.  No Known Allergies  Past Medical History:  Diagnosis Date   Hypertension    PVD (peripheral vascular disease) (HCC)     Current Outpatient Medications on File Prior to Visit  Medication Sig Dispense Refill   atorvastatin (LIPITOR) 40 MG tablet Take 1 tablet (40 mg total) by mouth daily. 90 tablet 0   potassium chloride SA (KLOR-CON M) 20 MEQ tablet Take 1 tablet (20 mEq total) by mouth daily. 90 tablet 1   rivaroxaban (XARELTO) 20 MG TABS tablet Take 1 tablet (20 mg total) by mouth daily with supper. 90 tablet 1   escitalopram (LEXAPRO) 5 MG tablet Take 1 tablet (5 mg total) by mouth daily. (Patient not taking: Reported on 06/06/2023) 30 tablet 1   No current facility-administered medications on file prior to visit.    Objective:   Vitals:   06/06/23 1400  BP: 126/69  Pulse: (!) 52  Resp: 16  SpO2: 98%  Weight: 126 lb 3.2 oz (57.2 kg)    Comprehensive ROS Pertinent positive and negative noted in HPI   Exam General appearance : Awake, alert, not in any distress. Speech Clear. Not toxic looking HEENT: Atraumatic and Normocephalic, pupils equally reactive to light and accomodation, bilateral cerumen impaction Neck: Supple, no JVD. No cervical lymphadenopathy.  Chest: Good air entry bilaterally, no added sounds  CVS: S1 S2 regular, no murmurs.  Abdomen: Bowel sounds present, Non tender and not distended with no gaurding, rigidity or rebound. Extremities: B/L Lower Ext shows no edema,  both legs are warm to touch ( unsteady gait using quad cane) Neurology: Awake alert, and oriented X 3, CN II-XII intact, Non focal Skin: No Rash  Data Review Lab Results  Component Value Date   HGBA1C 5.5 02/22/2022    Assessment & Plan  Danielle Morton was seen today for hypertension.  Diagnoses and all orders for this visit:  Encounter for immunization -     Flu Vaccine Trivalent High Dose (Fluad)  Essential hypertension -     amLODipine (NORVASC) 10 MG tablet; Take 1 tablet (10 mg total) by mouth daily. -     hydrochlorothiazide (HYDRODIURIL) 25 MG tablet; Take 1 tablet (25 mg total) by mouth daily.  Medication refill -     amLODipine (NORVASC) 10 MG tablet; Take 1 tablet (10 mg total) by mouth daily.  Estrogen deficiency -     DG Bone Density; Future  Screening for lung cancer -     CT CHEST LUNG CA SCREEN LOW DOSE W/O CM; Future -     DG Bone Density; Future  Encounter for screening mammogram for malignant neoplasm of breast -     MM DIGITAL SCREENING BILATERAL; Future  Bilateral impacted cerumen  Rtn for ear lavage   Patient have been counseled extensively about nutrition and exercise. Other issues discussed during this visit include: low cholesterol diet, weight control and daily exercise, foot care, annual eye examinations at Ophthalmology, importance of adherence  with medications and regular follow-up. We also discussed long term complications of uncontrolled diabetes and hypertension.   Return in about 3 months (around 09/05/2023) for complete health maintenace .  The patient was given clear instructions to go to ER or return to medical center if symptoms don't improve, worsen or new problems develop. The patient verbalized understanding. The patient was told to call to get lab results if they haven't heard anything in the next week.   This note has been created with Education officer, environmental. Any transcriptional errors are  unintentional.   Grayce Sessions, NP 06/06/2023, 2:32 PM

## 2023-06-06 NOTE — Progress Notes (Signed)
Danielle Morton 11/03/1955 [id number]  Patient attempted to be outreached by Thomasene Ripple, PharmD Candidate to discuss hypertension.   Thomasene Ripple, Student-PharmD

## 2023-06-07 ENCOUNTER — Other Ambulatory Visit: Payer: Self-pay

## 2023-06-09 ENCOUNTER — Other Ambulatory Visit: Payer: Self-pay

## 2023-06-10 ENCOUNTER — Other Ambulatory Visit: Payer: Self-pay

## 2023-06-10 ENCOUNTER — Ambulatory Visit (HOSPITAL_COMMUNITY)
Admission: RE | Admit: 2023-06-10 | Discharge: 2023-06-10 | Disposition: A | Discharge status: Home / Self Care | Payer: 59 | Source: Ambulatory Visit | Attending: Cardiovascular Disease | Admitting: Cardiovascular Disease

## 2023-06-10 DIAGNOSIS — R0989 Other specified symptoms and signs involving the circulatory and respiratory systems: Secondary | ICD-10-CM | POA: Diagnosis present

## 2023-06-10 DIAGNOSIS — E785 Hyperlipidemia, unspecified: Secondary | ICD-10-CM | POA: Diagnosis present

## 2023-06-10 DIAGNOSIS — I10 Essential (primary) hypertension: Secondary | ICD-10-CM | POA: Insufficient documentation

## 2023-06-13 ENCOUNTER — Ambulatory Visit (INDEPENDENT_AMBULATORY_CARE_PROVIDER_SITE_OTHER): Payer: PRIVATE HEALTH INSURANCE | Admitting: Primary Care

## 2023-06-14 ENCOUNTER — Other Ambulatory Visit (HOSPITAL_COMMUNITY): Payer: Self-pay

## 2023-06-14 DIAGNOSIS — R0989 Other specified symptoms and signs involving the circulatory and respiratory systems: Secondary | ICD-10-CM

## 2023-06-24 ENCOUNTER — Other Ambulatory Visit: Payer: Self-pay | Admitting: Primary Care

## 2023-06-24 DIAGNOSIS — Z1212 Encounter for screening for malignant neoplasm of rectum: Secondary | ICD-10-CM

## 2023-06-24 DIAGNOSIS — Z1211 Encounter for screening for malignant neoplasm of colon: Secondary | ICD-10-CM

## 2023-07-19 LAB — COLOGUARD: COLOGUARD: POSITIVE — AB

## 2023-07-20 ENCOUNTER — Other Ambulatory Visit (INDEPENDENT_AMBULATORY_CARE_PROVIDER_SITE_OTHER): Payer: Self-pay | Admitting: Primary Care

## 2023-07-20 DIAGNOSIS — K921 Melena: Secondary | ICD-10-CM

## 2023-07-29 ENCOUNTER — Encounter (INDEPENDENT_AMBULATORY_CARE_PROVIDER_SITE_OTHER): Payer: Self-pay

## 2023-08-10 ENCOUNTER — Other Ambulatory Visit: Payer: Self-pay

## 2023-08-11 ENCOUNTER — Telehealth (INDEPENDENT_AMBULATORY_CARE_PROVIDER_SITE_OTHER): Payer: Self-pay

## 2023-08-11 ENCOUNTER — Encounter: Payer: Self-pay | Admitting: Gastroenterology

## 2023-08-11 NOTE — Telephone Encounter (Signed)
Pt given lab results per notes of Marcelino Duster, NP from 07/11/23 on 08/11/23. Pt verbalized understanding. Pt given [facility] # to call for appt. [facility] [address] [place], [place] [address] PH# [phone]   Grayce Sessions, NP 07/20/2023  5:04 PM EDT     COLOGUARD Positive Abnormal referring to GI

## 2023-09-05 ENCOUNTER — Ambulatory Visit (INDEPENDENT_AMBULATORY_CARE_PROVIDER_SITE_OTHER): Payer: PRIVATE HEALTH INSURANCE | Admitting: Primary Care

## 2023-09-05 DIAGNOSIS — I10 Essential (primary) hypertension: Secondary | ICD-10-CM

## 2023-09-05 DIAGNOSIS — Z1159 Encounter for screening for other viral diseases: Secondary | ICD-10-CM

## 2023-09-05 DIAGNOSIS — Z13220 Encounter for screening for lipoid disorders: Secondary | ICD-10-CM

## 2023-09-09 ENCOUNTER — Ambulatory Visit (INDEPENDENT_AMBULATORY_CARE_PROVIDER_SITE_OTHER): Payer: 59 | Admitting: Gastroenterology

## 2023-09-09 ENCOUNTER — Encounter: Payer: Self-pay | Admitting: Gastroenterology

## 2023-09-09 VITALS — BP 132/82 | HR 72 | Ht 64.0 in | Wt 123.1 lb

## 2023-09-09 DIAGNOSIS — I739 Peripheral vascular disease, unspecified: Secondary | ICD-10-CM

## 2023-09-09 DIAGNOSIS — R630 Anorexia: Secondary | ICD-10-CM | POA: Diagnosis not present

## 2023-09-09 DIAGNOSIS — R195 Other fecal abnormalities: Secondary | ICD-10-CM

## 2023-09-09 NOTE — Progress Notes (Signed)
Chief Complaint: positive cologuard Primary GI MD: Gentry Fitz  HPI: 67 year old female history of PAD (on Xarelto), mesenteric ischemia, significantly high cardiac calcium score, presents for evaluation of positive Cologuard.  Patient states she feels her Cologuard is positive because she had urine mixed in with her stool sample.  Denies blood in her urine but feels that may have caused an alteration in the stool study.  Lab work CBC and CMP September 2023 were normal.  She is scheduled to get repeat labs with her PCP at follow-up physical.  She was last seen by her cardiologist September 2023.  She has a history of hypertension which is under better control and hyperlipidemia.  She had carotid Dopplers which showed moderate right ICA stenosis and a coronary calcium score of 1179.  She is asymptomatic but was scheduled for a Myoview perfusion study and no showed to this.  She has not followed up with her cardiologist since.  Patient states she denies GI symptoms.  Denies change in bowel habits, melena, hematochezia, nausea, vomiting.  Denies family history of colon cancer.  Denies previous colonoscopy.  Patient states she feels she should see her cardiologist prior to undergoing sedation especially since she has not seen him in well over a year.  Patient has not been eating.  Her son got out of jail after 20 years and promised he would not return.  He did return to jail and this has made her not want to eat.  Appears she has lost 3 pounds since September.  She states she is forcing herself to eat as a result.  She denies pain with eating  Patient had a CTA abdomen pelvis April 2023 for lower abdominal pain and vomiting.  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  No bowel wall thickening.  Occlusion of splenic artery.  Left common iliac artery stent.  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  Occlusion of bilateral superficial  femoral arteries.  Past Medical History:  Diagnosis Date   Hypertension    PVD (peripheral vascular disease) (HCC)     Past Surgical History:  Procedure Laterality Date   ANGIOPLASTY N/A 12/28/2021   Procedure: BALLOON ANGIOPLASTY OF SUPERIOR MESENTERIC ARTERY;  Surgeon: Victorino Sparrow, MD;  Location: [facility] OR;  Service: Vascular;  Laterality: N/A;   ENDARTERECTOMY FEMORAL Left 08/15/2018   Procedure: ENDARTERECTOMY COMMON FEMORAL;  Surgeon: Maeola Harman, MD;  Location: [facility] OR;  Service: Vascular;  Laterality: Left;   INSERTION OF ILIAC STENT Left 08/15/2018   Procedure: INSERTION OF ILIAC STENT RETROGRADE;  Surgeon: Maeola Harman, MD;  Location: [facility] OR;  Service: Vascular;  Laterality: Left;   LOWER EXTREMITY ANGIOGRAPHY N/A 08/14/2018   Procedure: LOWER EXTREMITY ANGIOGRAPHY;  Surgeon: Maeola Harman, MD;  Location: [facility] INVASIVE CV LAB;  Service: Cardiovascular;  Laterality: N/A;   MESENTERIC ARTERY BYPASS Left 12/28/2021   Procedure: MESENTERIC ANGIOGRAPHY FROM LEFT BRACHIAL ARTERY;  Surgeon: Victorino Sparrow, MD;  Location: [facility] OR;  Service: Vascular;  Laterality: Left;    Current Outpatient Medications  Medication Sig Dispense Refill   amLODipine (NORVASC) 10 MG tablet Take 1 tablet (10 mg total) by mouth daily.Patient will need an office visit for further refills. 90 tablet 1   hydrochlorothiazide (HYDRODIURIL) 25 MG tablet Take 1 tablet (25 mg total) by mouth daily. 90 tablet 1   atorvastatin (LIPITOR) 40 MG tablet Take 1 tablet (40 mg total) by mouth daily. (Patient not taking:  Reported on 09/09/2023) 90 tablet 0   escitalopram (LEXAPRO) 5 MG tablet Take 1 tablet (5 mg total) by mouth daily. (Patient not taking: Reported on 09/09/2023) 30 tablet 1   potassium chloride SA (KLOR-CON M) 20 MEQ tablet Take 1 tablet (20 mEq total) by mouth daily. (Patient not taking: Reported on 09/09/2023) 90 tablet 1   rivaroxaban (XARELTO) 20 MG TABS tablet Take 1  tablet (20 mg total) by mouth daily with supper. (Patient not taking: Reported on 09/09/2023) 90 tablet 1   No current facility-administered medications for this visit.    Allergies as of 09/09/2023   (No Known Allergies)    Family History  Problem Relation Age of Onset   Colon cancer Neg Hx    Pancreatic cancer Neg Hx    Stomach cancer Neg Hx    Esophageal cancer Neg Hx     Social History   Socioeconomic History   Marital status: Single    Spouse name: Not on file   Number of children: Not on file   Years of education: Not on file   Highest education level: Not on file  Occupational History   Not on file  Tobacco Use   Smoking status: Former    Current packs/day: 1.00    Average packs/day: 1 pack/day for 30.0 years (30.0 ttl pk-yrs)    Types: Cigarettes   Smokeless tobacco: Never  Vaping Use   Vaping status: Never Used  Substance and Sexual Activity   Alcohol use: Not Currently    Alcohol/week: 3.0 standard drinks of alcohol    Types: 3 Cans of beer per week   Drug use: No   Sexual activity: Not on file  Other Topics Concern   Not on file  Social History Narrative   Not on file   Social Drivers of Health   Financial Resource Strain: Not on file  Food Insecurity: Not on file  Transportation Needs: Unmet Transportation Needs (04/15/2022)   PRAPARE - Administrator, Civil Service (Medical): Yes    Lack of Transportation (Non-Medical): Yes  Physical Activity: Not on file  Stress: Not on file  Social Connections: Unknown (02/02/2022)   [facility]   Social Network    Social Network: Not on file  Intimate Partner Violence: Unknown (12/25/2021)   Received from [employer], [facility]   HITS    Physically Hurt: Not on file    Insult or Talk Down To: Not on file    Threaten Physical Harm: Not on file    Scream or Curse: Not on file    Review of Systems:    Constitutional: No weight loss, fever, chills, weakness  or fatigue HEENT: Eyes: No change in vision               Ears, Nose, Throat:  No change in hearing or congestion Skin: No rash or itching Cardiovascular: No chest pain, chest pressure or palpitations   Respiratory: No SOB or cough Gastrointestinal: See HPI and otherwise negative Genitourinary: No dysuria or change in urinary frequency Neurological: No headache, dizziness or syncope Musculoskeletal: No new muscle or joint pain Hematologic: No bleeding or bruising Psychiatric: No history of depression or anxiety    Physical Exam:  Vital signs: BP 132/82 (BP Location: Left Arm, Patient Position: Sitting, Cuff Size: Normal)   Pulse 72   Ht 5\' 4"  (1.626 m)   Wt 123 lb 2 oz (55.8 kg)   SpO2 98%  BMI 21.13 kg/m   Constitutional: NAD, thin, frail, elderly appearing, alert and cooperative Head:  Normocephalic and atraumatic. Eyes:   PEERL, EOMI. No icterus. Conjunctiva pink. Respiratory: Respirations even and unlabored. Lungs clear to auscultation bilaterally.   No wheezes, crackles, or rhonchi.  Cardiovascular:  Regular rate and rhythm. No peripheral edema, cyanosis or pallor.  Gastrointestinal:  Soft, nondistended, nontender. No rebound or guarding. Normal bowel sounds. No appreciable masses or hepatomegaly. Rectal:  Not performed.  Msk:  Symmetrical without gross deformities. Without edema, no deformity or joint abnormality.  Neurologic:  Alert and  oriented x4;  grossly normal neurologically.  Skin:   Dry and intact without significant lesions or rashes. Psychiatric: Oriented to person, place and time. Demonstrates good judgement and reason without abnormal affect or behaviors.   RELEVANT LABS AND IMAGING: CBC    Component Value Date/Time   WBC 7.5 05/25/2022 1557   WBC 6.8 12/31/2021 0228   RBC 5.14 05/25/2022 1557   RBC 3.73 (L) 12/31/2021 0228   HGB 15.3 05/25/2022 1557   HCT 46.3 05/25/2022 1557   PLT 259 05/25/2022 1557   MCV 90 05/25/2022 1557   MCH 29.8 05/25/2022  1557   MCH 32.7 12/31/2021 0228   MCHC 33.0 05/25/2022 1557   MCHC 35.1 12/31/2021 0228   RDW 14.2 05/25/2022 1557   LYMPHSABS 2.7 05/25/2022 1557   MONOABS 0.6 08/14/2018 0207   EOSABS 0.0 05/25/2022 1557   BASOSABS 0.0 05/25/2022 1557    CMP     Component Value Date/Time   NA 139 05/25/2022 1557   K 3.0 (L) 05/25/2022 1557   CL 96 05/25/2022 1557   CO2 26 05/25/2022 1557   GLUCOSE 95 05/25/2022 1557   GLUCOSE 105 (H) 12/31/2021 0228   BUN 25 05/25/2022 1557   CREATININE 0.77 05/25/2022 1557   CALCIUM 10.3 05/25/2022 1557   PROT 8.5 05/25/2022 1557   ALBUMIN 4.9 05/25/2022 1557   AST 13 05/25/2022 1557   ALT 8 05/25/2022 1557   ALKPHOS 130 (H) 05/25/2022 1557   BILITOT 0.2 05/25/2022 1557   GFRNONAA >60 12/31/2021 0228   GFRAA >60 08/16/2018 0302     Assessment/Plan:   Positive Cologuard Positive Cologuard with no history of colonoscopy.  She is currently undergoing cardiac workup for significantly elevated cardiac calcium score in which she no showed a perfusion study.  She is on Xarelto for PVD.  She would like to see her cardiologist prior to undergoing procedure.  I think this is reasonable especially since it has been over 1 year. - Schedule appoint with cardiologist - Follow-up with me in 4 to 6 months and at that time after seeing cardiology we can pursue colonoscopy - She is currently asymptomatic without red flag symptoms - If symptoms develop please let us know  PAD On Xarelto.  Will need blood thinner cease prior to colonoscopy evaluation.  She has not followed up with her cardiologist in over a year and will need follow-up  Loss of appetite Likely secondary to stress she is undergoing with her son.  No pain with eating.  Rebound weight loss over the course of 3 months.  Recommend follow-up with PCP for consideration of psych meds and possible counseling - Continue to monitor  [facility] 09/09/2023, 10:24 AM  Cc:  Grayce Sessions, NP

## 2023-09-09 NOTE — Patient Instructions (Addendum)
Please schedule an appointment to see your cardiologist (Dr. Allyson Sabal). I have put in a referral for them to contact you for follow-up. Please call them also.   Please schedule a follow up visit with Boone Master, PA in 4-6 months.   _______________________________________________________  If your blood pressure at your visit was 140/90 or greater, please contact your primary care physician to follow up on this.  _______________________________________________________  If you are age 81 or older, your body mass index should be between 23-30. Your Body mass index is 21.13 kg/m. If this is out of the aforementioned range listed, please consider follow up with your Primary Care Provider.  If you are age 56 or younger, your body mass index should be between 19-25. Your Body mass index is 21.13 kg/m. If this is out of the aformentioned range listed, please consider follow up with your Primary Care Provider.   ________________________________________________________  The [place] [facility] providers would like to encourage you to use [facility] to communicate with providers for non-urgent requests or questions.  Due to long hold times on the telephone, sending your provider a message by [facility] may be a faster and more efficient way to get a response.  Please allow 48 business hours for a response.  Please remember that this is for non-urgent requests.  _______________________________________________________

## 2023-09-16 ENCOUNTER — Ambulatory Visit: Payer: 59 | Admitting: Cardiovascular Disease

## 2023-10-05 ENCOUNTER — Other Ambulatory Visit (HOSPITAL_BASED_OUTPATIENT_CLINIC_OR_DEPARTMENT_OTHER): Payer: Self-pay

## 2023-10-20 NOTE — Progress Notes (Signed)
Agree with assessment/plan. Needs colon after cardiology clearance for positive Cologuard after holding Xarelto  Edman Circle, MD [facility] [phone]

## 2023-10-24 ENCOUNTER — Other Ambulatory Visit: Payer: Self-pay

## 2023-10-25 ENCOUNTER — Other Ambulatory Visit: Payer: Self-pay

## 2023-10-27 ENCOUNTER — Encounter (INDEPENDENT_AMBULATORY_CARE_PROVIDER_SITE_OTHER): Payer: Self-pay | Admitting: *Deleted

## 2023-11-01 ENCOUNTER — Ambulatory Visit: Payer: Medicare HMO | Attending: Cardiovascular Disease | Admitting: Cardiovascular Disease

## 2023-11-02 ENCOUNTER — Encounter: Payer: Self-pay | Admitting: Cardiovascular Disease

## 2023-11-02 ENCOUNTER — Ambulatory Visit (INDEPENDENT_AMBULATORY_CARE_PROVIDER_SITE_OTHER): Payer: Medicare PPO | Admitting: Podiatry

## 2023-11-02 DIAGNOSIS — Z91199 Patient's noncompliance with other medical treatment and regimen due to unspecified reason: Secondary | ICD-10-CM

## 2023-11-02 NOTE — Progress Notes (Signed)
No show

## 2024-01-05 ENCOUNTER — Telehealth (INDEPENDENT_AMBULATORY_CARE_PROVIDER_SITE_OTHER): Payer: Self-pay | Admitting: Primary Care

## 2024-01-05 NOTE — Telephone Encounter (Signed)
 Left VM with pt about their upcoming appt.

## 2024-01-11 ENCOUNTER — Telehealth (INDEPENDENT_AMBULATORY_CARE_PROVIDER_SITE_OTHER): Payer: Self-pay | Admitting: Primary Care

## 2024-01-11 NOTE — Telephone Encounter (Signed)
 Called pt to confirm appt. Pt will be present.

## 2024-01-12 ENCOUNTER — Other Ambulatory Visit: Payer: Self-pay

## 2024-01-12 ENCOUNTER — Encounter (INDEPENDENT_AMBULATORY_CARE_PROVIDER_SITE_OTHER): Payer: Self-pay | Admitting: Primary Care

## 2024-01-12 ENCOUNTER — Other Ambulatory Visit (HOSPITAL_COMMUNITY): Payer: Self-pay

## 2024-01-12 ENCOUNTER — Ambulatory Visit (INDEPENDENT_AMBULATORY_CARE_PROVIDER_SITE_OTHER): Admitting: Primary Care

## 2024-01-12 VITALS — BP 118/77 | HR 60 | Resp 16 | Ht 64.0 in | Wt 128.8 lb

## 2024-01-12 DIAGNOSIS — Z79899 Other long term (current) drug therapy: Secondary | ICD-10-CM

## 2024-01-12 DIAGNOSIS — L918 Other hypertrophic disorders of the skin: Secondary | ICD-10-CM

## 2024-01-12 DIAGNOSIS — Z13220 Encounter for screening for lipoid disorders: Secondary | ICD-10-CM

## 2024-01-12 DIAGNOSIS — R269 Unspecified abnormalities of gait and mobility: Secondary | ICD-10-CM

## 2024-01-12 DIAGNOSIS — I10 Essential (primary) hypertension: Secondary | ICD-10-CM

## 2024-01-12 DIAGNOSIS — Z23 Encounter for immunization: Secondary | ICD-10-CM

## 2024-01-12 DIAGNOSIS — Z1159 Encounter for screening for other viral diseases: Secondary | ICD-10-CM | POA: Diagnosis not present

## 2024-01-12 DIAGNOSIS — M79661 Pain in right lower leg: Secondary | ICD-10-CM

## 2024-01-12 DIAGNOSIS — Z760 Encounter for issue of repeat prescription: Secondary | ICD-10-CM | POA: Diagnosis not present

## 2024-01-12 DIAGNOSIS — F17210 Nicotine dependence, cigarettes, uncomplicated: Secondary | ICD-10-CM

## 2024-01-12 DIAGNOSIS — Z720 Tobacco use: Secondary | ICD-10-CM

## 2024-01-12 DIAGNOSIS — M216X9 Other acquired deformities of unspecified foot: Secondary | ICD-10-CM

## 2024-01-12 DIAGNOSIS — E2839 Other primary ovarian failure: Secondary | ICD-10-CM

## 2024-01-12 DIAGNOSIS — Z1231 Encounter for screening mammogram for malignant neoplasm of breast: Secondary | ICD-10-CM

## 2024-01-12 MED ORDER — HYDROCHLOROTHIAZIDE 25 MG PO TABS
25.0000 mg | ORAL_TABLET | Freq: Every day | ORAL | 1 refills | Status: DC
  Filled 2024-01-12: qty 30, 30d supply, fill #0
  Filled 2024-01-12: qty 90, 90d supply, fill #0
  Filled 2024-04-25: qty 90, 90d supply, fill #1

## 2024-01-12 MED ORDER — AMLODIPINE BESYLATE 5 MG PO TABS
5.0000 mg | ORAL_TABLET | Freq: Every day | ORAL | 1 refills | Status: DC
  Filled 2024-01-12: qty 90, 90d supply, fill #0
  Filled 2024-01-12: qty 30, 30d supply, fill #0
  Filled 2024-04-06: qty 90, 90d supply, fill #1

## 2024-01-12 NOTE — Patient Instructions (Signed)
Pneumococcal Conjugate Vaccine: What You Need to Know Many vaccine information statements are available in Spanish and other languages. See PromoAge.com.br. 1. Why get vaccinated? Pneumococcal conjugate vaccine can prevent pneumococcal disease. Pneumococcal disease refers to any illness caused by pneumococcal bacteria. These bacteria can cause many types of illnesses, including pneumonia, which is an infection of the lungs. Pneumococcal bacteria are one of the most common causes of pneumonia. Besides pneumonia, pneumococcal bacteria can also cause: Ear infections Sinus infections Meningitis (infection of the tissue covering the brain and spinal cord) Bacteremia (infection of the blood) Anyone can get pneumococcal disease, but children under 13 years old, people with certain medical conditions or other risk factors, and adults 65 years or older are at the highest risk. Most pneumococcal infections are mild. However, some can result in long-term problems, such as brain damage or hearing loss. Meningitis, bacteremia, and pneumonia caused by pneumococcal disease can be fatal. 2. Pneumococcal conjugate vaccine Pneumococcal conjugate vaccine helps protect against bacteria that cause pneumococcal disease. There are three pneumococcal conjugate vaccines (PCV13, PCV15, and PCV20). The different vaccines are recommended for different people based on age and medical status. Your health care provider can help you determine which type of pneumococcal conjugate vaccine, and how many doses, you should receive. Infants and young children usually need 4 doses of pneumococcal conjugate vaccine. These doses are recommended at 2, 4, 6, and 58-29 months of age. Older children and adolescents might need pneumococcal conjugate vaccine depending on their age and medical conditions or other risk factors if they did not receive the recommended doses as infants or young children. Adults 19 through 11 years old with certain  medical conditions or other risk factors who have not already received pneumococcal conjugate vaccine should receive pneumococcal conjugate vaccine. Adults 65 years or older who have not previously received pneumococcal conjugate vaccine should receive pneumococcal conjugate vaccine. Some people with certain medical conditions are also recommended to receive pneumococcal polysaccharide vaccine (a different type of pneumococcal vaccine known as PPSV23). Some adults who have previously received a pneumococcal conjugate vaccine may be recommended to receive another pneumococcal conjugate vaccine. 3. Talk with your health care provider Tell your vaccination provider if the person getting the vaccine: Has had an allergic reaction after a previous dose of any type of pneumococcal conjugate vaccine (PCV13, PCV15, PCV20, or an earlier pneumococcal conjugate vaccine known as PCV7), or to any vaccine containing diphtheria toxoid (for example, DTaP), or has any severe, life-threatening allergies In some cases, your health care provider may decide to postpone pneumococcal conjugate vaccination until a future visit. People with minor illnesses, such as a cold, may be vaccinated. People who are moderately or severely ill should usually wait until they recover. Your health care provider can give you more information. 4. Risks of a vaccine reaction Redness, swelling, pain, or tenderness where the shot is given, and fever, loss of appetite, fussiness (irritability), feeling tired, headache, muscle aches, joint pain, and chills can happen after pneumococcal conjugate vaccination. Young children may be at increased risk for seizures caused by fever after a pneumococcal conjugate vaccine if it is administered at the same time as inactivated influenza vaccine. Ask your health care provider for more information. People sometimes faint after medical procedures, including vaccination. Tell your provider if you feel dizzy or  have vision changes or ringing in the ears. As with any medicine, there is a very remote chance of a vaccine causing a severe allergic reaction, other serious injury, or death. 5.  What if there is a serious problem? An allergic reaction could occur after the vaccinated person leaves the clinic. If you see signs of a severe allergic reaction (hives, swelling of the face and throat, difficulty breathing, a fast heartbeat, dizziness, or weakness), call 9-1-1 and get the person to the nearest hospital. For other signs that concern you, call your health care provider. Adverse reactions should be reported to the Vaccine Adverse Event Reporting System (VAERS). Your health care provider will usually file this report, or you can do it yourself. Visit the VAERS website at www.vaers.LAgents.no or call 913-439-3438. VAERS is only for reporting reactions, and VAERS staff members do not give medical advice. 6. The National Vaccine Injury Compensation Program The [employer] Vaccine Injury Compensation Program (VICP) is a federal program that was created to compensate people who may have been injured by certain vaccines. Claims regarding alleged injury or death due to vaccination have a time limit for filing, which may be as short as two years. Visit the VICP website at SpiritualWord.at or call 860 253 0829 to learn about the program and about filing a claim. 7. How can I learn more? Ask your health care provider. Call your local or state health department. Visit the website of the Food and Drug Administration (FDA) for vaccine package inserts and additional information at FinderList.no. Contact the Centers for Disease Control and Prevention (CDC): Call 4055360835 (1-800-CDC-INFO) or Visit CDC's website at PicCapture.uy. Source: CDC Vaccine Information Statement (Interim) Pneumococcal Conjugate Vaccine (01/29/2022) This same material is available at  FootballExhibition.com.br for no charge. This information is not intended to replace advice given to you by your health care provider. Make sure you discuss any questions you have with your health care provider. Document Revised: 12/22/2022 Document Reviewed: 09/27/2022 Elsevier Patient Education  2024 [employer].

## 2024-01-12 NOTE — Progress Notes (Signed)
 [facility]  Danielle Morton, is a 68 y.o. female  [id number]  [id number]  DOB - Apr 15, 1956  Chief Complaint  Patient presents with   Leg Pain    Hurt last year on the job and still giving her trouble    Skin Tag    Under right arm pit  Painful        Subjective:   Danielle Morton is a 68 y.o. female here today for an acute visit. Patient is complaining of a skin tag under her arm pit that rubs causing pain and irritation. She also fell last year at work on her right leg and she continues to have pain and burning on the foot top, bottom inside She is also behind in health maintenance and will begin referrals today. No problems updated.  Comprehensive ROS Pertinent positive and negative noted in HPI   No Known Allergies  Past Medical History:  Diagnosis Date   Hypertension    PVD (peripheral vascular disease) (HCC)     No current outpatient medications on file prior to visit.   No current facility-administered medications on file prior to visit.   Health Maintenance  Topic Date Due   Medicare Annual Wellness Visit  Never done   Hepatitis C Screening  Never done   DTaP/Tdap/Td vaccine (1 - Tdap) Never done   Zoster (Shingles) Vaccine (1 of 2) Never done   Colon Cancer Screening  Never done   Screening for Lung Cancer  Never done   Mammogram  Never done   DEXA scan (bone density measurement)  Never done   COVID-19 Vaccine (4 - 2024-25 season) 05/22/2023   Flu Shot  04/20/2024   Pneumonia Vaccine  Completed   HPV Vaccine  Aged Out   Meningitis B Vaccine  Aged Out    Objective:   Vitals:   01/12/24 0940  BP: 118/77  Pulse: 60  Resp: 16  SpO2: 98%  Weight: 128 lb 13.6 oz (58.4 kg)  Height: 5\' 4"  (1.626 m)    Physical Exam Vitals reviewed.  Constitutional:      Appearance: Normal appearance.  HENT:     Head: Normocephalic.     Right Ear: Tympanic membrane, ear canal and external ear normal.     Left Ear: Tympanic membrane, ear  canal and external ear normal.     Nose: Nose normal.     Mouth/Throat:     Mouth: Mucous membranes are moist.  Eyes:     Extraocular Movements: Extraocular movements intact.     Pupils: Pupils are equal, round, and reactive to light.  Cardiovascular:     Rate and Rhythm: Normal rate.  Pulmonary:     Effort: Pulmonary effort is normal.     Breath sounds: Normal breath sounds.  Abdominal:     General: Bowel sounds are normal.     Palpations: Abdomen is soft.  Musculoskeletal:        General: Normal range of motion.     Cervical back: Normal range of motion.  Skin:    General: Skin is warm and dry.  Neurological:     Mental Status: She is alert and oriented to person, place, and time.  Psychiatric:        Mood and Affect: Mood normal.        Behavior: Behavior normal.        Thought Content: Thought content normal.     Assessment & Plan  Toyoko was seen today for leg pain and skin  tag.  Diagnoses and all orders for this visit:  Encounter for immunization -     Pneumococcal conjugate vaccine 20-valent  Essential hypertension Well controlled  DIET: Limit salt intake, read nutrition labels to check salt content, limit fried and high fatty foods  Avoid using multisymptom OTC cold preparations that generally contain sudafed which can rise BP. Consult with pharmacist on best cold relief products to use for persons with HTN EXERCISE Discussed incorporating exercise such as walking - 30 minutes most days of the week and can do in 10 minute intervals    -     CMP14+EGFR -     hydrochlorothiazide  (HYDRODIURIL ) 25 MG tablet; Take 1 tablet (25 mg total) by mouth daily. -     amLODipine  (NORVASC ) 5 MG tablet; Take 1 tablet (5 mg total) by mouth daily.  Medication refill -     amLODipine  (NORVASC ) 5 MG tablet; Take 1 tablet (5 mg total) by mouth daily.  Encounter for screening mammogram for malignant neoplasm of breast -     MM DIGITAL SCREENING BILATERAL; Future  Estrogen  deficiency -     DG Bone Density; Future  Lipid screening -     Lipid panel  Medication management -     CBC with Differential/Platelet  Encounter for HCV screening test for low risk patient -     HCV Ab w Reflex to Quant PCR  Tobacco abuse -     Ambulatory Referral for Lung Cancer Screening   Acrochordon  Ambulatory referral to Dermatology    Patient have been counseled extensively about nutrition and exercise. Other issues discussed during this visit include: low cholesterol diet, weight control and daily exercise, foot care, annual eye examinations at Ophthalmology, importance of adherence with medications and regular follow-up. We also discussed long term complications of uncontrolled diabetes and hypertension.   Schedule Medicare Wellness   The patient was given clear instructions to go to ER or return to medical center if symptoms don't improve, worsen or new problems develop. The patient verbalized understanding. The patient was told to call to get lab results if they haven't heard anything in the next week.   This note has been created with Education officer, environmental. Any transcriptional errors are unintentional.   Marius Siemens, NP 01/12/2024, 3:45 PM

## 2024-01-13 LAB — HCV INTERPRETATION

## 2024-01-13 LAB — HCV AB W REFLEX TO QUANT PCR: HCV Ab: NONREACTIVE

## 2024-01-13 LAB — CBC WITH DIFFERENTIAL/PLATELET
Basophils Absolute: 0 10*3/uL (ref 0.0–0.2)
Basos: 1 %
EOS (ABSOLUTE): 0 10*3/uL (ref 0.0–0.4)
Eos: 1 %
Hematocrit: 39.8 % (ref 34.0–46.6)
Hemoglobin: 13.4 g/dL (ref 11.1–15.9)
Immature Grans (Abs): 0 10*3/uL (ref 0.0–0.1)
Immature Granulocytes: 0 %
Lymphocytes Absolute: 3.6 10*3/uL — ABNORMAL HIGH (ref 0.7–3.1)
Lymphs: 45 %
MCH: 31.9 pg (ref 26.6–33.0)
MCHC: 33.7 g/dL (ref 31.5–35.7)
MCV: 95 fL (ref 79–97)
Monocytes Absolute: 0.6 10*3/uL (ref 0.1–0.9)
Monocytes: 7 %
Neutrophils Absolute: 3.5 10*3/uL (ref 1.4–7.0)
Neutrophils: 46 %
Platelets: 277 10*3/uL (ref 150–450)
RBC: 4.2 x10E6/uL (ref 3.77–5.28)
RDW: 14.4 % (ref 11.7–15.4)
WBC: 7.8 10*3/uL (ref 3.4–10.8)

## 2024-01-13 LAB — CMP14+EGFR
ALT: 8 IU/L (ref 0–32)
AST: 14 IU/L (ref 0–40)
Albumin: 4.4 g/dL (ref 3.9–4.9)
Alkaline Phosphatase: 108 IU/L (ref 44–121)
BUN/Creatinine Ratio: 13 (ref 12–28)
BUN: 10 mg/dL (ref 8–27)
Bilirubin Total: <0.2 mg/dL (ref 0.0–1.2)
CO2: 29 mmol/L (ref 20–29)
Calcium: 10.3 mg/dL (ref 8.7–10.3)
Chloride: 94 mmol/L — ABNORMAL LOW (ref 96–106)
Creatinine, Ser: 0.77 mg/dL (ref 0.57–1.00)
Globulin, Total: 2.7 g/dL (ref 1.5–4.5)
Glucose: 94 mg/dL (ref 70–99)
Potassium: 3.5 mmol/L (ref 3.5–5.2)
Sodium: 140 mmol/L (ref 134–144)
Total Protein: 7.1 g/dL (ref 6.0–8.5)
eGFR: 85 mL/min/{1.73_m2} (ref >59)

## 2024-01-13 LAB — LIPID PANEL
Chol/HDL Ratio: 6.3 ratio — ABNORMAL HIGH (ref 0.0–4.4)
Cholesterol, Total: 227 mg/dL — ABNORMAL HIGH (ref 100–199)
HDL: 36 mg/dL — ABNORMAL LOW (ref >39)
LDL Chol Calc (NIH): 161 mg/dL — ABNORMAL HIGH (ref 0–99)
Triglycerides: 162 mg/dL — ABNORMAL HIGH (ref 0–149)
VLDL Cholesterol Cal: 30 mg/dL (ref 5–40)

## 2024-01-17 ENCOUNTER — Ambulatory Visit: Payer: 59

## 2024-01-17 ENCOUNTER — Ambulatory Visit: Payer: Medicare HMO | Admitting: Cardiovascular Disease

## 2024-01-17 ENCOUNTER — Other Ambulatory Visit: Payer: Self-pay

## 2024-01-17 ENCOUNTER — Other Ambulatory Visit (INDEPENDENT_AMBULATORY_CARE_PROVIDER_SITE_OTHER): Payer: Self-pay | Admitting: Primary Care

## 2024-01-17 ENCOUNTER — Other Ambulatory Visit: Payer: 59

## 2024-01-17 MED ORDER — ROSUVASTATIN CALCIUM 40 MG PO TABS
40.0000 mg | ORAL_TABLET | Freq: Every day | ORAL | 1 refills | Status: DC
  Filled 2024-01-17 – 2024-02-02 (×2): qty 90, 90d supply, fill #0

## 2024-01-19 NOTE — Progress Notes (Deleted)
 Cardiology Office Note:   Date:  01/19/2024  ID:  Danielle Morton, DOB [birth year], MRN [id number] PCP:  Marius Siemens, NP  [facility] Providers Cardiologist:  Alyssa Backbone, MD Referring MD: Garr Kalata, [place]*  Chief Complaint/Reason for Referral:  *** ASSESSMENT:    1. Preoperative cardiovascular examination   2. Peripheral arterial disease (HCC)   3. Mesenteric ischemia (HCC)   4. Primary hypertension   5. Hyperlipidemia LDL goal <55   6. Aortic atherosclerosis (HCC)   7. Coronary artery calcification seen on CAT scan   8. CKD (chronic kidney disease) stage 2, GFR 60-89 ml/min     PLAN:   In order of problems listed above: Preprocedural assessment: The patient is undergoing elective colonoscopy.  This is a low risk cardiac procedure.  No further evaluation is necessary. Peripheral arterial disease: Start aspirin  81 mg, continue rosuvastatin  40 mg, strict blood pressure and lipid control Mesenteric ischemia: Start aspirin  81 mg, rosuvastatin  40 mg, strict blood pressure and lipid control Hypertension: Continue amlodipine  5 mg daily, hydrochlorothiazide  25 mg daily.**** Hyperlipidemia: Given peripheral vascular disease patient's goal LDL less than 55.  LDL recently was 161.  Start Zetia 10 mg and check lipid panel, LFTs, LP(a) and 2 months.  If still above goal will refer to pharmacy for further recommendations.*** Aortic atherosclerosis: Start aspirin  81 mg, continue rosuvastatin  40 mg, and strict blood pressure and lipid control. Coronary artery calcification: See #6 above. CKD stage II: May benefit from ACE or ARB for renal protection.        {Are you ordering a CV Procedure (e.g. stress test, cath, DCCV, TEE, etc)?   Press F2        :[id number]}   Dispo:  No follow-ups on file.      Medication Adjustments/Labs and Tests Ordered: Current medicines are reviewed at length with the patient today.  Concerns regarding medicines are outlined above.  The following  changes have been made:  {PLAN; NO CHANGE:13088:s}   Labs/tests ordered: No orders of the defined types were placed in this encounter.   Medication Changes: No orders of the defined types were placed in this encounter.   Current medicines are reviewed at length with the patient today.  The patient {ACTIONS; HAS/DOES NOT HAVE:19233} concerns regarding medicines.  I spent *** minutes reviewing all clinical data during and prior to this visit including all relevant imaging studies, laboratories, clinical information from other health systems and prior notes from both Cardiology and other specialties, interviewing the patient, conducting a complete physical examination, and coordinating care in order to formulate a comprehensive and personalized evaluation and treatment plan.   History of Present Illness:      FOCUSED PROBLEM LIST:   Peripheral arterial disease Left CFA endarterectomy plus left iliac stenting 2019 Mesenteric ischemia Balloon angioplasty SMA 2023 Hypertension Hyperlipidemia Aortic atherosclerosis Calcium  score CT 2023 Coronary calcification Calcium  score CT 2023 CKD stage II  May 2025:  Patient consents to use of AI scribe.*** The patient is a 68 year old female with the above listed medical problems here for routine cardiology follow-up as well as preprocedural assessment prior to elective colonoscopy.  The patient is normally followed by Dr. Katheryne Pane.          Current Medications: No outpatient medications have been marked as taking for the 01/20/24 encounter (Appointment) with Deema Juncaj K, MD.     Review of Systems:   Please see the history of present illness.    All other systems reviewed and are  negative.     EKGs/Labs/Other Test Reviewed:   EKG: June 2023 sinus rhythm with PACs  EKG Interpretation Date/Time:    Ventricular Rate:    PR Interval:    QRS Duration:    QT Interval:    QTC Calculation:   R Axis:      Text Interpretation:            Risk Assessment/Calculations:   {Does this patient have ATRIAL FIBRILLATION?:[id number]}      Physical Exam:   VS:  There were no vitals taken for this visit.   No BP recorded.  {Refresh Note OR Click here to enter BP  :1}***   Wt Readings from Last 3 Encounters:  01/12/24 128 lb 13.6 oz (58.4 kg)  09/09/23 123 lb 2 oz (55.8 kg)  06/06/23 126 lb 3.2 oz (57.2 kg)      GENERAL:  No apparent distress, AOx3 HEENT:  No carotid bruits, +2 carotid impulses, no scleral icterus CAR: RRR Irregular RR*** no murmurs***, gallops, rubs, or thrills RES:  Clear to auscultation bilaterally ABD:  Soft, nontender, nondistended, positive bowel sounds x 4 VASC:  +2 radial pulses, +2 carotid pulses NEURO:  CN 2-12 grossly intact; motor and sensory grossly intact PSYCH:  No active depression or anxiety EXT:  No edema, ecchymosis, or cyanosis  Signed, Haislee Corso K Caliber Landess, MD  01/19/2024 11:10 AM    [facility] [address] [place], [place], [place]  [address] Phone: [phone]; Fax: [phone]   Note:  This document was prepared using Dragon voice recognition software and may include unintentional dictation errors.

## 2024-01-20 ENCOUNTER — Ambulatory Visit: Admitting: Internal Medicine

## 2024-01-20 DIAGNOSIS — I10 Essential (primary) hypertension: Secondary | ICD-10-CM

## 2024-01-20 DIAGNOSIS — E785 Hyperlipidemia, unspecified: Secondary | ICD-10-CM

## 2024-01-20 DIAGNOSIS — I700 Atherosclerosis of aorta: Secondary | ICD-10-CM

## 2024-01-20 DIAGNOSIS — K559 Vascular disorder of intestine, unspecified: Secondary | ICD-10-CM

## 2024-01-20 DIAGNOSIS — Z01810 Encounter for preprocedural cardiovascular examination: Secondary | ICD-10-CM

## 2024-01-20 DIAGNOSIS — I739 Peripheral vascular disease, unspecified: Secondary | ICD-10-CM

## 2024-01-20 DIAGNOSIS — I2510 Atherosclerotic heart disease of native coronary artery without angina pectoris: Secondary | ICD-10-CM

## 2024-01-20 DIAGNOSIS — N182 Chronic kidney disease, stage 2 (mild): Secondary | ICD-10-CM

## 2024-01-23 ENCOUNTER — Ambulatory Visit (INDEPENDENT_AMBULATORY_CARE_PROVIDER_SITE_OTHER): Admitting: Podiatry

## 2024-01-23 ENCOUNTER — Encounter (INDEPENDENT_AMBULATORY_CARE_PROVIDER_SITE_OTHER): Payer: Self-pay

## 2024-01-23 DIAGNOSIS — Z91199 Patient's noncompliance with other medical treatment and regimen due to unspecified reason: Secondary | ICD-10-CM

## 2024-01-23 NOTE — Progress Notes (Signed)
 No show

## 2024-01-24 ENCOUNTER — Other Ambulatory Visit: Payer: Self-pay

## 2024-01-24 ENCOUNTER — Ambulatory Visit: Attending: Primary Care | Admitting: Physical Therapy

## 2024-01-24 DIAGNOSIS — M79604 Pain in right leg: Secondary | ICD-10-CM | POA: Insufficient documentation

## 2024-01-24 DIAGNOSIS — R2689 Other abnormalities of gait and mobility: Secondary | ICD-10-CM | POA: Insufficient documentation

## 2024-01-24 DIAGNOSIS — M79605 Pain in left leg: Secondary | ICD-10-CM | POA: Insufficient documentation

## 2024-01-30 ENCOUNTER — Other Ambulatory Visit (HOSPITAL_COMMUNITY): Payer: Self-pay

## 2024-01-30 ENCOUNTER — Encounter: Payer: Self-pay | Admitting: Podiatry

## 2024-01-30 ENCOUNTER — Ambulatory Visit (INDEPENDENT_AMBULATORY_CARE_PROVIDER_SITE_OTHER): Admitting: Podiatry

## 2024-01-30 ENCOUNTER — Ambulatory Visit (INDEPENDENT_AMBULATORY_CARE_PROVIDER_SITE_OTHER)

## 2024-01-30 ENCOUNTER — Other Ambulatory Visit: Payer: Self-pay

## 2024-01-30 DIAGNOSIS — L97512 Non-pressure chronic ulcer of other part of right foot with fat layer exposed: Secondary | ICD-10-CM

## 2024-01-30 DIAGNOSIS — M7751 Other enthesopathy of right foot: Secondary | ICD-10-CM

## 2024-01-30 DIAGNOSIS — I739 Peripheral vascular disease, unspecified: Secondary | ICD-10-CM

## 2024-01-30 MED ORDER — CEPHALEXIN 500 MG PO CAPS
500.0000 mg | ORAL_CAPSULE | Freq: Three times a day (TID) | ORAL | 2 refills | Status: DC
  Filled 2024-01-30 (×4): qty 30, 10d supply, fill #0
  Filled 2024-02-08: qty 30, 10d supply, fill #1
  Filled 2024-02-20: qty 30, 10d supply, fill #2
  Filled 2024-02-20: qty 30, 10d supply, fill #0
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
## 2024-01-31 ENCOUNTER — Ambulatory Visit: Admitting: Cardiovascular Disease

## 2024-01-31 ENCOUNTER — Other Ambulatory Visit: Payer: Self-pay

## 2024-02-01 ENCOUNTER — Telehealth: Payer: Self-pay

## 2024-02-01 ENCOUNTER — Other Ambulatory Visit (HOSPITAL_COMMUNITY): Payer: Self-pay

## 2024-02-01 ENCOUNTER — Ambulatory Visit: Attending: Cardiovascular Disease | Admitting: Cardiovascular Disease

## 2024-02-01 ENCOUNTER — Encounter: Payer: Self-pay | Admitting: Cardiovascular Disease

## 2024-02-01 ENCOUNTER — Telehealth: Payer: Self-pay | Admitting: Pharmacist

## 2024-02-01 VITALS — BP 136/74 | HR 93 | Ht 66.0 in | Wt 126.4 lb

## 2024-02-01 DIAGNOSIS — R931 Abnormal findings on diagnostic imaging of heart and coronary circulation: Secondary | ICD-10-CM | POA: Diagnosis not present

## 2024-02-01 DIAGNOSIS — Z01818 Encounter for other preprocedural examination: Secondary | ICD-10-CM | POA: Diagnosis not present

## 2024-02-01 DIAGNOSIS — E782 Mixed hyperlipidemia: Secondary | ICD-10-CM | POA: Diagnosis not present

## 2024-02-01 DIAGNOSIS — E785 Hyperlipidemia, unspecified: Secondary | ICD-10-CM | POA: Insufficient documentation

## 2024-02-01 DIAGNOSIS — I10 Essential (primary) hypertension: Secondary | ICD-10-CM

## 2024-02-01 DIAGNOSIS — Z720 Tobacco use: Secondary | ICD-10-CM | POA: Diagnosis not present

## 2024-02-01 DIAGNOSIS — I739 Peripheral vascular disease, unspecified: Secondary | ICD-10-CM | POA: Diagnosis not present

## 2024-02-01 NOTE — Assessment & Plan Note (Signed)
 Coronary calcium  score performed 03/22/2022 was 1179 distributed primarily in the LAD but also in the circumflex and RCA.  She is completely asymptomatic.  I was going to get stress test on her 2 years ago which unfortunately never occurred.  She apparently needs a colonoscopy.  I am going to get a Lexiscan Myoview stress test to risk stratify her for preoperative clearance.

## 2024-02-01 NOTE — Assessment & Plan Note (Signed)
 History of mesenteric and iliac stenting by Drs. Vikki Graves  and Nitro who follows noninvasively.

## 2024-02-01 NOTE — Telephone Encounter (Signed)
 Lipid consultation was requested.  LDL 161 TG 162, TC 227, HDL 36 -12/2023  Crestor  dose was increased from 10 mg daily to 40 mg daily.  Risk factors includes tobacco smoking hx, mesenteric ischemia, PAD, CAD<,elevated CAC score, hypertension, HLD  Discussed Reviewed options for lowering LDL cholesterol, including ezetimibe, PCSK-9 .  Discussed mechanisms of action, dosing, side effects and potential decreases in LDL cholesterol.  Also reviewed cost information and potential options for patient assistance.   Given LDLc level reasonable to consider addition of PCSK9i. Will assess coverage for the same.

## 2024-02-01 NOTE — Assessment & Plan Note (Signed)
 History of essential hypertension blood pressure measured today at 136/74.  She is on amlodipine  and hydrochlorothiazide .

## 2024-02-01 NOTE — Assessment & Plan Note (Signed)
 Discontinue tobacco abuse when I saw her 2 years ago.

## 2024-02-01 NOTE — Assessment & Plan Note (Signed)
 History of hyperlipidemia on high-dose rosuvastatin  with lipid profile performed/20/25 revealing total cholesterol 227, LDL 161 and HDL of 36.  She is not goal for secondary prevention given her significantly elevated coronary calcium  score.  I am going to explore PCSK9.  LDL goal less than 70.

## 2024-02-01 NOTE — Telephone Encounter (Signed)
 Pharmacy Patient Advocate Encounter   Received notification from Physician's Office that prior authorization for REPATHA is required/requested.   Insurance verification completed.   The patient is insured through [facility] .   Per test claim: PA required; PA submitted to above mentioned insurance via CoverMyMeds Key/confirmation #/EOC [id number] Status is pending

## 2024-02-01 NOTE — Patient Instructions (Signed)
 Medication Instructions:  Your physician recommends that you continue on your current medications as directed. Please refer to the Current Medication list given to you today.  *If you need a refill on your cardiac medications before your next appointment, please call your pharmacy*  Testing/Procedures: Dr. Katheryne Pane has ordered a Lexiscan Myocardial Perfusion Imaging Study.  Please arrive 15 minutes prior to your appointment time for registration and insurance purposes.   The test will take approximately 3 to 4 hours to complete; you may bring reading material.  If someone comes with you to your appointment, they will need to remain in the main lobby due to limited space in the testing area. **If you are pregnant or breastfeeding, please notify the nuclear lab prior to your appointment**   How to prepare for your Myocardial Perfusion Test: Do not eat or drink 3 hours prior to your test, except you may have water. Do not consume products containing caffeine (regular or decaffeinated) 12 hours prior to your test. (ex: coffee, chocolate, sodas, tea). Do wear comfortable clothes (no dresses or overalls) and walking shoes, tennis shoes preferred (No heels or open toe shoes are allowed). Do NOT wear cologne, perfume, aftershave, or lotions (deodorant is allowed). If you use an inhaler, use it the AM of your test and bring it with you.  If you use a nebulizer, use it the AM of your test.  If these instructions are not followed, your test will have to be rescheduled.   Follow-Up: At [facility], you and your health needs are our priority.  As part of our continuing mission to provide you with exceptional heart care, our providers are all part of one team.  This team includes your primary Cardiologist (physician) and Advanced Practice Providers or APPs (Physician Assistants and Nurse Practitioners) who all work together to provide you with the care you need, when you need it.  Your next  appointment:   12 month(s)  Provider:   Lauro Portal, MD   We recommend signing up for the patient portal called "MyChart".  Sign up information is provided on this After Visit Summary.  MyChart is used to connect with patients for Virtual Visits (Telemedicine).  Patients are able to view lab/test results, encounter notes, upcoming appointments, etc.  Non-urgent messages can be sent to your provider as well.   To learn more about what you can do with MyChart, go to [email or web address].

## 2024-02-01 NOTE — Progress Notes (Signed)
 02/01/2024 Danielle Morton   July 20, 1956  [id number]  Primary Physician Danielle Morton Primary Cardiologist: Danielle Leigh MD Danielle Morton, [place]  HPI:  Danielle Morton is a 68 y.o.  thin-appearing single African-American female mother of 1 daughter, grandmother to 5 grandchildren who is retired from doing dry-cleaning. She was referred by Danielle Schick, Morton for cardiovascular evaluation.  I last saw her in the office 06/09/2022.  Risk factors include recently discontinued tobacco abuse having smoked 20 pack years and quit several months ago. She has treated hypertension. There is no family history for heart disease. She is never had a heart attack or stroke. She denies chest pain or shortness of breath. She did have left lower extremity intervention by Dr. Vikki Morton 08/14/2018 with left common femoral endarterectomy with retrograde left iliac stenting. She had mesenteric intervention by Dr. Christia Morton in the setting of mesenteric ischemia 12/28/21. She currently denies abdominal pain chest pain or shortness of breath. She still however complains of some claudication.  I did obtain a coronary calcium  score on 03/22/2022 which was 1179.  She was asymptomatic at the time.  I was anticipating getting a Lexiscan scan Myoview due to the determine if she had physiologically significant disease but this never occurred.  Since I saw her 2 years ago she is remained asymptomatic and apparently needs a colonoscopy and cardiology clearance for this.  She did stop smoking 2 years ago.   Current Meds  Medication Sig   amLODipine  (NORVASC ) 5 MG tablet Take 1 tablet (5 mg total) by mouth daily.   cephALEXin (KEFLEX) 500 MG capsule Take 1 capsule (500 mg total) by mouth 3 (three) times daily.   hydrochlorothiazide  (HYDRODIURIL ) 25 MG tablet Take 1 tablet (25 mg total) by mouth daily.   rosuvastatin  (CRESTOR ) 40 MG tablet Take 1 tablet (40 mg total) by mouth daily.     No Known Allergies  Social History    Socioeconomic History   Marital status: Single    Spouse name: Not on file   Number of children: Not on file   Years of education: Not on file   Highest education level: Not on file  Occupational History   Not on file  Tobacco Use   Smoking status: Former    Current packs/day: 1.00    Average packs/day: 1 pack/day for 30.0 years (30.0 ttl pk-yrs)    Types: Cigarettes   Smokeless tobacco: Never  Vaping Use   Vaping status: Never Used  Substance and Sexual Activity   Alcohol use: Not Currently    Alcohol/week: 3.0 standard drinks of alcohol    Types: 3 Cans of beer per week   Drug use: No   Sexual activity: Not on file  Other Topics Concern   Not on file  Social History Narrative   Not on file   Social Drivers of Health   Financial Resource Strain: Not on file  Food Insecurity: No Food Insecurity (01/12/2024)   Hunger Vital Sign    Worried About Running Out of Food in the Last Year: Never true    Ran Out of Food in the Last Year: Never true  Transportation Needs: Unmet Transportation Needs (04/15/2022)   PRAPARE - Administrator, Civil Service (Medical): Yes    Lack of Transportation (Non-Medical): Yes  Physical Activity: Not on file  Stress: Not on file  Social Connections: Unknown (02/02/2022)   [facility] from [facility], [facility]   Social Network  Social Network: Not on file  Intimate Partner Violence: Patient Declined (01/12/2024)   Humiliation, Afraid, Rape, and Kick questionnaire    Fear of Current or Ex-Partner: Patient declined    Emotionally Abused: Patient declined    Physically Abused: Patient declined    Sexually Abused: Patient declined     Review of Systems: General: negative for chills, fever, night sweats or weight changes.  Cardiovascular: negative for chest pain, dyspnea on exertion, edema, orthopnea, palpitations, paroxysmal nocturnal dyspnea or shortness of breath Dermatological: negative for rash Respiratory: negative for  cough or wheezing Urologic: negative for hematuria Abdominal: negative for nausea, vomiting, diarrhea, bright red blood per rectum, melena, or hematemesis Neurologic: negative for visual changes, syncope, or dizziness All other systems reviewed and are otherwise negative except as noted above.    Blood pressure 136/74, pulse 93, height 5\' 6"  (1.676 m), weight 126 lb 6.4 oz (57.3 kg), SpO2 96%.  General appearance: alert and no distress Neck: no adenopathy, no JVD, supple, symmetrical, trachea midline, thyroid not enlarged, symmetric, no tenderness/mass/nodules, and bilateral carotid bruits Lungs: clear to auscultation bilaterally Heart: regular rate and rhythm, S1, S2 normal, no murmur, click, rub or gallop Extremities: extremities normal, atraumatic, no cyanosis or edema Pulses: 2+ and symmetric Skin: Skin color, texture, turgor normal. No rashes or lesions Neurologic: Grossly normal  EKG EKG Interpretation Date/Time:  Wednesday Feb 01 2024 13:41:32 EDT Ventricular Rate:  84 PR Interval:  144 QRS Duration:  86 QT Interval:  382 QTC Calculation: 451 R Axis:   7  Text Interpretation: Sinus rhythm with marked sinus arrhythmia Minimal voltage criteria for LVH, may be normal variant ( Sokolow-Lyon ) Nonspecific T wave abnormality When compared with ECG of 26-Dec-2021 17:30, PREVIOUS ECG IS PRESENT Confirmed by Danielle Morton [phone]) on 02/01/2024 1:44:23 PM    ASSESSMENT AND PLAN:   Peripheral arterial disease (HCC) History of mesenteric and iliac stenting by Drs. Danielle Morton  and Danielle Morton who follows noninvasively.  HTN (hypertension) History of essential hypertension blood pressure measured today at 136/74.  She is on amlodipine  and hydrochlorothiazide .  Tobacco abuse Discontinue tobacco abuse when I saw her 2 years ago.  Hyperlipidemia History of hyperlipidemia on high-dose rosuvastatin  with lipid profile performed/20/25 revealing total cholesterol 227, LDL 161 and HDL of 36.  She is  not goal for secondary prevention given her significantly elevated coronary calcium  score.  I am going to explore PCSK9.  LDL goal less than 70.  Elevated coronary artery calcium  score Coronary calcium  score performed 03/22/2022 was 1179 distributed primarily in the LAD but also in the circumflex and RCA.  She is completely asymptomatic.  I was going to get stress test on her 2 years ago which unfortunately never occurred.  She apparently needs a colonoscopy.  I am going to get a Lexiscan Myoview stress test to risk stratify her for preoperative clearance.     Danielle Leigh MD FACP,FACC,FAHA, [facility] 02/01/2024 1:54 PM

## 2024-02-01 NOTE — Telephone Encounter (Signed)
 PA request has been Submitted. New Encounter has been or will be created for follow up. For additional info see Pharmacy Prior Auth telephone encounter from 02/01/24.

## 2024-02-02 ENCOUNTER — Other Ambulatory Visit: Payer: Self-pay

## 2024-02-02 ENCOUNTER — Other Ambulatory Visit (HOSPITAL_COMMUNITY): Payer: Self-pay

## 2024-02-02 NOTE — Telephone Encounter (Signed)
 Pharmacy Patient Advocate Encounter  Received notification from [employer] that Prior Authorization for REPATHA has been APPROVED from 02/01/24 to 08/03/24. Ran test claim, Copay is $0. This test claim was processed through [facility]- copay amounts may vary at other pharmacies due to pharmacy/plan contracts, or as the patient moves through the different stages of their insurance plan.

## 2024-02-03 ENCOUNTER — Other Ambulatory Visit: Payer: Self-pay

## 2024-02-03 ENCOUNTER — Other Ambulatory Visit (HOSPITAL_COMMUNITY): Payer: Self-pay

## 2024-02-03 MED ORDER — REPATHA SURECLICK 140 MG/ML ~~LOC~~ SOAJ
140.0000 mg | SUBCUTANEOUS | 3 refills | Status: AC
  Filled 2024-02-03 (×3): qty 6, 84d supply, fill #0
  Filled 2024-04-25: qty 6, 84d supply, fill #1
  Filled 2024-07-30: qty 6, 84d supply, fill #0

## 2024-02-03 NOTE — Telephone Encounter (Signed)
 Call to inform patient about approval. N/A per DPR LVM.

## 2024-02-03 NOTE — Addendum Note (Signed)
 Addended by: Azure Budnick K on: 02/03/2024 08:19 AM   Modules accepted: Orders

## 2024-02-06 ENCOUNTER — Other Ambulatory Visit: Payer: Self-pay

## 2024-02-06 NOTE — Therapy (Signed)
 OUTPATIENT PHYSICAL THERAPY LOWER EXTREMITY EVALUATION   Patient Name: Danielle Morton MRN: [id number] DOB:03-27-1956, 68 y.o., female Today's Date: 02/08/2024  END OF SESSION:  PT End of Session - 02/08/24 1009     Visit Number 1    Number of Visits 4    Date for PT Re-Evaluation 04/09/24    Authorization Type [employer] MCR    Progress Note Due on Visit 10    PT Start Time 1030    PT Stop Time 1115    PT Time Calculation (min) 45 min    Activity Tolerance Patient tolerated treatment well    Behavior During Therapy WFL for tasks assessed/performed             Past Medical History:  Diagnosis Date   Hypertension    PVD (peripheral vascular disease) (HCC)    Past Surgical History:  Procedure Laterality Date   ANGIOPLASTY N/A 12/28/2021   Procedure: BALLOON ANGIOPLASTY OF SUPERIOR MESENTERIC ARTERY;  Surgeon: Kayla Part, MD;  Location: [facility] OR;  Service: Vascular;  Laterality: N/A;   ENDARTERECTOMY FEMORAL Left 08/15/2018   Procedure: ENDARTERECTOMY COMMON FEMORAL;  Surgeon: Adine Hoof, MD;  Location: [facility] OR;  Service: Vascular;  Laterality: Left;   INSERTION OF ILIAC STENT Left 08/15/2018   Procedure: INSERTION OF ILIAC STENT RETROGRADE;  Surgeon: Adine Hoof, MD;  Location: [facility] OR;  Service: Vascular;  Laterality: Left;   LOWER EXTREMITY ANGIOGRAPHY N/A 08/14/2018   Procedure: LOWER EXTREMITY ANGIOGRAPHY;  Surgeon: Adine Hoof, MD;  Location: [facility] INVASIVE CV LAB;  Service: Cardiovascular;  Laterality: N/A;   MESENTERIC ARTERY BYPASS Left 12/28/2021   Procedure: MESENTERIC ANGIOGRAPHY FROM LEFT BRACHIAL ARTERY;  Surgeon: Kayla Part, MD;  Location: [facility] OR;  Service: Vascular;  Laterality: Left;   Patient Active Problem List   Diagnosis Date Noted   Hyperlipidemia 02/01/2024   Elevated coronary artery calcium  score 02/01/2024   Tobacco abuse 03/16/2022   Mesenteric ischemia (HCC) 12/26/2021   HTN (hypertension) 12/26/2021    Leukocytosis 12/26/2021   Peripheral arterial disease (HCC) 08/14/2018    PCP: Marius Siemens, NP  REFERRING PROVIDER: Marius Siemens, NP  REFERRING DIAG: [phone] (ICD-10-CM) - Pain in both lower legs R26.9 (ICD-10-CM) - Abnormality of gait  THERAPY DIAG:  Pain in left leg  Pain in right leg  Other abnormalities of gait and mobility  Rationale for Evaluation and Treatment: Rehabilitation  ONSET DATE: chronic  SUBJECTIVE:   SUBJECTIVE STATEMENT: Patient relates a history of B leg localized to B calves.  Symptoms worse with prolonged standing tasks and relieved with sitting.  Symptoms onset following 10 min of activity.  PERTINENT HISTORY: None availaable PAIN:  Are you having pain? Yes: NPRS scale: 8/10 Pain location: B calves Pain description: sharp Aggravating factors: prolonged standing Relieving factors: sitting  PRECAUTIONS: None  RED FLAGS: None   WEIGHT BEARING RESTRICTIONS: No  FALLS:  Has patient fallen in last 6 months? No  OCCUPATION: not working  PLOF: Independent  PATIENT GOALS: To relieve my pain symptoms  NEXT MD VISIT: TBD  OBJECTIVE:  Note: Objective measures were completed at Evaluation unless otherwise noted.  DIAGNOSTIC FINDINGS: none  PATIENT SURVEYS:  LEFS 48/80 60% perceived function  MUSCLE LENGTH: Tight calves  POSTURE: No Significant postural limitations  PALPATION: deferred  LOWER EXTREMITY ROM:  Active ROM Right eval Left eval  Hip flexion    Hip extension    Hip abduction    Hip adduction  Hip internal rotation    Hip external rotation    Knee flexion    Knee extension    Ankle dorsiflexion 5 5  Ankle plantarflexion    Ankle inversion    Ankle eversion     (Blank rows = not tested)  LOWER EXTREMITY MMT:  MMT Right eval Left eval  Hip flexion 4 4  Hip extension 4 4  Hip abduction 4 4  Hip adduction    Hip internal rotation    Hip external rotation    Knee flexion 4 4   Knee extension 4 4  Ankle dorsiflexion    Ankle plantarflexion 3 3  Ankle inversion    Ankle eversion     (Blank rows = not tested)  FUNCTIONAL TESTS:  30 seconds chair stand test 10 reps 2 MWT 285  GAIT: Distance walked: 46ft x2 Assistive device utilized: Single point cane Level of assistance: Complete Independence Comments: slow cadence, antalgic                                                                                                                                 TREATMENT DATE:  [facility] Adult PT Treatment:                                                DATE: 02/08/24 Eval and HEP Self Care: Additional minutes spent for educating on updated Therapeutic Home Exercise Program as well as comparing current status to condition at start of symptoms. This included exercises focusing on stretching, strengthening, with focus on eccentric aspects. Long term goals include an improvement in range of motion, strength, endurance as well as avoiding reinjury. Patient's frequency would include in 1-2 times a day, 3-5 times a week for a duration of 6-12 weeks. Proper technique shown and discussed handout in great detail. All questions were discussed and addressed.      PATIENT EDUCATION:  Education details: Discussed eval findings, rehab rationale and POC and patient is in agreement  Person educated: Patient Education method: Explanation Education comprehension: verbalized understanding and needs further education  HOME EXERCISE PROGRAM: Access Code: [email or web address] URL: [email or web address] Date: 02/08/2024 Prepared by: Gretta Leavens  Exercises - Sit to Stand with Armchair  - 1-2 x daily - 3 x weekly - 1 sets - 5 reps - Seated Heel Raise  - 1-2 x daily - 3 x weekly - 2 sets - 10 reps - Long Sitting Plantar Fascia Stretch with Towel  - 1-2 x daily - 3 x weekly - 1 sets - 2 reps - 30s hold - Seated Toe Raise  - 1-2 x daily - 3 x weekly - 1 sets - 10 reps - Heel Toe Raises  with Counter Support  - 1-2 x daily - 3 x weekly - 1 sets - 10 reps  ASSESSMENT:  CLINICAL IMPRESSION: Patient is a 68 y.o. female who was seen today for physical therapy evaluation and treatment for B calf pain due to soft tissue restrictions in gastroc/soleus complex as well as pain from suspected claudication due to known circulation. 30s chair stand test finds mild strength deficits and endurance limitations identified on 2 MWT.  LEFS finds patient functioning at 60%  OBJECTIVE IMPAIRMENTS: Abnormal gait, decreased activity tolerance, decreased endurance, decreased knowledge of condition, decreased mobility, difficulty walking, decreased strength, and pain.   ACTIVITY LIMITATIONS: standing, squatting, and prolonged standing  PERSONAL FACTORS: Fitness, Past/current experiences, Time since onset of injury/illness/exacerbation, and 1 comorbidity: claudication are also affecting patient's functional outcome.   REHAB POTENTIAL: Good  CLINICAL DECISION MAKING: Evolving/moderate complexity  EVALUATION COMPLEXITY: Low   GOALS: Goals reviewed with patient? No  SHORT TERM GOALS=LONG TERM GOALS: Target date: 03/21/2024    Patient will score at least 58/80 on LEFS to signify clinically meaningful improvement in functional abilities.   Baseline: 48/80 Goal status: INITIAL  2.  Patient will acknowledge 6/10 pain at least once during episode of care   Baseline: 8/10 Goal status: INITIAL  3.  Patient will increase 30s chair stand reps from 10 to 12 without arms to demonstrate and improved functional ability with less pain/difficulty as well as reduce fall risk.  Baseline: 10 Goal status: INITIAL  4.  Patient to demonstrate independence in HEP  Baseline: [id number] Goal status: INITIAL  5.  Increase distance on 2 MWT  Baseline: 248ft with cane  Goal status: INITIAL  6.  10d B DF AROM Baseline: 5d B DF AROM Goal status: INITIAL   PLAN:  PT FREQUENCY: 1x/week  PT DURATION: 6  weeks  PLANNED INTERVENTIONS: 97110-Therapeutic exercises, 97530- Therapeutic activity, 97112- Neuromuscular re-education, 97535- Self Care, and 16109- Manual therapy  PLAN FOR NEXT SESSION: HEP review and update, manual techniques as appropriate, aerobic tasks, ROM and flexibility activities, strengthening and PREs, TPDN, gait and balance training as needed    Date of referral: 01/12/24 Referring provider: Marius Siemens, NP Referring diagnosis? [phone] (ICD-10-CM) - Pain in both lower legs R26.9 (ICD-10-CM) - Abnormality of gait Treatment diagnosis? (if different than referring diagnosis) Pain in both lower legs   What was this (referring dx) caused by? Ongoing Issue  Lonne Roan of Condition: Chronic (continuous duration > 3 months)   Laterality: Both  Current Functional Measure Score: LEFS 48/80   Objective measurements identify impairments when they are compared to normal values, the uninvolved extremity, and prior level of function.  [x]  Yes  []  No  Objective assessment of functional ability: Moderate functional limitations   Briefly describe symptoms: B calf pain with prolonged standing  How did symptoms start: progressive over time  Average pain intensity:  Last 24 hours: 8/10  Past week: 8/10  How often does the pt experience symptoms? Intermittently  How much have the symptoms interfered with usual daily activities? Quite a bit  How has condition changed since care began at this facility? NA - initial visit  In general, how is the patients overall health? Good   BACK PAIN (STarT Back Screening Tool) No   Eldon Greenland, PT 02/08/2024, 11:20 AM

## 2024-02-07 ENCOUNTER — Telehealth (INDEPENDENT_AMBULATORY_CARE_PROVIDER_SITE_OTHER): Payer: Self-pay

## 2024-02-07 NOTE — Telephone Encounter (Signed)
 Copied from CRM [id number]. Topic: General - Other >> Feb 07, 2024 12:48 PM Felizardo Hotter wrote: Reason for CRM: Pt received letter and would like to discuss with someone in clinic. Please call pt at [phone].

## 2024-02-07 NOTE — Telephone Encounter (Signed)
 Copied from CRM [id number]. Topic: Clinical - Lab/Test Results >> Feb 07, 2024 12:51 PM Donald Frost wrote: Reason for CRM: The patient called for lab results and I read what the provider stated and she said to tell her provider she is working on her diet and taking the Crestor .

## 2024-02-08 ENCOUNTER — Other Ambulatory Visit (HOSPITAL_COMMUNITY): Payer: Self-pay

## 2024-02-08 ENCOUNTER — Other Ambulatory Visit: Payer: Self-pay

## 2024-02-08 ENCOUNTER — Ambulatory Visit

## 2024-02-08 DIAGNOSIS — M79604 Pain in right leg: Secondary | ICD-10-CM | POA: Diagnosis not present

## 2024-02-08 DIAGNOSIS — R2689 Other abnormalities of gait and mobility: Secondary | ICD-10-CM | POA: Diagnosis not present

## 2024-02-08 DIAGNOSIS — M79605 Pain in left leg: Secondary | ICD-10-CM | POA: Diagnosis not present

## 2024-02-09 ENCOUNTER — Other Ambulatory Visit: Payer: Self-pay

## 2024-02-15 ENCOUNTER — Telehealth (HOSPITAL_COMMUNITY): Payer: Self-pay

## 2024-02-15 ENCOUNTER — Other Ambulatory Visit: Payer: Self-pay | Admitting: Cardiovascular Disease

## 2024-02-15 ENCOUNTER — Ambulatory Visit (INDEPENDENT_AMBULATORY_CARE_PROVIDER_SITE_OTHER): Admitting: Podiatry

## 2024-02-15 ENCOUNTER — Encounter: Payer: Self-pay | Admitting: Podiatry

## 2024-02-15 ENCOUNTER — Telehealth (HOSPITAL_COMMUNITY): Payer: Self-pay | Admitting: *Deleted

## 2024-02-15 ENCOUNTER — Ambulatory Visit (HOSPITAL_COMMUNITY)
Admission: RE | Admit: 2024-02-15 | Discharge: 2024-02-15 | Disposition: A | Discharge status: Home / Self Care | Source: Ambulatory Visit | Attending: Podiatry | Admitting: Podiatry

## 2024-02-15 DIAGNOSIS — R931 Abnormal findings on diagnostic imaging of heart and coronary circulation: Secondary | ICD-10-CM

## 2024-02-15 DIAGNOSIS — I739 Peripheral vascular disease, unspecified: Secondary | ICD-10-CM

## 2024-02-15 DIAGNOSIS — E782 Mixed hyperlipidemia: Secondary | ICD-10-CM

## 2024-02-15 DIAGNOSIS — Z01818 Encounter for other preprocedural examination: Secondary | ICD-10-CM

## 2024-02-15 DIAGNOSIS — L97512 Non-pressure chronic ulcer of other part of right foot with fat layer exposed: Secondary | ICD-10-CM | POA: Diagnosis not present

## 2024-02-15 DIAGNOSIS — I10 Essential (primary) hypertension: Secondary | ICD-10-CM

## 2024-02-15 LAB — VAS US ABI WITH/WO TBI
Left ABI: 0.55
Right ABI: 0

## 2024-02-15 NOTE — Addendum Note (Signed)
 Addended by: Almee Pelphrey R on: 02/15/2024 11:52 AM   Modules accepted: Orders

## 2024-02-15 NOTE — Telephone Encounter (Signed)
 Left message on voicemail per DPR in reference to upcoming appointment scheduled on 02/20/2024 at 8:00 with detailed instructions given per Myocardial Perfusion Study Information Sheet for the test. LM to arrive 15 minutes early, and that it is imperative to arrive on time for appointment to keep from having the test rescheduled. If you need to cancel or reschedule your appointment, please call the office within 24 hours of your appointment. Failure to do so may result in a cancellation of your appointment, and a $50 no show fee. Phone number given for call back for any questions.

## 2024-02-15 NOTE — Telephone Encounter (Signed)
 Attempted to contact the patient to schedule VAS US .  Yes answer.  Answered, not scheduled.  Second Attempt. Did not provide direct contact number for scheduling: [phone].   Patient requested appointment be scheduled for her, and information be sent to [facility] as she is on her way to an appointment with them now.

## 2024-02-15 NOTE — Progress Notes (Signed)
 Called by vascular ultrasound on patient this morning after procedure and patient with severely diminished flow in right leg. Will send urgent referral to vascular to be evaluated. Patient advised if any worsening in pain or wound to present to emergency room.

## 2024-02-15 NOTE — Progress Notes (Signed)
  Subjective:  Patient ID: Danielle Morton, female    DOB: Feb 17, 1956,   MRN: [id number]  Chief Complaint  Patient presents with   Wound Check    2 week wound check  Pt stated that she has a burning sensation     68 y.o. female presents for follow-up of wound on right foot. Relates doing ok and does seem to be doing better however has continued to get pain in the top of her foot and around the wound site. Has finished antibiotics. Denies any other pedal complaints. Denies n/v/f/c.   Past Medical History:  Diagnosis Date   Hypertension    PVD (peripheral vascular disease) (HCC)     Objective:  Physical Exam: Vascular: DP/PT pulses non palpable bilateral. CFT <5 seconds. Absent hair growth on digits. Edema noted to bilateral lower extremities. Xerosis noted bilaterally.  Skin. No lacerations or abrasions bilateral feet. Nails 1-5 bilateral  are thickened discolored and elongated with subungual debris. Hyperkeratotic lesion noted lateral fifth digit upon debridement open ulceration noted with mild erythema surrounding and edema.  Musculoskeletal: MMT 5/5 bilateral lower extremities in DF, PF, Inversion and Eversion. Deceased ROM in DF of ankle joint.  Neurological: Sensation intact to light touch. Protective sensation diminished bilateral.     Assessment:   1. 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 Peripheral arterial disease (HCC)      Plan:  Ulcer lateral fifth metatarsal head with fat layer exposed  -X-rays reviewed no osseous erosions noted. Edema noted around ulceration site  -Minimal debridement as below. No deep debridement concern for circulation.  -Dressed with betadine, DSD. -Off-loading with surgical shoe.  -No additional antibiotics ordered today.  -ABIs ordered. Has appointment today at noon.  -Discussed glucose control and proper protein-rich diet.  -Discussed if any worsening redness, pain, fever or chills to call or may need to report to the  emergency room. Patient expressed understanding.   Procedure: Excisional Debridement of Wound Rationale: Removal of non-viable soft tissue from the wound to promote healing.  Anesthesia: none Pre-Debridement Wound Measurements: Overlying callus  Post-Debridement Wound Measurements: 0.2 cm x 0.2 cm x 0.3 cm  Type of Debridement: Sharp Excisional Tissue Removed: Non-viable soft tissue Depth of Debridement: subcutaneous tissue. Technique: Sharp excisional debridement to bleeding, viable wound base.  Dressing: Dry, sterile, compression dressing. Disposition: Patient tolerated procedure well. Patient to return in 2 week for follow-up.  No follow-ups on file.    Jennefer Moats, DPM

## 2024-02-17 ENCOUNTER — Ambulatory Visit
Admission: RE | Admit: 2024-02-17 | Discharge: 2024-02-17 | Disposition: A | Discharge status: Home / Self Care | Source: Ambulatory Visit | Attending: Primary Care | Admitting: Primary Care

## 2024-02-17 DIAGNOSIS — Z1231 Encounter for screening mammogram for malignant neoplasm of breast: Secondary | ICD-10-CM | POA: Diagnosis not present

## 2024-02-20 ENCOUNTER — Other Ambulatory Visit: Payer: Self-pay

## 2024-02-20 ENCOUNTER — Ambulatory Visit (HOSPITAL_COMMUNITY)
Admission: RE | Admit: 2024-02-20 | Discharge: 2024-02-20 | Disposition: A | Discharge status: Home / Self Care | Source: Ambulatory Visit | Attending: Cardiovascular Disease | Admitting: Cardiovascular Disease

## 2024-02-20 ENCOUNTER — Other Ambulatory Visit (HOSPITAL_COMMUNITY): Payer: Self-pay

## 2024-02-20 DIAGNOSIS — Z01818 Encounter for other preprocedural examination: Secondary | ICD-10-CM | POA: Insufficient documentation

## 2024-02-20 DIAGNOSIS — I700 Atherosclerosis of aorta: Secondary | ICD-10-CM | POA: Insufficient documentation

## 2024-02-20 DIAGNOSIS — E782 Mixed hyperlipidemia: Secondary | ICD-10-CM | POA: Diagnosis not present

## 2024-02-20 DIAGNOSIS — I2510 Atherosclerotic heart disease of native coronary artery without angina pectoris: Secondary | ICD-10-CM | POA: Insufficient documentation

## 2024-02-20 DIAGNOSIS — J984 Other disorders of lung: Secondary | ICD-10-CM | POA: Insufficient documentation

## 2024-02-20 DIAGNOSIS — I10 Essential (primary) hypertension: Secondary | ICD-10-CM | POA: Insufficient documentation

## 2024-02-20 DIAGNOSIS — Z01810 Encounter for preprocedural cardiovascular examination: Secondary | ICD-10-CM | POA: Diagnosis not present

## 2024-02-20 DIAGNOSIS — R931 Abnormal findings on diagnostic imaging of heart and coronary circulation: Secondary | ICD-10-CM | POA: Insufficient documentation

## 2024-02-20 LAB — MYOCARDIAL PERFUSION IMAGING
Base ST Depression (mm): 0 mm
LV dias vol: 66 mL (ref 46–106)
LV sys vol: 35 mL
Nuc Stress EF: 47 %
Rest Nuclear Isotope Dose: 10.7 mCi
SDS: 0
SRS: 3
SSS: 0
ST Depression (mm): 0 mm
Stress Nuclear Isotope Dose: 31.6 mCi
TID: 1.04

## 2024-02-20 MED ORDER — REGADENOSON 0.4 MG/5ML IV SOLN
INTRAVENOUS | Status: AC
  Filled 2024-02-20: qty 5

## 2024-02-20 MED ORDER — TECHNETIUM TC 99M TETROFOSMIN IV KIT
31.6000 | PACK | Freq: Once | INTRAVENOUS | Status: AC | PRN
Start: 2024-02-20 — End: 2024-02-20
  Administered 2024-02-20: 31.6 via INTRAVENOUS

## 2024-02-20 MED ORDER — TECHNETIUM TC 99M TETROFOSMIN IV KIT
10.7000 | PACK | Freq: Once | INTRAVENOUS | Status: AC | PRN
  Administered 2024-02-20: 10.7 via INTRAVENOUS

## 2024-02-20 MED ORDER — REGADENOSON 0.4 MG/5ML IV SOLN
0.4000 mg | Freq: Once | INTRAVENOUS | Status: AC
  Administered 2024-02-20: 0.4 mg via INTRAVENOUS

## 2024-02-21 ENCOUNTER — Other Ambulatory Visit: Payer: Self-pay

## 2024-02-23 ENCOUNTER — Ambulatory Visit: Payer: Self-pay | Admitting: Cardiovascular Disease

## 2024-02-23 ENCOUNTER — Other Ambulatory Visit: Payer: Self-pay | Admitting: Primary Care

## 2024-02-23 ENCOUNTER — Ambulatory Visit: Admitting: Physical Therapy

## 2024-02-23 ENCOUNTER — Telehealth: Payer: Self-pay | Admitting: Physical Therapy

## 2024-02-23 DIAGNOSIS — R928 Other abnormal and inconclusive findings on diagnostic imaging of breast: Secondary | ICD-10-CM

## 2024-02-23 NOTE — Therapy (Incomplete)
 OUTPATIENT PHYSICAL THERAPY LOWER EXTREMITY EVALUATION   Patient Name: Danielle Morton MRN: [id number] DOB:1956-04-11, 68 y.o., female Today's Date: 02/23/2024  END OF SESSION:    Past Medical History:  Diagnosis Date   Hypertension    PVD (peripheral vascular disease) (HCC)    Past Surgical History:  Procedure Laterality Date   ANGIOPLASTY N/A 12/28/2021   Procedure: BALLOON ANGIOPLASTY OF SUPERIOR MESENTERIC ARTERY;  Surgeon: Kayla Part, MD;  Location: [facility] OR;  Service: Vascular;  Laterality: N/A;   ENDARTERECTOMY FEMORAL Left 08/15/2018   Procedure: ENDARTERECTOMY COMMON FEMORAL;  Surgeon: Adine Hoof, MD;  Location: [facility] OR;  Service: Vascular;  Laterality: Left;   INSERTION OF ILIAC STENT Left 08/15/2018   Procedure: INSERTION OF ILIAC STENT RETROGRADE;  Surgeon: Adine Hoof, MD;  Location: [facility] OR;  Service: Vascular;  Laterality: Left;   LOWER EXTREMITY ANGIOGRAPHY N/A 08/14/2018   Procedure: LOWER EXTREMITY ANGIOGRAPHY;  Surgeon: Adine Hoof, MD;  Location: [facility] INVASIVE CV LAB;  Service: Cardiovascular;  Laterality: N/A;   MESENTERIC ARTERY BYPASS Left 12/28/2021   Procedure: MESENTERIC ANGIOGRAPHY FROM LEFT BRACHIAL ARTERY;  Surgeon: Kayla Part, MD;  Location: [facility] OR;  Service: Vascular;  Laterality: Left;   Patient Active Problem List   Diagnosis Date Noted   Hyperlipidemia 02/01/2024   Elevated coronary artery calcium  score 02/01/2024   Tobacco abuse 03/16/2022   Mesenteric ischemia (HCC) 12/26/2021   HTN (hypertension) 12/26/2021   Leukocytosis 12/26/2021   Peripheral arterial disease (HCC) 08/14/2018    PCP: Marius Siemens, NP  REFERRING PROVIDER: Marius Siemens, NP  REFERRING DIAG: [phone] (ICD-10-CM) - Pain in both lower legs R26.9 (ICD-10-CM) - Abnormality of gait  THERAPY DIAG:  No diagnosis found.  Rationale for Evaluation and Treatment: Rehabilitation  ONSET DATE: chronic  SUBJECTIVE:    SUBJECTIVE STATEMENT: Patient relates a history of B leg localized to B calves.  Symptoms worse with prolonged standing tasks and relieved with sitting.  Symptoms onset following 10 min of activity.  PERTINENT HISTORY: None availaable PAIN:  Are you having pain? Yes: NPRS scale: 8/10 Pain location: B calves Pain description: sharp Aggravating factors: prolonged standing Relieving factors: sitting  PRECAUTIONS: None  RED FLAGS: None   WEIGHT BEARING RESTRICTIONS: No  FALLS:  Has patient fallen in last 6 months? No  OCCUPATION: not working  PLOF: Independent  PATIENT GOALS: To relieve my pain symptoms  NEXT MD VISIT: TBD  OBJECTIVE:  Note: Objective measures were completed at Evaluation unless otherwise noted.  DIAGNOSTIC FINDINGS: none  PATIENT SURVEYS:  LEFS 48/80 60% perceived function  MUSCLE LENGTH: Tight calves  POSTURE: No Significant postural limitations  PALPATION: deferred  LOWER EXTREMITY ROM:  Active ROM Right eval Left eval  Hip flexion    Hip extension    Hip abduction    Hip adduction    Hip internal rotation    Hip external rotation    Knee flexion    Knee extension    Ankle dorsiflexion 5 5  Ankle plantarflexion    Ankle inversion    Ankle eversion     (Blank rows = not tested)  LOWER EXTREMITY MMT:  MMT Right eval Left eval  Hip flexion 4 4  Hip extension 4 4  Hip abduction 4 4  Hip adduction    Hip internal rotation    Hip external rotation    Knee flexion 4 4  Knee extension 4 4  Ankle dorsiflexion    Ankle plantarflexion 3 3  Ankle inversion    Ankle eversion     (Blank rows = not tested)  FUNCTIONAL TESTS:  30 seconds chair stand test 10 reps 2 MWT 285  GAIT: Distance walked: 63ft x2 Assistive device utilized: Single point cane Level of assistance: Complete Independence Comments: slow cadence, antalgic                                                                                                                                  TREATMENT: [facility] Adult PT Treatment:                                                DATE: 02/23/2024  Therapeutic Exercise: *** Manual Therapy: *** Neuromuscular re-ed: *** Therapeutic Activity: Inclined treadmill 5' for tolerance with claudication related symptoms. Modalities: *** Self Care: ***   [facility] Adult PT Treatment:                                                DATE: 02/08/24 Eval and HEP Self Care: Additional minutes spent for educating on updated Therapeutic Home Exercise Program as well as comparing current status to condition at start of symptoms. This included exercises focusing on stretching, strengthening, with focus on eccentric aspects. Long term goals include an improvement in range of motion, strength, endurance as well as avoiding reinjury. Patient's frequency would include in 1-2 times a day, 3-5 times a week for a duration of 6-12 weeks. Proper technique shown and discussed handout in great detail. All questions were discussed and addressed.      PATIENT EDUCATION:  Education details: Discussed eval findings, rehab rationale and POC and patient is in agreement  Person educated: Patient Education method: Explanation Education comprehension: verbalized understanding and needs further education  HOME EXERCISE PROGRAM: Access Code: [email or web address] URL: [email or web address] Date: 02/08/2024 Prepared by: Gretta Leavens  Exercises - Sit to Stand with Armchair  - 1-2 x daily - 3 x weekly - 1 sets - 5 reps - Seated Heel Raise  - 1-2 x daily - 3 x weekly - 2 sets - 10 reps - Long Sitting Plantar Fascia Stretch with Towel  - 1-2 x daily - 3 x weekly - 1 sets - 2 reps - 30s hold - Seated Toe Raise  - 1-2 x daily - 3 x weekly - 1 sets - 10 reps - Heel Toe Raises with Counter Support  - 1-2 x daily - 3 x weekly - 1 sets - 10 reps  ASSESSMENT:  CLINICAL IMPRESSION: Pt attended physical therapy session for continuation of treatment  regarding B calf pain. Today's treatment focused on improvement of  activity tolerance with ambulatory focus, Gastroc complex  motility/strengthening, and standing tolerance. Pt showed  *** tolerance to administered treatment with *** adverse effects by the end of session. Skilled intervention was utilized via activity modification for pt tolerance with task completion, functional progression/regression promoting best outcomes inline with current rehab goals, as well as *** verbal/tactile cuing alongside *** physical assistance for safe and appropriate performance of today's activities. Continue with therapeutic focus on ***.   Eval impression: Patient is a 68 y.o. female who was seen today for physical therapy evaluation and treatment for B calf pain due to soft tissue restrictions in gastroc/soleus complex as well as pain from suspected claudication due to known circulation. 30s chair stand test finds mild strength deficits and endurance limitations identified on 2 MWT.  LEFS finds patient functioning at 60%  OBJECTIVE IMPAIRMENTS: Abnormal gait, decreased activity tolerance, decreased endurance, decreased knowledge of condition, decreased mobility, difficulty walking, decreased strength, and pain.   ACTIVITY LIMITATIONS: standing, squatting, and prolonged standing  PERSONAL FACTORS: Fitness, Past/current experiences, Time since onset of injury/illness/exacerbation, and 1 comorbidity: claudication are also affecting patient's functional outcome.   REHAB POTENTIAL: Good  CLINICAL DECISION MAKING: Evolving/moderate complexity  EVALUATION COMPLEXITY: Low   GOALS: Goals reviewed with patient? No  SHORT TERM GOALS=LONG TERM GOALS: Target date: 03/21/2024    Patient will score at least 58/80 on LEFS to signify clinically meaningful improvement in functional abilities.   Baseline: 48/80 Goal status: INITIAL  2.  Patient will acknowledge 6/10 pain at least once during episode of care   Baseline:  8/10 Goal status: INITIAL  3.  Patient will increase 30s chair stand reps from 10 to 12 without arms to demonstrate and improved functional ability with less pain/difficulty as well as reduce fall risk.  Baseline: 10 Goal status: INITIAL  4.  Patient to demonstrate independence in HEP  Baseline: [id number] Goal status: INITIAL  5.  Increase distance on 2 MWT  Baseline: 264ft with cane  Goal status: INITIAL  6.  10d B DF AROM Baseline: 5d B DF AROM Goal status: INITIAL   PLAN:  PT FREQUENCY: 1x/week  PT DURATION: 6 weeks  PLANNED INTERVENTIONS: 97110-Therapeutic exercises, 97530- Therapeutic activity, 97112- Neuromuscular re-education, 97535- Self Care, and 40981- Manual therapy  PLAN FOR NEXT SESSION: HEP review and update, manual techniques as appropriate, aerobic tasks, ROM and flexibility activities, strengthening and PREs, TPDN, gait and balance training as needed    Date of referral: 01/12/24 Referring provider: Marius Siemens, NP Referring diagnosis? [phone] (ICD-10-CM) - Pain in both lower legs R26.9 (ICD-10-CM) - Abnormality of gait Treatment diagnosis? (if different than referring diagnosis) Pain in both lower legs   What was this (referring dx) caused by? Ongoing Issue  Lonne Roan of Condition: Chronic (continuous duration > 3 months)   Laterality: Both  Current Functional Measure Score: LEFS 48/80   Objective measurements identify impairments when they are compared to normal values, the uninvolved extremity, and prior level of function.  [x]  Yes  []  No  Objective assessment of functional ability: Moderate functional limitations   Briefly describe symptoms: B calf pain with prolonged standing  How did symptoms start: progressive over time  Average pain intensity:  Last 24 hours: 8/10  Past week: 8/10  How often does the pt experience symptoms? Intermittently  How much have the symptoms interfered with usual daily activities? Quite a  bit  How has condition changed since care began at this facility? NA - initial visit  In general, how is the patients overall health? Good  BACK PAIN (STarT Back Screening Tool) No   Albesa Huguenin, PT, DPT 02/23/2024, 10:38 AM

## 2024-02-23 NOTE — Therapy (Unsigned)
 OUTPATIENT PHYSICAL THERAPY TREATMENT NOTE/DISCHARGE   Patient Name: Danielle Morton MRN: [id number] DOB:02-Apr-1956, 68 y.o., female Today's Date: 02/24/2024  END OF SESSION:  PT End of Session - 02/24/24 1007     Visit Number 2    Number of Visits 4    Date for PT Re-Evaluation 04/09/24    Authorization Type [employer] MCR    PT Start Time 1005    PT Stop Time 1035    PT Time Calculation (min) 30 min    Activity Tolerance Patient tolerated treatment well    Behavior During Therapy WFL for tasks assessed/performed              Past Medical History:  Diagnosis Date   Hypertension    PVD (peripheral vascular disease) (HCC)    Past Surgical History:  Procedure Laterality Date   ANGIOPLASTY N/A 12/28/2021   Procedure: BALLOON ANGIOPLASTY OF SUPERIOR MESENTERIC ARTERY;  Surgeon: Kayla Part, MD;  Location: [facility] OR;  Service: Vascular;  Laterality: N/A;   ENDARTERECTOMY FEMORAL Left 08/15/2018   Procedure: ENDARTERECTOMY COMMON FEMORAL;  Surgeon: Adine Hoof, MD;  Location: [facility] OR;  Service: Vascular;  Laterality: Left;   INSERTION OF ILIAC STENT Left 08/15/2018   Procedure: INSERTION OF ILIAC STENT RETROGRADE;  Surgeon: Adine Hoof, MD;  Location: [facility] OR;  Service: Vascular;  Laterality: Left;   LOWER EXTREMITY ANGIOGRAPHY N/A 08/14/2018   Procedure: LOWER EXTREMITY ANGIOGRAPHY;  Surgeon: Adine Hoof, MD;  Location: [facility] INVASIVE CV LAB;  Service: Cardiovascular;  Laterality: N/A;   MESENTERIC ARTERY BYPASS Left 12/28/2021   Procedure: MESENTERIC ANGIOGRAPHY FROM LEFT BRACHIAL ARTERY;  Surgeon: Kayla Part, MD;  Location: [facility] OR;  Service: Vascular;  Laterality: Left;   Patient Active Problem List   Diagnosis Date Noted   Hyperlipidemia 02/01/2024   Elevated coronary artery calcium  score 02/01/2024   Tobacco abuse 03/16/2022   Mesenteric ischemia (HCC) 12/26/2021   HTN (hypertension) 12/26/2021   Leukocytosis 12/26/2021   Peripheral  arterial disease (HCC) 08/14/2018    PCP: Marius Siemens, NP  REFERRING PROVIDER: Marius Siemens, NP  REFERRING DIAG: [phone] (ICD-10-CM) - Pain in both lower legs R26.9 (ICD-10-CM) - Abnormality of gait  THERAPY DIAG:  Pain in left leg  Pain in right leg  Other abnormalities of gait and mobility  Rationale for Evaluation and Treatment: Rehabilitation  ONSET DATE: chronic  SUBJECTIVE:   SUBJECTIVE STATEMENT:  No pain to report.  Feels she can DC PT.  PERTINENT HISTORY: None availaable PAIN:  Are you having pain? Yes: NPRS scale: 8/10 Pain location: B calves Pain description: sharp Aggravating factors: prolonged standing Relieving factors: sitting  PRECAUTIONS: None  RED FLAGS: None   WEIGHT BEARING RESTRICTIONS: No  FALLS:  Has patient fallen in last 6 months? No  OCCUPATION: not working  PLOF: Independent  PATIENT GOALS: To relieve my pain symptoms  NEXT MD VISIT: TBD  OBJECTIVE:  Note: Objective measures were completed at Evaluation unless otherwise noted.  DIAGNOSTIC FINDINGS: none  PATIENT SURVEYS:  LEFS 48/80 60% perceived function 02/24/24 67/80 84% perceived function  MUSCLE LENGTH: Tight calves  POSTURE: No Significant postural limitations  PALPATION: deferred  LOWER EXTREMITY ROM:  Active ROM Right eval Left eval R 02/24/24 L 02/24/24  Hip flexion      Hip extension      Hip abduction      Hip adduction      Hip internal rotation      Hip external rotation  Knee flexion      Knee extension      Ankle dorsiflexion 5d 5d 10d 8d  Ankle plantarflexion      Ankle inversion      Ankle eversion       (Blank rows = not tested)  LOWER EXTREMITY MMT:  MMT Right eval Left eval  Hip flexion 4 4  Hip extension 4 4  Hip abduction 4 4  Hip adduction    Hip internal rotation    Hip external rotation    Knee flexion 4 4  Knee extension 4 4  Ankle dorsiflexion    Ankle plantarflexion 3 3  Ankle inversion     Ankle eversion     (Blank rows = not tested)  FUNCTIONAL TESTS:  30 seconds chair stand test 10 reps 2 MWT 285; 02/24/24 350 ft  GAIT: Distance walked: 23ft x2 Assistive device utilized: Single point cane Level of assistance: Complete Independence Comments: slow cadence, antalgic                                                                                                                                 TREATMENT DATE:  [facility] Adult PT Treatment:                                                DATE: 02/24/24  Therapeutic Activity: Assessment of progress towards goals, LEFS, 2 MWT, ROM, and 30s chair stand test.  [facility] Adult PT Treatment:                                                DATE: 02/08/24 Eval and HEP Self Care: Additional minutes spent for educating on updated Therapeutic Home Exercise Program as well as comparing current status to condition at start of symptoms. This included exercises focusing on stretching, strengthening, with focus on eccentric aspects. Long term goals include an improvement in range of motion, strength, endurance as well as avoiding reinjury. Patient's frequency would include in 1-2 times a day, 3-5 times a week for a duration of 6-12 weeks. Proper technique shown and discussed handout in great detail. All questions were discussed and addressed.      PATIENT EDUCATION:  Education details: Discussed eval findings, rehab rationale and POC and patient is in agreement  Person educated: Patient Education method: Explanation Education comprehension: verbalized understanding and needs further education  HOME EXERCISE PROGRAM: Access Code: [email or web address] URL: [email or web address] Date: 02/24/2024 Prepared by: Gretta Leavens  Exercises - Sit to Stand with Armchair  - 1-2 x daily - 3 x weekly - 1 sets - 5 reps - Seated Heel Raise  - 1-2 x daily - 3 x weekly -  2 sets - 10 reps - Long Sitting Plantar Fascia Stretch with Towel  - 1-2 x daily - 3 x  weekly - 1 sets - 2 reps - 30s hold - Seated Toe Raise  - 1-2 x daily - 3 x weekly - 1 sets - 10 reps - Heel Toe Raises with Counter Support  - 1-2 x daily - 3 x weekly - 1 sets - 10 reps - Sit to Stand with Arms Crossed  - 1-2 x daily - 3 x weekly - 1 sets - 5 reps  ASSESSMENT:  CLINICAL IMPRESSION:  All goals met and pain painfree.  Feels she can DC OPPT. Patient is a 68 y.o. female who was seen today for physical therapy evaluation and treatment for B calf pain due to soft tissue restrictions in gastroc/soleus complex as well as pain from suspected claudication due to known circulation. 30s chair stand test finds mild strength deficits and endurance limitations identified on 2 MWT.  LEFS finds patient functioning at 60%  OBJECTIVE IMPAIRMENTS: Abnormal gait, decreased activity tolerance, decreased endurance, decreased knowledge of condition, decreased mobility, difficulty walking, decreased strength, and pain.   ACTIVITY LIMITATIONS: standing, squatting, and prolonged standing  PERSONAL FACTORS: Fitness, Past/current experiences, Time since onset of injury/illness/exacerbation, and 1 comorbidity: claudication are also affecting patient's functional outcome.   REHAB POTENTIAL: Good  CLINICAL DECISION MAKING: Evolving/moderate complexity  EVALUATION COMPLEXITY: Low   GOALS: Goals reviewed with patient? No  SHORT TERM GOALS=LONG TERM GOALS: Target date: 03/21/2024    Patient will score at least 58/80 on LEFS to signify clinically meaningful improvement in functional abilities.   Baseline: 48/80; 67/80 Goal status: Met  2.  Patient will acknowledge 6/10 pain at least once during episode of care   Baseline: 8/10; 02/24/24 0/10 Goal status: Met  3.  Patient will increase 30s chair stand reps from 10 to 12 without arms to demonstrate and improved functional ability with less pain/difficulty as well as reduce fall risk.  Baseline: 10; 02/24/24 11 Goal status: Met  4.  Patient to  demonstrate independence in HEP  Baseline: [id number] Goal status: Met  5.  Increase distance on 2 MWT  Baseline: 225ft with cane; 02/24/24 320ft Goal status: Met  6.  10d B DF AROM Baseline: 5d B DF AROM 02/24/24  Active ROM Right eval Left eval R 02/24/24 L 02/24/24  Hip flexion      Hip extension      Hip abduction      Hip adduction      Hip internal rotation      Hip external rotation      Knee flexion      Knee extension      Ankle dorsiflexion 5d 5d 10d 8d   Goal status: Met   PLAN:  PT FREQUENCY: 1x/week  PT DURATION: 6 weeks  PLANNED INTERVENTIONS: 97110-Therapeutic exercises, 97530- Therapeutic activity, 97112- Neuromuscular re-education, 97535- Self Care, and 16109- Manual therapy  PLAN FOR NEXT SESSION: HEP review and update, manual techniques as appropriate, aerobic tasks, ROM and flexibility activities, strengthening and PREs, TPDN, gait and balance training as needed    Date of referral: 01/12/24 Referring provider: Marius Siemens, NP Referring diagnosis? [phone] (ICD-10-CM) - Pain in both lower legs R26.9 (ICD-10-CM) - Abnormality of gait Treatment diagnosis? (if different than referring diagnosis) Pain in both lower legs   What was this (referring dx) caused by? Ongoing Issue  Lonne Roan of Condition: Chronic (continuous duration > 3 months)   Laterality:  Both  Current Functional Measure Score: LEFS 48/80   Objective measurements identify impairments when they are compared to normal values, the uninvolved extremity, and prior level of function.  [x]  Yes  []  No  Objective assessment of functional ability: Moderate functional limitations   Briefly describe symptoms: B calf pain with prolonged standing  How did symptoms start: progressive over time  Average pain intensity:  Last 24 hours: 8/10  Past week: 8/10  How often does the pt experience symptoms? Intermittently  How much have the symptoms interfered with usual daily activities?  Quite a bit  How has condition changed since care began at this facility? NA - initial visit  In general, how is the patients overall health? Good   BACK PAIN (STarT Back Screening Tool) No   Eldon Greenland, PT 02/24/2024, 10:37 AM

## 2024-02-23 NOTE — Telephone Encounter (Signed)
 Pt was contacted via phone call at 11:16 AM regarding missed appointment at 1045. Pt was educated regarding attendance policy, effect of current POC, and next appointment time.   Albesa Huguenin, PT, DPT 02/23/2024, 11:16 AM

## 2024-02-24 ENCOUNTER — Ambulatory Visit: Attending: Primary Care

## 2024-02-24 DIAGNOSIS — R2689 Other abnormalities of gait and mobility: Secondary | ICD-10-CM | POA: Diagnosis not present

## 2024-02-24 DIAGNOSIS — M79604 Pain in right leg: Secondary | ICD-10-CM | POA: Diagnosis not present

## 2024-02-24 DIAGNOSIS — M79605 Pain in left leg: Secondary | ICD-10-CM

## 2024-02-29 ENCOUNTER — Ambulatory Visit

## 2024-03-05 ENCOUNTER — Encounter: Payer: Self-pay | Admitting: Podiatry

## 2024-03-05 ENCOUNTER — Ambulatory Visit (INDEPENDENT_AMBULATORY_CARE_PROVIDER_SITE_OTHER): Admitting: Podiatry

## 2024-03-05 DIAGNOSIS — L97512 Non-pressure chronic ulcer of other part of right foot with fat layer exposed: Secondary | ICD-10-CM | POA: Diagnosis not present

## 2024-03-05 DIAGNOSIS — I739 Peripheral vascular disease, unspecified: Secondary | ICD-10-CM

## 2024-03-05 NOTE — Progress Notes (Signed)
 Subjective:  Patient ID: Danielle Morton, female    DOB: Oct 19, 1955,   MRN: [id number]  Chief Complaint  Patient presents with   Wound Check    Rm8 wound check right foot lateral/not diabetic/     68 y.o. female presents for follow-up of wound on right foot. She has had ABI and severely diminished blood flow. She has follow-up with Dr. Vikki Graves on Wednesday for evaluation. . Denies any other pedal complaints. Denies n/v/f/c.   Past Medical History:  Diagnosis Date   Hypertension    PVD (peripheral vascular disease) (HCC)     Objective:  Physical Exam: Vascular: DP/PT pulses non palpable bilateral. CFT <5 seconds. Absent hair growth on digits. Edema noted to bilateral lower extremities. Xerosis noted bilaterally.  Skin. No lacerations or abrasions bilateral feet. Nails 1-5 bilateral  are thickened discolored and elongated with subungual debris. Hyperkeratotic lesion noted lateral fifth digit upon debridement open ulceration noted with mild erythema surrounding and edema.  Musculoskeletal: MMT 5/5 bilateral lower extremities in DF, PF, Inversion and Eversion. Deceased ROM in DF of ankle joint.  Neurological: Sensation intact to light touch. Protective sensation diminished bilateral.    +-------+-----------+-----------+------------+------------+  ABI/TBIToday's ABIToday's TBIPrevious ABIPrevious TBI  +-------+-----------+-----------+------------+------------+  Right 0.0        0.0                                  +-------+-----------+-----------+------------+------------+  Left  .55        .52                                  +-------+-----------+-----------+------------+------------+       TOES Findings:  +----------+---------------+--------+-------+  Right ToesPressure (mmHg)WaveformComment  +----------+---------------+--------+-------+  1st Digit                Absent           +----------+---------------+--------+-------+  2nd Digit                 Absent           +----------+---------------+--------+-------+  3rd Digit                Absent           +----------+---------------+--------+-------+  4th Digit                Absent           +----------+---------------+--------+-------+  5th Digit                Absent           +----------+---------------+--------+-------+      +---------+---------------+--------+------------------+  Left ToesPressure (mmHg)WaveformComment             +---------+---------------+--------+------------------+  1st Digit               AbnormalSevere              +---------+---------------+--------+------------------+  2nd Digit               AbnormalModerate to Severe  +---------+---------------+--------+------------------+  3rd Digit               AbnormalModerate            +---------+---------------+--------+------------------+  4th Digit               AbnormalModerate to Severe  +---------+---------------+--------+------------------+  5th Digit  AbnormalSevere              +---------+---------------+--------+------------------+           Findings reported to Jennefer Moats, DPM at 11:50 am.   Summary:  Right: Resting right ankle-brachial index indicates critical limb  ischemia. The right toe-brachial index is abnormal.   Left: Resting left ankle-brachial index indicates moderate left lower  extremity arterial disease. The left toe-brachial index is abnormal.   Assessment:   1. 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 Peripheral arterial disease (HCC)      Plan:  Ulcer lateral fifth metatarsal head with fat layer exposed  -X-rays reviewed no osseous erosions noted. Edema noted around ulceration site  -No debridement  No deep debridement concern for circulation.  -Dressed with betadine, DSD. -Off-loading with surgical shoe.  -No additional antibiotics ordered today.  -ABIs reviewed. Has follow-up with Dr.  Vikki Graves on Wednesday  -Discussed glucose control and proper protein-rich diet.  -Discussed if any worsening redness, pain, fever or chills to call or may need to report to the emergency room. Patient expressed understanding.   Return 2 weeks for wound check.   No follow-ups on file.    Jennefer Moats, DPM

## 2024-03-07 ENCOUNTER — Ambulatory Visit

## 2024-03-07 ENCOUNTER — Other Ambulatory Visit: Payer: Self-pay

## 2024-03-07 ENCOUNTER — Encounter: Payer: Self-pay | Admitting: Vascular Surgery

## 2024-03-07 ENCOUNTER — Encounter: Admitting: Physical Therapy

## 2024-03-07 ENCOUNTER — Ambulatory Visit: Attending: Vascular Surgery | Admitting: Vascular Surgery

## 2024-03-07 ENCOUNTER — Ambulatory Visit: Admission: RE | Admit: 2024-03-07 | Source: Ambulatory Visit

## 2024-03-07 VITALS — BP 113/74 | HR 65 | Temp 97.9°F | Ht 66.0 in | Wt 121.0 lb

## 2024-03-07 DIAGNOSIS — I7025 Atherosclerosis of native arteries of other extremities with ulceration: Secondary | ICD-10-CM

## 2024-03-07 NOTE — H&P (View-Only) (Signed)
 Patient ID: Danielle Morton, female   DOB: 02/15/1956, 68 y.o.   MRN: [id number]  Reason for Consult: New Patient (Initial Visit)   Referred by Jennefer Moats, DPM  Subjective:     HPI:  Danielle Morton is a 68 y.o. 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  She now has an ulcer on her right lateral foot.  She states that she no longer smokes.  She walks with the help of a cane.  She believes she takes aspirin  Plavix  these are not on her medication list.  She does take statin which is listed.  She does have continued numbness of the left leg no numbness or pain in the right leg other than where the wound is located.  She follows diligently with podiatry for wound care.  Past Medical History:  Diagnosis Date   Hypertension    PVD (peripheral vascular disease) (HCC)    Family History  Problem Relation Age of Onset   Colon cancer Neg Hx    Pancreatic cancer Neg Hx    Stomach cancer Neg Hx    Esophageal cancer Neg Hx    Past Surgical History:  Procedure Laterality Date   ANGIOPLASTY N/A 12/28/2021   Procedure: BALLOON ANGIOPLASTY OF SUPERIOR MESENTERIC ARTERY;  Surgeon: Kayla Part, MD;  Location: [facility] OR;  Service: Vascular;  Laterality: N/A;   ENDARTERECTOMY FEMORAL Left 08/15/2018   Procedure: ENDARTERECTOMY COMMON FEMORAL;  Surgeon: Adine Hoof, MD;  Location: [facility] OR;  Service: Vascular;  Laterality: Left;   INSERTION OF ILIAC STENT Left 08/15/2018   Procedure: INSERTION OF ILIAC STENT RETROGRADE;  Surgeon: Adine Hoof, MD;  Location: [facility] OR;  Service: Vascular;  Laterality: Left;   LOWER EXTREMITY ANGIOGRAPHY N/A 08/14/2018   Procedure: LOWER EXTREMITY ANGIOGRAPHY;  Surgeon: Adine Hoof, MD;  Location: [facility] INVASIVE CV LAB;  Service: Cardiovascular;  Laterality: N/A;   MESENTERIC ARTERY BYPASS Left  12/28/2021   Procedure: MESENTERIC ANGIOGRAPHY FROM LEFT BRACHIAL ARTERY;  Surgeon: Kayla Part, MD;  Location: [facility] OR;  Service: Vascular;  Laterality: Left;    Short Social History:  Social History   Tobacco Use   Smoking status: Former    Current packs/day: 1.00    Average packs/day: 1 pack/day for 30.0 years (30.0 ttl pk-yrs)    Types: Cigarettes   Smokeless tobacco: Never  Substance Use Topics   Alcohol use: Not Currently    Alcohol/week: 3.0 standard drinks of alcohol    Types: 3 Cans of beer per week    No Known Allergies  Current Outpatient Medications  Medication Sig Dispense Refill   amLODipine  (NORVASC ) 5 MG tablet Take 1 tablet (5 mg total) by mouth daily. 90 tablet 1   Evolocumab  (REPATHA  SURECLICK) 140 MG/ML SOAJ Inject 140 mg into the skin every 14 (fourteen) days. 6 mL 3   hydrochlorothiazide  (HYDRODIURIL ) 25 MG tablet Take 1 tablet (25 mg total) by mouth daily. 90 tablet 1   rosuvastatin  (CRESTOR ) 40 MG tablet Take 1 tablet (40 mg total) by mouth daily. 90 tablet 1   cephALEXin  (KEFLEX ) 500 MG capsule Take 1 capsule (500 mg total) by mouth 3 (three) times daily. (Patient not taking: Reported on 03/05/2024) 30 capsule 2   No current facility-administered medications for this visit.    Review of Systems  Constitutional:  Constitutional negative. HENT: HENT negative.  Eyes: Eyes negative.  Respiratory: Respiratory negative.  Cardiovascular: Cardiovascular negative.  GI: Gastrointestinal negative.  Musculoskeletal: Musculoskeletal negative.  Skin: Positive for wound.  Neurological: Positive for numbness.  Hematologic: Hematologic/lymphatic negative.  Psychiatric: Psychiatric negative.        Objective:  Objective   Vitals:   03/07/24 0844  BP: 113/74  Pulse: 65  Temp: 97.9 F (36.6 C)  SpO2: 98%     Physical Exam HENT:     Head: Normocephalic.     Nose: Nose normal.   Eyes:     Pupils: Pupils are equal, round, and reactive to light.    Neck:     Vascular: No carotid bruit.   Cardiovascular:     Rate and Rhythm: Rhythm irregular.     Pulses:          Radial pulses are 1+ on the right side and 0 on the left side.       Femoral pulses are 0 on the right side and 2+ on the left side. Pulmonary:     Effort: Pulmonary effort is normal.  Abdominal:     General: Abdomen is flat.   Musculoskeletal:     Right lower leg: No edema.     Left lower leg: No edema.   Neurological:     General: No focal deficit present.     Mental Status: She is alert.   Psychiatric:        Mood and Affect: Mood normal.        Thought Content: Thought content normal.     Data: ABI Findings:  +---------+------------------+-----+--------+--------+  Right   Rt Pressure (mmHg)IndexWaveformComment   +---------+------------------+-----+--------+--------+  Brachial 170                                      +---------+------------------+-----+--------+--------+  PTA     0                 0.00 absent            +---------+------------------+-----+--------+--------+  PERO    0                 0.00 absent            +---------+------------------+-----+--------+--------+  DP      0                 0.00 absent            +---------+------------------+-----+--------+--------+  Great Toe0                 0.00 Absent            +---------+------------------+-----+--------+--------+   +---------+------------------+-----+----------+-------+  Left    Lt Pressure (mmHg)IndexWaveform  Comment  +---------+------------------+-----+----------+-------+  Brachial 162                                       +---------+------------------+-----+----------+-------+  PTA     94                0.55 monophasic         +---------+------------------+-----+----------+-------+  PERO    74                0.44 monophasic         +---------+------------------+-----+----------+-------+  DP      94  0.55 monophasic         +---------+------------------+-----+----------+-------+  Great Toe88                0.52 Abnormal           +---------+------------------+-----+----------+-------+   +-------+-----------+-----------+------------+------------+  ABI/TBIToday's ABIToday's TBIPrevious ABIPrevious TBI  +-------+-----------+-----------+------------+------------+  Right 0.0        0.0                                  +-------+-----------+-----------+------------+------------+  Left  .55        .52                                  +-------+-----------+-----------+------------+------------+       TOES Findings:  +----------+---------------+--------+-------+  Right ToesPressure (mmHg)WaveformComment  +----------+---------------+--------+-------+  1st Digit                Absent           +----------+---------------+--------+-------+  2nd Digit                Absent           +----------+---------------+--------+-------+  3rd Digit                Absent           +----------+---------------+--------+-------+  4th Digit                Absent           +----------+---------------+--------+-------+  5th Digit                Absent           +----------+---------------+--------+-------+      +---------+---------------+--------+------------------+  Left ToesPressure (mmHg)WaveformComment             +---------+---------------+--------+------------------+  1st Digit               AbnormalSevere              +---------+---------------+--------+------------------+  2nd Digit               AbnormalModerate to Severe  +---------+---------------+--------+------------------+  3rd Digit               AbnormalModerate            +---------+---------------+--------+------------------+  4th Digit               AbnormalModerate to Severe  +---------+---------------+--------+------------------+  5th Digit                AbnormalSevere              +---------+---------------+--------+------------------+           Findings reported to Jennefer Moats, DPM at 11:50 am.   Summary:  Right: Resting right ankle-brachial index indicates critical limb  ischemia. The right toe-brachial index is abnormal.   Left: Resting left ankle-brachial index indicates moderate left lower  extremity arterial disease. The left toe-brachial index is abnormal.      Assessment/Plan:     68 year old female with right lower extremity lateral foot ulceration with known long segment SFA occlusion and common femoral disease from previous angiography several years ago.  She does have a strongly palpable left common femoral pulse without palpable right common femoral pulse at this time.  Plan will be for angiography from a left common femoral approach this  will more than likely be diagnostic to evaluate for bypass options.  We discussed that this represents a limb threatening situation and without revascularization she will be a very high risk of limb loss.  We discussed the risk benefits alternatives she demonstrates good understanding this will be scheduled on Monday in the very near future.   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 The patient is on best medical therapy for peripheral arterial disease. The patient has been counseled about the risks of tobacco use in atherosclerotic disease. The patient has been counseled to abstain from any tobacco use. An aortogram with bilateral lower extremity runoff angiography and Right lower extremity intervention and is indicated to better evaluate the patient's lower extremity circulation because of the limb threatening nature of the patient's diagnosis. Based on the patient's clinical exam and non-invasive data, we anticipate an endovascular intervention in the terminal aortic, iliac, and femoropopliteal vessels.  Stenting and/or athrectomy would be favored because of the improved primary patency of these interventions as compared to plain balloon angioplasty.      Adine Hoof MD [facility]

## 2024-03-07 NOTE — Progress Notes (Signed)
 Patient ID: Danielle Morton, female   DOB: 02/15/1956, 68 y.o.   MRN: [id number]  Reason for Consult: New Patient (Initial Visit)   Referred by Jennefer Moats, DPM  Subjective:     HPI:  Danielle Morton is a 68 y.o. 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  She now has an ulcer on her right lateral foot.  She states that she no longer smokes.  She walks with the help of a cane.  She believes she takes aspirin  Plavix  these are not on her medication list.  She does take statin which is listed.  She does have continued numbness of the left leg no numbness or pain in the right leg other than where the wound is located.  She follows diligently with podiatry for wound care.  Past Medical History:  Diagnosis Date   Hypertension    PVD (peripheral vascular disease) (HCC)    Family History  Problem Relation Age of Onset   Colon cancer Neg Hx    Pancreatic cancer Neg Hx    Stomach cancer Neg Hx    Esophageal cancer Neg Hx    Past Surgical History:  Procedure Laterality Date   ANGIOPLASTY N/A 12/28/2021   Procedure: BALLOON ANGIOPLASTY OF SUPERIOR MESENTERIC ARTERY;  Surgeon: Kayla Part, MD;  Location: [facility] OR;  Service: Vascular;  Laterality: N/A;   ENDARTERECTOMY FEMORAL Left 08/15/2018   Procedure: ENDARTERECTOMY COMMON FEMORAL;  Surgeon: Adine Hoof, MD;  Location: [facility] OR;  Service: Vascular;  Laterality: Left;   INSERTION OF ILIAC STENT Left 08/15/2018   Procedure: INSERTION OF ILIAC STENT RETROGRADE;  Surgeon: Adine Hoof, MD;  Location: [facility] OR;  Service: Vascular;  Laterality: Left;   LOWER EXTREMITY ANGIOGRAPHY N/A 08/14/2018   Procedure: LOWER EXTREMITY ANGIOGRAPHY;  Surgeon: Adine Hoof, MD;  Location: [facility] INVASIVE CV LAB;  Service: Cardiovascular;  Laterality: N/A;   MESENTERIC ARTERY BYPASS Left  12/28/2021   Procedure: MESENTERIC ANGIOGRAPHY FROM LEFT BRACHIAL ARTERY;  Surgeon: Kayla Part, MD;  Location: [facility] OR;  Service: Vascular;  Laterality: Left;    Short Social History:  Social History   Tobacco Use   Smoking status: Former    Current packs/day: 1.00    Average packs/day: 1 pack/day for 30.0 years (30.0 ttl pk-yrs)    Types: Cigarettes   Smokeless tobacco: Never  Substance Use Topics   Alcohol use: Not Currently    Alcohol/week: 3.0 standard drinks of alcohol    Types: 3 Cans of beer per week    No Known Allergies  Current Outpatient Medications  Medication Sig Dispense Refill   amLODipine  (NORVASC ) 5 MG tablet Take 1 tablet (5 mg total) by mouth daily. 90 tablet 1   Evolocumab  (REPATHA  SURECLICK) 140 MG/ML SOAJ Inject 140 mg into the skin every 14 (fourteen) days. 6 mL 3   hydrochlorothiazide  (HYDRODIURIL ) 25 MG tablet Take 1 tablet (25 mg total) by mouth daily. 90 tablet 1   rosuvastatin  (CRESTOR ) 40 MG tablet Take 1 tablet (40 mg total) by mouth daily. 90 tablet 1   cephALEXin  (KEFLEX ) 500 MG capsule Take 1 capsule (500 mg total) by mouth 3 (three) times daily. (Patient not taking: Reported on 03/05/2024) 30 capsule 2   No current facility-administered medications for this visit.    Review of Systems  Constitutional:  Constitutional negative. HENT: HENT negative.  Eyes: Eyes negative.  Respiratory: Respiratory negative.  Cardiovascular: Cardiovascular negative.  GI: Gastrointestinal negative.  Musculoskeletal: Musculoskeletal negative.  Skin: Positive for wound.  Neurological: Positive for numbness.  Hematologic: Hematologic/lymphatic negative.  Psychiatric: Psychiatric negative.        Objective:  Objective   Vitals:   03/07/24 0844  BP: 113/74  Pulse: 65  Temp: 97.9 F (36.6 C)  SpO2: 98%     Physical Exam HENT:     Head: Normocephalic.     Nose: Nose normal.   Eyes:     Pupils: Pupils are equal, round, and reactive to light.    Neck:     Vascular: No carotid bruit.   Cardiovascular:     Rate and Rhythm: Rhythm irregular.     Pulses:          Radial pulses are 1+ on the right side and 0 on the left side.       Femoral pulses are 0 on the right side and 2+ on the left side. Pulmonary:     Effort: Pulmonary effort is normal.  Abdominal:     General: Abdomen is flat.   Musculoskeletal:     Right lower leg: No edema.     Left lower leg: No edema.   Neurological:     General: No focal deficit present.     Mental Status: She is alert.   Psychiatric:        Mood and Affect: Mood normal.        Thought Content: Thought content normal.     Data: ABI Findings:  +---------+------------------+-----+--------+--------+  Right   Rt Pressure (mmHg)IndexWaveformComment   +---------+------------------+-----+--------+--------+  Brachial 170                                      +---------+------------------+-----+--------+--------+  PTA     0                 0.00 absent            +---------+------------------+-----+--------+--------+  PERO    0                 0.00 absent            +---------+------------------+-----+--------+--------+  DP      0                 0.00 absent            +---------+------------------+-----+--------+--------+  Great Toe0                 0.00 Absent            +---------+------------------+-----+--------+--------+   +---------+------------------+-----+----------+-------+  Left    Lt Pressure (mmHg)IndexWaveform  Comment  +---------+------------------+-----+----------+-------+  Brachial 162                                       +---------+------------------+-----+----------+-------+  PTA     94                0.55 monophasic         +---------+------------------+-----+----------+-------+  PERO    74                0.44 monophasic         +---------+------------------+-----+----------+-------+  DP      94  0.55 monophasic         +---------+------------------+-----+----------+-------+  Great Toe88                0.52 Abnormal           +---------+------------------+-----+----------+-------+   +-------+-----------+-----------+------------+------------+  ABI/TBIToday's ABIToday's TBIPrevious ABIPrevious TBI  +-------+-----------+-----------+------------+------------+  Right 0.0        0.0                                  +-------+-----------+-----------+------------+------------+  Left  .55        .52                                  +-------+-----------+-----------+------------+------------+       TOES Findings:  +----------+---------------+--------+-------+  Right ToesPressure (mmHg)WaveformComment  +----------+---------------+--------+-------+  1st Digit                Absent           +----------+---------------+--------+-------+  2nd Digit                Absent           +----------+---------------+--------+-------+  3rd Digit                Absent           +----------+---------------+--------+-------+  4th Digit                Absent           +----------+---------------+--------+-------+  5th Digit                Absent           +----------+---------------+--------+-------+      +---------+---------------+--------+------------------+  Left ToesPressure (mmHg)WaveformComment             +---------+---------------+--------+------------------+  1st Digit               AbnormalSevere              +---------+---------------+--------+------------------+  2nd Digit               AbnormalModerate to Severe  +---------+---------------+--------+------------------+  3rd Digit               AbnormalModerate            +---------+---------------+--------+------------------+  4th Digit               AbnormalModerate to Severe  +---------+---------------+--------+------------------+  5th Digit                AbnormalSevere              +---------+---------------+--------+------------------+           Findings reported to Jennefer Moats, DPM at 11:50 am.   Summary:  Right: Resting right ankle-brachial index indicates critical limb  ischemia. The right toe-brachial index is abnormal.   Left: Resting left ankle-brachial index indicates moderate left lower  extremity arterial disease. The left toe-brachial index is abnormal.      Assessment/Plan:     68 year old female with right lower extremity lateral foot ulceration with known long segment SFA occlusion and common femoral disease from previous angiography several years ago.  She does have a strongly palpable left common femoral pulse without palpable right common femoral pulse at this time.  Plan will be for angiography from a left common femoral approach this  will more than likely be diagnostic to evaluate for bypass options.  We discussed that this represents a limb threatening situation and without revascularization she will be a very high risk of limb loss.  We discussed the risk benefits alternatives she demonstrates good understanding this will be scheduled on Monday in the very near future.   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 The patient is on best medical therapy for peripheral arterial disease. The patient has been counseled about the risks of tobacco use in atherosclerotic disease. The patient has been counseled to abstain from any tobacco use. An aortogram with bilateral lower extremity runoff angiography and Right lower extremity intervention and is indicated to better evaluate the patient's lower extremity circulation because of the limb threatening nature of the patient's diagnosis. Based on the patient's clinical exam and non-invasive data, we anticipate an endovascular intervention in the terminal aortic, iliac, and femoropopliteal vessels.  Stenting and/or athrectomy would be favored because of the improved primary patency of these interventions as compared to plain balloon angioplasty.      Adine Hoof MD [facility]

## 2024-03-09 ENCOUNTER — Other Ambulatory Visit: Payer: Self-pay

## 2024-03-12 ENCOUNTER — Encounter (HOSPITAL_COMMUNITY)
Admission: RE | Disposition: A | Discharge status: Home / Self Care | Payer: Self-pay | Source: Home / Self Care | Attending: Vascular Surgery

## 2024-03-12 ENCOUNTER — Ambulatory Visit (HOSPITAL_COMMUNITY)
Admission: RE | Admit: 2024-03-12 | Discharge: 2024-03-12 | Disposition: A | Discharge status: Home / Self Care | Attending: Vascular Surgery | Admitting: Vascular Surgery

## 2024-03-12 ENCOUNTER — Other Ambulatory Visit: Payer: Self-pay

## 2024-03-12 DIAGNOSIS — Z95828 Presence of other vascular implants and grafts: Secondary | ICD-10-CM | POA: Insufficient documentation

## 2024-03-12 DIAGNOSIS — Z87891 Personal history of nicotine dependence: Secondary | ICD-10-CM

## 2024-03-12 DIAGNOSIS — Z7902 Long term (current) use of antithrombotics/antiplatelets: Secondary | ICD-10-CM | POA: Insufficient documentation

## 2024-03-12 DIAGNOSIS — I70234 Atherosclerosis of native arteries of right leg with ulceration of heel and midfoot: Secondary | ICD-10-CM

## 2024-03-12 DIAGNOSIS — L97519 Non-pressure chronic ulcer of other part of right foot with unspecified severity: Secondary | ICD-10-CM | POA: Diagnosis not present

## 2024-03-12 DIAGNOSIS — I70235 Atherosclerosis of native arteries of right leg with ulceration of other part of foot: Secondary | ICD-10-CM | POA: Insufficient documentation

## 2024-03-12 DIAGNOSIS — I700 Atherosclerosis of aorta: Secondary | ICD-10-CM | POA: Diagnosis not present

## 2024-03-12 DIAGNOSIS — Z95820 Peripheral vascular angioplasty status with implants and grafts: Secondary | ICD-10-CM | POA: Diagnosis not present

## 2024-03-12 DIAGNOSIS — Z7982 Long term (current) use of aspirin: Secondary | ICD-10-CM | POA: Diagnosis not present

## 2024-03-12 DIAGNOSIS — Z9889 Other specified postprocedural states: Secondary | ICD-10-CM | POA: Diagnosis not present

## 2024-03-12 DIAGNOSIS — I7025 Atherosclerosis of native arteries of other extremities with ulceration: Secondary | ICD-10-CM

## 2024-03-12 HISTORY — PX: LOWER EXTREMITY ANGIOGRAPHY: CATH118251

## 2024-03-12 HISTORY — PX: LOWER EXTREMITY INTERVENTION: CATH118252

## 2024-03-12 HISTORY — PX: ABDOMINAL AORTOGRAM W/LOWER EXTREMITY: CATH118223

## 2024-03-12 LAB — POCT I-STAT, CHEM 8
BUN: 10 mg/dL (ref 8–23)
Calcium, Ion: 1.23 mmol/L (ref 1.15–1.40)
Chloride: 100 mmol/L (ref 98–111)
Creatinine, Ser: 0.5 mg/dL (ref 0.44–1.00)
Glucose, Bld: 105 mg/dL — ABNORMAL HIGH (ref 70–99)
HCT: 35 % — ABNORMAL LOW (ref 36.0–46.0)
Hemoglobin: 11.9 g/dL — ABNORMAL LOW (ref 12.0–15.0)
Potassium: 3.7 mmol/L (ref 3.5–5.1)
Sodium: 139 mmol/L (ref 135–145)
TCO2: 27 mmol/L (ref 22–32)

## 2024-03-12 SURGERY — ABDOMINAL AORTOGRAM W/LOWER EXTREMITY
Anesthesia: LOCAL

## 2024-03-12 MED ORDER — SODIUM CHLORIDE 0.9 % IV SOLN: 250.0000 mL | INTRAVENOUS | Status: DC | PRN

## 2024-03-12 MED ORDER — HYDRALAZINE HCL 20 MG/ML IJ SOLN: 5.0000 mg | INTRAMUSCULAR | Status: DC | PRN

## 2024-03-12 MED ORDER — OXYCODONE HCL 5 MG PO TABS: 5.0000 mg | ORAL_TABLET | ORAL | Status: DC | PRN

## 2024-03-12 MED ORDER — MIDAZOLAM HCL 2 MG/2ML IJ SOLN
INTRAMUSCULAR | Status: AC
  Filled 2024-03-12: qty 2

## 2024-03-12 MED ORDER — LIDOCAINE HCL (PF) 1 % IJ SOLN
INTRAMUSCULAR | Status: DC | PRN
  Administered 2024-03-12: 5 mL

## 2024-03-12 MED ORDER — IODIXANOL 320 MG/ML IV SOLN
INTRAVENOUS | Status: DC | PRN
Start: 2024-03-12 — End: 2024-03-12
  Administered 2024-03-12: 78 mL

## 2024-03-12 MED ORDER — SODIUM CHLORIDE 0.9 % IV SOLN: INTRAVENOUS | Status: DC

## 2024-03-12 MED ORDER — SODIUM CHLORIDE 0.9% FLUSH: 3.0000 mL | INTRAVENOUS | Status: DC | PRN

## 2024-03-12 MED ORDER — SODIUM CHLORIDE 0.9 % WEIGHT BASED INFUSION
1.0000 mL/kg/h | INTRAVENOUS | Status: DC
  Administered 2024-03-12: 1 mL/kg/h via INTRAVENOUS

## 2024-03-12 MED ORDER — FENTANYL CITRATE (PF) 100 MCG/2ML IJ SOLN
INTRAMUSCULAR | Status: AC
  Filled 2024-03-12: qty 2

## 2024-03-12 MED ORDER — HEPARIN (PORCINE) IN NACL 1000-0.9 UT/500ML-% IV SOLN
INTRAVENOUS | Status: DC | PRN
  Administered 2024-03-12: 1000 mL

## 2024-03-12 MED ORDER — LIDOCAINE HCL (PF) 1 % IJ SOLN
INTRAMUSCULAR | Status: AC
  Filled 2024-03-12: qty 30

## 2024-03-12 MED ORDER — SODIUM CHLORIDE 0.9% FLUSH: 3.0000 mL | Freq: Two times a day (BID) | INTRAVENOUS | Status: DC

## 2024-03-12 MED ORDER — FENTANYL CITRATE (PF) 100 MCG/2ML IJ SOLN
INTRAMUSCULAR | Status: DC | PRN
  Administered 2024-03-12: 50 ug via INTRAVENOUS

## 2024-03-12 MED ORDER — ACETAMINOPHEN 325 MG PO TABS: 650.0000 mg | ORAL_TABLET | ORAL | Status: DC | PRN

## 2024-03-12 MED ORDER — ONDANSETRON HCL 4 MG/2ML IJ SOLN: 4.0000 mg | Freq: Four times a day (QID) | INTRAMUSCULAR | Status: DC | PRN

## 2024-03-12 MED ORDER — MIDAZOLAM HCL 2 MG/2ML IJ SOLN
INTRAMUSCULAR | Status: DC | PRN
  Administered 2024-03-12: 1 mg via INTRAVENOUS

## 2024-03-12 MED ORDER — LABETALOL HCL 5 MG/ML IV SOLN: 10.0000 mg | INTRAVENOUS | Status: DC | PRN

## 2024-03-12 SURGICAL SUPPLY — 10 items
CATH OMNI FLUSH 5F 65CM (CATHETERS) IMPLANT
COVER DOME SNAP 22 D (MISCELLANEOUS) IMPLANT
KIT MICROPUNCTURE NIT STIFF (SHEATH) IMPLANT
KIT SINGLE USE MANIFOLD (KITS) IMPLANT
KIT SYRINGE INJ CVI SPIKEX1 (MISCELLANEOUS) IMPLANT
SET ATX-X65L (MISCELLANEOUS) IMPLANT
SHEATH PINNACLE 5F 10CM (SHEATH) IMPLANT
SHEATH PROBE COVER 6X72 (BAG) IMPLANT
TRAY PV CATH (CUSTOM PROCEDURE TRAY) ×2 IMPLANT
WIRE BENTSON .035X145CM (WIRE) IMPLANT

## 2024-03-12 NOTE — Interval H&P Note (Signed)
 History and Physical Interval Note:  03/12/2024 8:46 AM  [name]  has presented today for surgery, with the diagnosis of atherosclerosis bilateral with ulceration.  The various methods of treatment have been discussed with the patient and family. After consideration of risks, benefits and other options for treatment, the patient has consented to  Procedure(s): ABDOMINAL AORTOGRAM W/LOWER EXTREMITY (N/A) Lower Extremity Angiography (N/A) LOWER EXTREMITY INTERVENTION (N/A) as a surgical intervention.  The patient's history has been reviewed, patient examined, no change in status, stable for surgery.  I have reviewed the patient's chart and labs.  Questions were answered to the patient's satisfaction.     Danielle Morton

## 2024-03-12 NOTE — Op Note (Signed)
    Patient name: Adaley Kiene MRN: [id number] DOB: 1956-01-29 Sex: female  03/12/2024 Pre-operative Diagnosis: Atherosclerosis native arteries right lower extremity with ulceration Post-operative diagnosis:  Same Surgeon:  Penne BROCKS. Sheree, MD Procedure Performed: 1.  Percutaneous ultrasound-guided cannulation left common femoral artery 2.  Catheter in aorta and aortogram with bilateral lower extremity angiography 3.  Catheter selection right common femoral artery 4.  Moderate sedation with fentanyl  Versed  for 23 minutes   Indications: 68 year old female with a history of left common femoral endarterectomy with retrograde common iliac artery stenting.  She has a readily palpable left common femoral pulse no palpable right common femoral pulse now with small ulceration of the right lateral foot.  She has ABIs of 0 and she is indicated for angiography likely will not be a candidate for intervention and will require surgical intervention.  Findings: Aorta is heavily calcified the SMA and celiac are patent as is the IMA.  The left common iliac artery stent is patent hypogastric on the left is occluded there is disease in the external iliac artery and the left common femoral artery is patulous and patent.  The superficial femoral artery is flush occluded she reconstitutes at above-knee left popliteal artery and has dominant runoff via the peroneal artery and there is also diseased but reconstituted anterior tibial artery.  On the right side the common iliac artery is initially patent, hypogastric is occluded after approximate 1 cm there is a 90% stenosis of the external iliac artery and the common femoral artery is heavily calcified and subtotally occluded.  There is minimal profunda runoff.  There is no reconstitution of any distal vessels.  Plan will be for right common femoral endarterectomy with retrograde stenting and right lower extremity angiography to evaluate for bypass target and will possibly  perform bypass if there is a distal target..   Procedure:  The patient was identified in the holding area and taken to room 8.  The patient was then placed supine on the table and prepped and draped in the usual sterile fashion.  A time out was called.  Ultrasound was used to evaluate the left common femoral artery.  There was dense scar tissue from previous endarterectomy.  The area was anesthetized 1% lidocaine  and cannulated with a micropuncture needle followed by wire sheath.  Ultrasound images saved the permanent record.  Concomitantly we administered fentanyl  and Versed  as moderate sedation her vital signs were monitored throughout the case.  We placed a Bentson wire followed by 5 [place] sheath and then placed an Omni catheter to the level of T12 performed aortogram and pulled the bifurcation performed pelvic angiography with LAO projections.  We then were able to cross the bifurcation through the very tightly stenosed external iliac artery down to the right common femoral artery and perform right lower extremity angiography.  Unfortunately there was no reconstitution despite a delay of almost 40 seconds.  Previously she did have a reconstitution of the popliteal artery.  As such we removed the catheter over wire and will plan for surgical intervention.  We did perform left lower extremity angiography with the above findings.  Sheath will be pulled postoperative holding.  She tolerated procedure without any complication.   Contrast: 78cc   Ivyrose Hashman C. Sheree, MD [facility] [place] Office: [phone] Pager: [phone]

## 2024-03-12 NOTE — Discharge Instructions (Signed)
 Femoral Site Care This sheet gives you information about how to care for yourself after your procedure. Your health care provider may also give you more specific instructions. If you have problems or questions, contact your health care provider. What can I expect after the procedure?  After the procedure, it is common to have: Bruising that usually fades within 1-2 weeks. Tenderness at the site. Follow these instructions at home: Wound care Follow instructions from your health care provider about how to take care of your insertion site. Make sure you: Wash your hands with soap and water before you change your bandage (dressing). If soap and water are not available, use hand sanitizer. Remove your dressing as told by your health care provider. In 24 hours Do not take baths, swim, or use a hot tub until your health care provider approves. You may shower 24-48 hours after the procedure or as told by your health care provider. Gently wash the site with plain soap and water. Pat the area dry with a clean towel. Do not rub the site. This may cause bleeding. Do not apply powder or lotion to the site. Keep the site clean and dry. Check your femoral site every day for signs of infection. Check for: Redness, swelling, or pain. Fluid or blood. Warmth. Pus or a bad smell. Activity For the first 2-3 days after your procedure, or as long as directed: Avoid climbing stairs as much as possible. Do not squat. Do not lift anything that is heavier than 10 lb (4.5 kg), or the limit that you are told, until your health care provider says that it is safe. For 5 days Rest as directed. Avoid sitting for a long time without moving. Get up to take short walks every 1-2 hours. Do not drive for 24 hours if you were given a medicine to help you relax (sedative). General instructions Take over-the-counter and prescription medicines only as told by your health care provider. Keep all follow-up visits as told by  your health care provider. This is important. Contact a health care provider if you have: A fever or chills. You have redness, swelling, or pain around your insertion site. Get help right away if: The catheter insertion area swells very fast. You pass out. You suddenly start to sweat or your skin gets clammy. The catheter insertion area is bleeding, and the bleeding does not stop when you hold steady pressure on the area. The area near or just beyond the catheter insertion site becomes pale, cool, tingly, or numb. These symptoms may represent a serious problem that is an emergency. Do not wait to see if the symptoms will go away. Get medical help right away. Call your local emergency services (911 in the [place] Do not drive yourself to the hospital. Summary After the procedure, it is common to have bruising that usually fades within 1-2 weeks. Check your femoral site every day for signs of infection. Do not lift anything that is heavier than 10 lb (4.5 kg), or the limit that you are told, until your health care provider says that it is safe. This information is not intended to replace advice given to you by your health care provider. Make sure you discuss any questions you have with your health care provider. Document Revised: 09/19/2017 Document Reviewed: 09/19/2017 Elsevier Patient Education  2020 [employer].

## 2024-03-12 NOTE — Progress Notes (Signed)
 19fr sheath removed intact with no bleeding or hematoma noted. Manual pressure applied x . 4x4 gauze dressing applied. Post activity and precautions explained; patient verbalized understanding. Call bell near.Danielle Morton

## 2024-03-13 ENCOUNTER — Telehealth: Payer: Self-pay

## 2024-03-13 ENCOUNTER — Encounter (HOSPITAL_COMMUNITY): Payer: Self-pay | Admitting: Vascular Surgery

## 2024-03-13 NOTE — Telephone Encounter (Signed)
 Attempted call to schedule surgery.  No answer, mailbox full.

## 2024-03-14 ENCOUNTER — Other Ambulatory Visit: Payer: Self-pay

## 2024-03-14 ENCOUNTER — Telehealth: Payer: Self-pay

## 2024-03-14 DIAGNOSIS — I7025 Atherosclerosis of native arteries of other extremities with ulceration: Secondary | ICD-10-CM

## 2024-03-14 NOTE — Telephone Encounter (Signed)
 Reached patient on home phone.  Informed patient that her VM was full.

## 2024-03-14 NOTE — Telephone Encounter (Signed)
 Attempted to call for surgery scheduling. Unable to leave VM.  MB full.

## 2024-03-19 ENCOUNTER — Ambulatory Visit (INDEPENDENT_AMBULATORY_CARE_PROVIDER_SITE_OTHER): Admitting: Podiatry

## 2024-03-19 ENCOUNTER — Encounter: Payer: Self-pay | Admitting: Podiatry

## 2024-03-19 DIAGNOSIS — I739 Peripheral vascular disease, unspecified: Secondary | ICD-10-CM

## 2024-03-19 DIAGNOSIS — L97512 Non-pressure chronic ulcer of other part of right foot with fat layer exposed: Secondary | ICD-10-CM

## 2024-03-19 NOTE — Progress Notes (Signed)
 Subjective:  Patient ID: Danielle Morton, female    DOB: [birth year],   MRN: [id number]  Chief Complaint  Patient presents with   Foot Ulcer    Ulcer lateral fifth metatarsal head. 0 pain. Non diabetic.    68 y.o. female presents for follow-up of wound on right foot. She has had ABI and severely diminished blood flow. She has had aortogram and planned to have endarterectomy and possible bypass on 7/8.  SABRA Denies any other pedal complaints. Denies n/v/f/c.   Past Medical History:  Diagnosis Date   Hypertension    PVD (peripheral vascular disease) (HCC)     Objective:  Physical Exam: Vascular: DP/PT pulses non palpable bilateral. CFT <5 seconds. Absent hair growth on digits. Edema noted to bilateral lower extremities. Xerosis noted bilaterally.  Skin. No lacerations or abrasions bilateral feet. Nails 1-5 bilateral  are thickened discolored and elongated with subungual debris. Hyperkeratotic lesion noted lateral fifth digit upon debridement open ulceration noted with mild erythema surrounding and edema.  Musculoskeletal: MMT 5/5 bilateral lower extremities in DF, PF, Inversion and Eversion. Deceased ROM in DF of ankle joint.  Neurological: Sensation intact to light touch. Protective sensation diminished bilateral.    +-------+-----------+-----------+------------+------------+  ABI/TBIToday's ABIToday's TBIPrevious ABIPrevious TBI  +-------+-----------+-----------+------------+------------+  Right 0.0        0.0                                  +-------+-----------+-----------+------------+------------+  Left  .55        .52                                  +-------+-----------+-----------+------------+------------+       TOES Findings:  +----------+---------------+--------+-------+  Right ToesPressure (mmHg)WaveformComment  +----------+---------------+--------+-------+  1st Digit                Absent            +----------+---------------+--------+-------+  2nd Digit                Absent           +----------+---------------+--------+-------+  3rd Digit                Absent           +----------+---------------+--------+-------+  4th Digit                Absent           +----------+---------------+--------+-------+  5th Digit                Absent           +----------+---------------+--------+-------+      +---------+---------------+--------+------------------+  Left ToesPressure (mmHg)WaveformComment             +---------+---------------+--------+------------------+  1st Digit               AbnormalSevere              +---------+---------------+--------+------------------+  2nd Digit               AbnormalModerate to Severe  +---------+---------------+--------+------------------+  3rd Digit               AbnormalModerate            +---------+---------------+--------+------------------+  4th Digit               AbnormalModerate to Severe  +---------+---------------+--------+------------------+  5th Digit               AbnormalSevere              +---------+---------------+--------+------------------+           Findings reported to Asberry Failing, DPM at 11:50 am.   Summary:  Right: Resting right ankle-brachial index indicates critical limb  ischemia. The right toe-brachial index is abnormal.   Left: Resting left ankle-brachial index indicates moderate left lower  extremity arterial disease. The left toe-brachial index is abnormal.   Assessment:   1. 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 Peripheral arterial disease (HCC)       Plan:  Ulcer lateral fifth metatarsal head with fat layer exposed  -X-rays reviewed no osseous erosions noted. Edema noted around ulceration site  -No debridement  No deep debridement concern for circulation.  -Dressed with betadine, DSD. -Off-loading with surgical shoe.  -No  additional antibiotics ordered today.  -ABIs reviewed. Has seen Dr. Sheree and aortogram preformed and she is scheduled for endarterectomy and possible bypass on 7/8.  -Discussed glucose control and proper protein-rich diet.  -Discussed if any worsening redness, pain, fever or chills to call or may need to report to the emergency room. Patient expressed understanding.   Return 2 weeks for wound check.   Return in about 2 weeks (around 04/02/2024) for wound check.    Asberry Failing, DPM

## 2024-03-19 NOTE — Pre-Procedure Instructions (Signed)
 Surgical Instructions   Your procedure is scheduled on Tuesday, July 8th. Report to [facility] Main Entrance A at 09:15 A.M., then check in with the Admitting office. Any questions or running late day of surgery: call [phone]  Questions prior to your surgery date: call [phone], Monday-Friday, 8am-4pm. If you experience any cold or flu symptoms such as cough, fever, chills, shortness of breath, etc. between now and your scheduled surgery, please notify us  at the above number.     Remember:  Do not eat or drink after midnight the night before your surgery     Take these medicines the morning of surgery with A SIP OF WATER  amLODipine  (NORVASC )  aspirin  EC  rosuvastatin  (CRESTOR )     One week prior to surgery, STOP taking any Aspirin  (unless otherwise instructed by your surgeon) Aleve, Naproxen, Ibuprofen, Motrin, Advil, Goody's, BC's, all herbal medications, fish oil, and non-prescription vitamins.                     Do NOT Smoke (Tobacco/Vaping) for 24 hours prior to your procedure.  If you use a CPAP at night, you may bring your mask/headgear for your overnight stay.   You will be asked to remove any contacts, glasses, piercing's, hearing aid's, dentures/partials prior to surgery. Please bring cases for these items if needed.    Patients discharged the day of surgery will not be allowed to drive home, and someone needs to stay with them for 24 hours.  SURGICAL WAITING ROOM VISITATION Patients may have no more than 2 support people in the waiting area - these visitors may rotate.   Pre-op nurse will coordinate an appropriate time for 1 ADULT support person, who may not rotate, to accompany patient in pre-op.  Children under the age of 32 must have an adult with them who is not the patient and must remain in the main waiting area with an adult.  If the patient needs to stay at the hospital during part of their recovery, the visitor guidelines for inpatient rooms  apply.  Please refer to the [facility] website for the visitor guidelines for any additional information.   If you received a COVID test during your pre-op visit  it is requested that you wear a mask when out in public, stay away from anyone that may not be feeling well and notify your surgeon if you develop symptoms. If you have been in contact with anyone that has tested positive in the last 10 days please notify you surgeon.      Pre-operative CHG Bathing Instructions   You can play a key role in reducing the risk of infection after surgery. Your skin needs to be as free of germs as possible. You can reduce the number of germs on your skin by washing with CHG (chlorhexidine  gluconate) soap before surgery. CHG is an antiseptic soap that kills germs and continues to kill germs even after washing.   DO NOT use if you have an allergy to chlorhexidine /CHG or antibacterial soaps. If your skin becomes reddened or irritated, stop using the CHG and notify one of our RNs at [phone].              TAKE A SHOWER THE NIGHT BEFORE SURGERY AND THE DAY OF SURGERY    Please keep in mind the following:  DO NOT shave, including legs and underarms, 48 hours prior to surgery.   You may shave your face before/day of surgery.  Place clean  sheets on your bed the night before surgery Use a clean washcloth (not used since being washed) for each shower. DO NOT sleep with pet's night before surgery.  CHG Shower Instructions:  Wash your face and private area with normal soap. If you choose to wash your hair, wash first with your normal shampoo.  After you use shampoo/soap, rinse your hair and body thoroughly to remove shampoo/soap residue.  Turn the water OFF and apply half the bottle of CHG soap to a CLEAN washcloth.  Apply CHG soap ONLY FROM YOUR NECK DOWN TO YOUR TOES (washing for 3-5 minutes)  DO NOT use CHG soap on face, private areas, open wounds, or sores.  Pay special attention to the area where  your surgery is being performed.  If you are having back surgery, having someone wash your back for you may be helpful. Wait 2 minutes after CHG soap is applied, then you may rinse off the CHG soap.  Pat dry with a clean towel  Put on clean pajamas    Additional instructions for the day of surgery: DO NOT APPLY any lotions, deodorants, cologne, or perfumes.   Do not wear jewelry or makeup Do not wear nail polish, gel polish, artificial nails, or any other type of covering on natural nails (fingers and toes) Do not bring valuables to the hospital. [facility] is not responsible for valuables/personal belongings. Put on clean/comfortable clothes.  Please brush your teeth.  Ask your nurse before applying any prescription medications to the skin.

## 2024-03-20 ENCOUNTER — Other Ambulatory Visit: Payer: Self-pay

## 2024-03-20 ENCOUNTER — Encounter (HOSPITAL_COMMUNITY): Payer: Self-pay

## 2024-03-20 ENCOUNTER — Encounter (HOSPITAL_COMMUNITY)
Admission: RE | Admit: 2024-03-20 | Discharge: 2024-03-20 | Disposition: A | Discharge status: Home / Self Care | Source: Ambulatory Visit | Attending: Vascular Surgery | Admitting: Vascular Surgery

## 2024-03-20 VITALS — BP 109/73 | HR 79 | Temp 98.7°F | Resp 16 | Ht 64.0 in | Wt 120.7 lb

## 2024-03-20 DIAGNOSIS — Z01818 Encounter for other preprocedural examination: Secondary | ICD-10-CM | POA: Diagnosis not present

## 2024-03-20 DIAGNOSIS — I7025 Atherosclerosis of native arteries of other extremities with ulceration: Secondary | ICD-10-CM | POA: Diagnosis not present

## 2024-03-20 HISTORY — DX: Vascular disorder of intestine, unspecified: K55.9

## 2024-03-20 LAB — COMPREHENSIVE METABOLIC PANEL WITH GFR
ALT: 14 U/L (ref 0–44)
AST: 21 U/L (ref 15–41)
Albumin: 4.1 g/dL (ref 3.5–5.0)
Alkaline Phosphatase: 74 U/L (ref 38–126)
Anion gap: 13 (ref 5–15)
BUN: 8 mg/dL (ref 8–23)
CO2: 28 mmol/L (ref 22–32)
Calcium: 9.8 mg/dL (ref 8.9–10.3)
Chloride: 97 mmol/L — ABNORMAL LOW (ref 98–111)
Creatinine, Ser: 0.66 mg/dL (ref 0.44–1.00)
GFR, Estimated: >60 mL/min (ref >60)
Glucose, Bld: 125 mg/dL — ABNORMAL HIGH (ref 70–99)
Potassium: 3.1 mmol/L — ABNORMAL LOW (ref 3.5–5.1)
Sodium: 138 mmol/L (ref 135–145)
Total Bilirubin: 0.5 mg/dL (ref 0.0–1.2)
Total Protein: 8.3 g/dL — ABNORMAL HIGH (ref 6.5–8.1)

## 2024-03-20 LAB — URINALYSIS, ROUTINE W REFLEX MICROSCOPIC
Bilirubin Urine: NEGATIVE
Glucose, UA: NEGATIVE mg/dL
Ketones, ur: NEGATIVE mg/dL
Nitrite: NEGATIVE
Protein, ur: NEGATIVE mg/dL
Specific Gravity, Urine: 1.008 (ref 1.005–1.030)
pH: 6 (ref 5.0–8.0)

## 2024-03-20 LAB — CBC
HCT: 40.6 % (ref 36.0–46.0)
Hemoglobin: 13.6 g/dL (ref 12.0–15.0)
MCH: 31.7 pg (ref 26.0–34.0)
MCHC: 33.5 g/dL (ref 30.0–36.0)
MCV: 94.6 fL (ref 80.0–100.0)
Platelets: 255 10*3/uL (ref 150–400)
RBC: 4.29 MIL/uL (ref 3.87–5.11)
RDW: 14 % (ref 11.5–15.5)
WBC: 7.5 10*3/uL (ref 4.0–10.5)
nRBC: 0 % (ref 0.0–0.2)

## 2024-03-20 LAB — SURGICAL PCR SCREEN
MRSA, PCR: NEGATIVE
Staphylococcus aureus: NEGATIVE

## 2024-03-20 LAB — PROTIME-INR
INR: 1.1 (ref 0.8–1.2)
Prothrombin Time: 14.4 s (ref 11.4–15.2)

## 2024-03-20 LAB — TYPE AND SCREEN
ABO/RH(D): A POS
Antibody Screen: NEGATIVE

## 2024-03-20 LAB — APTT: aPTT: 32 s (ref 24–36)

## 2024-03-20 MED ORDER — NITROFURANTOIN MONOHYD MACRO 100 MG PO CAPS
100.0000 mg | ORAL_CAPSULE | Freq: Two times a day (BID) | ORAL | 0 refills | Status: AC
  Filled 2024-03-20: qty 10, 5d supply, fill #0

## 2024-03-20 NOTE — Progress Notes (Signed)
 PCP - [name], NP Cardiologist - Dr. Dorn Lesches  PPM/ICD - denies   Chest x-ray - denies EKG - 03/12/24 Stress Test - 02/20/24 ECHO - denies Cardiac Cath - denies  Sleep Study - denies   DM- denies  Last dose of GLP1 agonist-  n/a   Blood Thinner Instructions: n/a Aspirin  Instructions: continue  ERAS Protcol - no, NPO   COVID TEST- n/a   Anesthesia review: yes, cardiac hx  Patient denies shortness of breath, fever, cough and chest pain at PAT appointment   All instructions explained to the patient, with a verbal understanding of the material. Patient agrees to go over the instructions while at home for a better understanding. The opportunity to ask questions was provided.

## 2024-03-20 NOTE — Progress Notes (Addendum)
 Notified pt that the surgeon's office is calling in a prescription for Macrobid for abnormal UA.

## 2024-03-21 ENCOUNTER — Encounter (HOSPITAL_COMMUNITY): Payer: Self-pay

## 2024-03-21 NOTE — Progress Notes (Signed)
 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 BLE with ulcer   [facility] RO[facility] / [facility]   Surgeons: Sheree Penne Bruckner, MD       DISCUSSION: Patient is a 68 year old female scheduled for the above procedure.  History includes former smoker (quit 03/21/19), HTN, PVD (s/p left CFA/profunda endarterectomies, left CIA stent 08/15/18), mesenteric ischemia (s/[percutaneous mechanical thrombectomy of SMA, angioplasty of distal SMA 12/28/21), elevated coronary calcium  score (1179, 99th percentile 03/22/22)  Most recent cardiology visit with Dr. Court was on 02/01/24. She had elevated CAD of 11/79 (99th percentile) in July 2023. A stress test was planned, but she never scheduled. At most recent visit she needed clearance for a colonoscopy. She denied CV symptoms. He ordered a Myoview  stress test which was done on 02/20/24 and showed normal LV perfusion without evidence for infarction or ischemia, LVEF 47%, intermediate risk due to mild LV dysfunction, consider echo to further assess LVF. In the interim, she was evaluated by Dr. Sheree with right femoral endarterectomy recommended. He did reach out to Dr. Court for preoperative input. Per 03/19/24 documentation by Channing Morones, LPN, Spoke to patient Dr.Berry advised low risk study.Echo is not needed before surgery.  Anesthesia team to evaluate on the day of surgery.    VS: BP 109/73   Pulse 79   Temp 37.1 C   Resp 16   Ht 5' 4 (1.626 m)   Wt 54.7 kg   SpO2 100%   BMI 20.72 kg/m   PROVIDERS: Celestia Rosaline SQUIBB, NP is PCP  Court Carrier, MD is cardiologist Sheree Penne, MD is vascular surgeon - Recent referral to [facility] on 01/12/24 for Lung Cancer Screening due to smoking history.   LABS: Preoperative labs noted. UA showed large leukocytes, negative nitrites. Surgeon's office prescribed Macrobid for abnormal UA. (all labs ordered are listed, but only abnormal results are displayed)  Labs Reviewed  COMPREHENSIVE METABOLIC PANEL WITH GFR - Abnormal; Notable for the following components:      Result Value   Potassium 3.1 (*)    Chloride 97 (*)    Glucose, Bld 125 (*)    Total Protein 8.3 (*)    All other components within normal limits  URINALYSIS, ROUTINE W REFLEX MICROSCOPIC - Abnormal; Notable for the following components:   Hgb urine dipstick SMALL (*)    Leukocytes,Ua LARGE (*)    Bacteria, UA RARE (*)    Non Squamous Epithelial 0-5 (*)    All other components within normal limits  SURGICAL PCR SCREEN  CBC  PROTIME-INR  APTT  TYPE AND SCREEN    EKG: 03/12/24: Sinus rhythm with marked sinus arrhythmia Left ventricular hypertrophy When compared with ECG of 12-Mar-2024 07:57, PREVIOUS ECG IS PRESENT No significant change since last tracing Confirmed by Cherrie Sieving [phone]) on 03/12/2024 10:18:43 AM   CV: Nuclear stress test 02/20/24:   ECG is abnormal. The ECG shows premature atrial contractions and premature ventricular contractions.   A pharmacological stress test was performed using IV Lexiscan  0.4mg  over 10 seconds performed without concurrent submaximal exercise. This patient vomitted several times with  the Lexiscan . Sx free after 5 minutes. vomitting. Normal blood pressure and normal heart rate response noted during stress. Heart rate recovery was normal.   Inferolateral T wave inversions were noted in recovery. ECG was interpretable and non-conclusive. The ECG was not diagnostic due to pharmacologic protocol.   LV perfusion is normal. There is no evidence of ischemia. There is no evidence of infarction.   Left  ventricular function is normal. Nuclear stress EF: 47%. The left ventricular ejection fraction is mildly decreased (45-54%). End diastolic cavity size is normal. End systolic cavity size is normal.   CT images were obtained for attenuation correction and were examined for the presence of coronary calcium  when appropriate.   Prior study not available for comparison.   Findings are consistent with no ischemia. The study is intermediate risk due to mild LV dysfunction.  .   Consider 2D echo for further assessment of LVF.  - Per 03/19/24 documentation by Channing Morones, LPN, Spoke to patient Dr.Berry advised low risk study.Echo is not needed before surgery [femoral endarterectomy].   US  Carotid 06/10/23: Summary:  - Right Carotid: Velocities in the right ICA are consistent with a 1-39%  stenosis. The ECA appears >50% stenosed.  - Left Carotid: Velocities in the left ICA are consistent with a 1-39%  stenosis.  - Vertebrals: Bilateral vertebral arteries demonstrate antegrade flow. High  velocity in the left vertebral.  - Subclavians: Normal flow hemodynamics were seen in bilateral subclavian  arteries.    CT Cardiac Calcium  Scoring 03/23/23: IMPRESSION: Coronary calcium  score of 1179. This was 99th percentile for age-, race-, and sex-matched controls. Aortic atherosclerosis.   Past Medical History:  Diagnosis Date   Elevated coronary artery calcium  score 03/22/2022   1179 (99th percentile)   Hypertension    Mesenteric ischemia (HCC)    PVD (peripheral vascular disease) (HCC)     Past Surgical History:  Procedure Laterality Date   ABDOMINAL AORTOGRAM W/LOWER EXTREMITY N/A 03/12/2024   Procedure: ABDOMINAL AORTOGRAM W/LOWER EXTREMITY;  Surgeon: Sheree Penne Bruckner, MD;  Location: [facility] INVASIVE CV LAB;  Service: Cardiovascular;  Laterality: N/A;   ANGIOPLASTY N/A 12/28/2021   Procedure: BALLOON ANGIOPLASTY OF SUPERIOR MESENTERIC ARTERY;  Surgeon: Lanis Fonda BRAVO, MD;  Location: [facility] OR;   Service: Vascular;  Laterality: N/A;   ENDARTERECTOMY FEMORAL Left 08/15/2018   Procedure: ENDARTERECTOMY COMMON FEMORAL;  Surgeon: Sheree Penne Bruckner, MD;  Location: [facility] OR;  Service: Vascular;  Laterality: Left;   INSERTION OF ILIAC STENT Left 08/15/2018   Procedure: INSERTION OF ILIAC STENT RETROGRADE;  Surgeon: Sheree Penne Bruckner, MD;  Location: [facility] OR;  Service: Vascular;  Laterality: Left;   LOWER EXTREMITY ANGIOGRAPHY N/A 08/14/2018   Procedure: LOWER EXTREMITY ANGIOGRAPHY;  Surgeon: Sheree Penne Bruckner, MD;  Location: [facility] INVASIVE CV LAB;  Service: Cardiovascular;  Laterality: N/A;   LOWER EXTREMITY ANGIOGRAPHY N/A 03/12/2024   Procedure: Lower Extremity Angiography;  Surgeon: Sheree Penne Bruckner, MD;  Location: [facility] INVASIVE CV LAB;  Service: Cardiovascular;  Laterality: N/A;   LOWER EXTREMITY INTERVENTION N/A 03/12/2024   Procedure: LOWER EXTREMITY INTERVENTION;  Surgeon: Sheree Penne Bruckner, MD;  Location: [facility] INVASIVE CV LAB;  Service: Cardiovascular;  Laterality: N/A;   MESENTERIC ARTERY BYPASS Left 12/28/2021   Procedure: MESENTERIC ANGIOGRAPHY FROM LEFT BRACHIAL ARTERY;  Surgeon: Lanis Fonda BRAVO, MD;  Location: [facility] OR;  Service: Vascular;  Laterality: Left;    MEDICATIONS:  amLODipine  (NORVASC ) 5 MG tablet   aspirin  EC 81 MG tablet   Evolocumab  (REPATHA  SURECLICK) 140  MG/ML SOAJ   hydrochlorothiazide  (HYDRODIURIL ) 25 MG tablet   nitrofurantoin, macrocrystal-monohydrate, (MACROBID) 100 MG capsule   rosuvastatin  (CRESTOR ) 40 MG tablet   No current facility-administered medications for this encounter.    Isaiah Ruder, PA-C Surgical Short Stay/Anesthesiology [facility] Phone [phone] [facility] Phone [phone] 03/21/2024 2:57 PM

## 2024-03-21 NOTE — Anesthesia Preprocedure Evaluation (Addendum)
 Anesthesia Evaluation  Patient identified by MRN, date of birth, ID band Patient awake    Reviewed: Allergy & Precautions, H&P , NPO status , Patient's Chart, lab work & pertinent test results  History of Anesthesia Complications Negative for: history of anesthetic complications  Airway Mallampati: II  TM Distance: >3 FB Neck ROM: Full    Dental no notable dental hx. (+)    Pulmonary neg pulmonary ROS, former smoker   Pulmonary exam normal breath sounds clear to auscultation       Cardiovascular hypertension, + CAD and + Peripheral Vascular Disease  negative cardio ROS Normal cardiovascular exam Rhythm:Regular Rate:Normal  Nuclear stress test 02/20/24:   ECG is abnormal. The ECG shows premature atrial contractions and premature ventricular contractions.   A pharmacological stress test was performed using IV Lexiscan  0.4mg  over 10 seconds performed without concurrent submaximal exercise. This patient vomitted several times with the Lexiscan . Sx free after 5 minutes. vomitting. Normal blood pressure and normal heart rate response noted during stress. Heart rate recovery was normal.   Inferolateral T wave inversions were noted in recovery. ECG was interpretable and non-conclusive. The ECG was not diagnostic due to pharmacologic protocol.   LV perfusion is normal. There is no evidence of ischemia. There is no evidence of infarction.   Left ventricular function is normal. Nuclear stress EF: 47%. The left ventricular ejection fraction is mildly decreased (45-54%). End diastolic cavity size is normal. End systolic cavity size is normal.   CT images were obtained for attenuation correction and were examined for the presence of coronary calcium  when appropriate.   Prior study not available for comparison.   Findings are consistent with no ischemia. The study is intermediate risk due to mild LV dysfunction.  .   Consider 2D echo for  further assessment of LVF. - Per 03/19/24 documentation by Channing Morones, LPN, Spoke to patient Dr.Berry advised low risk study.Echo is not needed before surgery [femoral endarterectomy].    Neuro/Psych negative neurological ROS  negative psych ROS   GI/Hepatic Neg liver ROS,,,Mesenteric ischemia   Endo/Other  negative endocrine ROS    Renal/GU negative Renal ROS  negative genitourinary   Musculoskeletal negative musculoskeletal ROS (+)    Abdominal   Peds negative pediatric ROS (+)  Hematology negative hematology ROS (+)   Anesthesia Other Findings   Reproductive/Obstetrics negative OB ROS                              Anesthesia Physical Anesthesia Plan  ASA: 3  Anesthesia Plan: General   Post-op Pain Management: Ofirmev  IV (intra-op)*   Induction: Intravenous  PONV Risk Score and Plan: 3 and Ondansetron , Dexamethasone , Midazolam  and Treatment may vary due to age or medical condition  Airway Management Planned: Oral ETT  Additional Equipment: Arterial line  Intra-op Plan:   Post-operative Plan: Extubation in OR  Informed Consent: I have reviewed the patients History and Physical, chart, labs and discussed the procedure including the risks, benefits and alternatives for the proposed anesthesia with the patient or authorized representative who has indicated his/her understanding and acceptance.     Dental advisory given  Plan Discussed with: CRNA  Anesthesia Plan Comments: (PAT note written 03/21/2024 by Allison Zelenak, PA-C.   Patient is a 68 year old female scheduled for the above procedure.   History includes former smoker (quit 03/21/19), HTN, PVD (s/p left CFA/profunda endarterectomies, left CIA stent 08/15/18), mesenteric ischemia (s/[percutaneous mechanical thrombectomy of SMA,  angioplasty of distal SMA 12/28/21), elevated coronary calcium  score (1179, 99th percentile 03/22/22)   Most recent cardiology visit with Dr. Court was  on 02/01/24. She had elevated CAD of 11/79 (99th percentile) in July 2023. A stress test was planned, but she never scheduled. At most recent visit she needed clearance for a colonoscopy. She denied CV symptoms. He ordered a Myoview  stress test which was done on 02/20/24 and showed normal LV perfusion without evidence for infarction or ischemia, LVEF 47%, intermediate risk due to mild LV dysfunction, consider echo to further assess LVF. In the interim, she was evaluated by Dr. Sheree with right femoral endarterectomy recommended. He did reach out to Dr. Court for preoperative input. Per 03/19/24 documentation by Channing Morones, LPN, Spoke to patient Dr.Berry advised low risk study.Echo is not needed before surgery. )         Anesthesia Quick Evaluation

## 2024-03-27 ENCOUNTER — Inpatient Hospital Stay (HOSPITAL_COMMUNITY)

## 2024-03-27 ENCOUNTER — Inpatient Hospital Stay (HOSPITAL_COMMUNITY)
Admission: RE | Admit: 2024-03-27 | Discharge: 2024-03-29 | DRG: 271 | Disposition: A | Discharge status: Home / Self Care | Attending: Vascular Surgery | Admitting: Vascular Surgery

## 2024-03-27 ENCOUNTER — Encounter (HOSPITAL_COMMUNITY)

## 2024-03-27 ENCOUNTER — Encounter (HOSPITAL_COMMUNITY): Payer: Self-pay | Admitting: Vascular Surgery

## 2024-03-27 ENCOUNTER — Other Ambulatory Visit: Payer: Self-pay

## 2024-03-27 ENCOUNTER — Encounter (HOSPITAL_COMMUNITY)
Admission: RE | Disposition: A | Discharge status: Home / Self Care | Payer: Self-pay | Source: Home / Self Care | Attending: Vascular Surgery

## 2024-03-27 ENCOUNTER — Inpatient Hospital Stay (HOSPITAL_COMMUNITY): Payer: Self-pay | Admitting: Vascular Surgery

## 2024-03-27 DIAGNOSIS — L97519 Non-pressure chronic ulcer of other part of right foot with unspecified severity: Secondary | ICD-10-CM | POA: Diagnosis present

## 2024-03-27 DIAGNOSIS — I10 Essential (primary) hypertension: Secondary | ICD-10-CM | POA: Diagnosis present

## 2024-03-27 DIAGNOSIS — I70235 Atherosclerosis of native arteries of right leg with ulceration of other part of foot: Secondary | ICD-10-CM | POA: Diagnosis present

## 2024-03-27 DIAGNOSIS — Z9889 Other specified postprocedural states: Secondary | ICD-10-CM

## 2024-03-27 DIAGNOSIS — I700 Atherosclerosis of aorta: Secondary | ICD-10-CM | POA: Diagnosis present

## 2024-03-27 DIAGNOSIS — I70221 Atherosclerosis of native arteries of extremities with rest pain, right leg: Secondary | ICD-10-CM | POA: Diagnosis present

## 2024-03-27 DIAGNOSIS — I70229 Atherosclerosis of native arteries of extremities with rest pain, unspecified extremity: Secondary | ICD-10-CM | POA: Diagnosis present

## 2024-03-27 DIAGNOSIS — I745 Embolism and thrombosis of iliac artery: Secondary | ICD-10-CM | POA: Diagnosis present

## 2024-03-27 DIAGNOSIS — I7025 Atherosclerosis of native arteries of other extremities with ulceration: Secondary | ICD-10-CM | POA: Diagnosis not present

## 2024-03-27 DIAGNOSIS — Z95820 Peripheral vascular angioplasty status with implants and grafts: Secondary | ICD-10-CM

## 2024-03-27 DIAGNOSIS — I7090 Unspecified atherosclerosis: Principal | ICD-10-CM | POA: Diagnosis present

## 2024-03-27 DIAGNOSIS — Z87891 Personal history of nicotine dependence: Secondary | ICD-10-CM

## 2024-03-27 DIAGNOSIS — I2510 Atherosclerotic heart disease of native coronary artery without angina pectoris: Secondary | ICD-10-CM | POA: Diagnosis not present

## 2024-03-27 DIAGNOSIS — Z7982 Long term (current) use of aspirin: Secondary | ICD-10-CM | POA: Diagnosis not present

## 2024-03-27 DIAGNOSIS — F17210 Nicotine dependence, cigarettes, uncomplicated: Secondary | ICD-10-CM | POA: Diagnosis present

## 2024-03-27 HISTORY — PX: ENDARTERECTOMY FEMORAL: SHX5804

## 2024-03-27 HISTORY — PX: LOWER EXTREMITY ANGIOGRAM: SHX5508

## 2024-03-27 HISTORY — PX: INSERTION OF ILIAC STENT: SHX6256

## 2024-03-27 LAB — CBC
HCT: 35.2 % — ABNORMAL LOW (ref 36.0–46.0)
Hemoglobin: 11.9 g/dL — ABNORMAL LOW (ref 12.0–15.0)
MCH: 31.5 pg (ref 26.0–34.0)
MCHC: 33.8 g/dL (ref 30.0–36.0)
MCV: 93.1 fL (ref 80.0–100.0)
Platelets: 222 K/uL (ref 150–400)
RBC: 3.78 MIL/uL — ABNORMAL LOW (ref 3.87–5.11)
RDW: 13.8 % (ref 11.5–15.5)
WBC: 13.7 K/uL — ABNORMAL HIGH (ref 4.0–10.5)
nRBC: 0 % (ref 0.0–0.2)

## 2024-03-27 LAB — CREATININE, SERUM
Creatinine, Ser: 0.53 mg/dL (ref 0.44–1.00)
GFR, Estimated: >60 mL/min (ref >60)

## 2024-03-27 SURGERY — ENDARTERECTOMY, FEMORAL
Anesthesia: General | Site: Groin | Laterality: Right

## 2024-03-27 MED ORDER — HEPARIN 6000 UNIT IRRIGATION SOLUTION
Status: AC
  Filled 2024-03-27: qty 500

## 2024-03-27 MED ORDER — DEXAMETHASONE SODIUM PHOSPHATE 10 MG/ML IJ SOLN
INTRAMUSCULAR | Status: DC | PRN
  Administered 2024-03-27: 10 mg via INTRAVENOUS

## 2024-03-27 MED ORDER — HEPARIN 6000 UNIT IRRIGATION SOLUTION
Status: DC | PRN
Start: 2024-03-27 — End: 2024-03-27
  Administered 2024-03-27: 1

## 2024-03-27 MED ORDER — MIDAZOLAM HCL 2 MG/2ML IJ SOLN
INTRAMUSCULAR | Status: AC
  Filled 2024-03-27: qty 2

## 2024-03-27 MED ORDER — POTASSIUM CHLORIDE CRYS ER 20 MEQ PO TBCR: 40.0000 meq | EXTENDED_RELEASE_TABLET | Freq: Every day | ORAL | Status: DC | PRN

## 2024-03-27 MED ORDER — LIDOCAINE 2% (20 MG/ML) 5 ML SYRINGE
INTRAMUSCULAR | Status: DC | PRN
  Administered 2024-03-27: 60 mg via INTRAVENOUS

## 2024-03-27 MED ORDER — IODIXANOL 320 MG/ML IV SOLN
INTRAVENOUS | Status: DC | PRN
Start: 2024-03-27 — End: 2024-03-27
  Administered 2024-03-27: 38 mL via INTRA_ARTERIAL

## 2024-03-27 MED ORDER — PROPOFOL 10 MG/ML IV BOLUS
INTRAVENOUS | Status: AC
  Filled 2024-03-27: qty 20

## 2024-03-27 MED ORDER — OXYCODONE-ACETAMINOPHEN 5-325 MG PO TABS
1.0000 | ORAL_TABLET | ORAL | Status: DC | PRN
  Administered 2024-03-28: 2 via ORAL
  Filled 2024-03-27: qty 2

## 2024-03-27 MED ORDER — PROPOFOL 10 MG/ML IV BOLUS
INTRAVENOUS | Status: DC | PRN
  Administered 2024-03-27: 30 mg via INTRAVENOUS
  Administered 2024-03-27: 20 mg via INTRAVENOUS
  Administered 2024-03-27: 100 mg via INTRAVENOUS

## 2024-03-27 MED ORDER — CLOPIDOGREL BISULFATE 75 MG PO TABS
75.0000 mg | ORAL_TABLET | Freq: Every day | ORAL | Status: DC
  Administered 2024-03-27 – 2024-03-29 (×3): 75 mg via ORAL
  Filled 2024-03-27 (×4): qty 1

## 2024-03-27 MED ORDER — LABETALOL HCL 5 MG/ML IV SOLN
INTRAVENOUS | Status: DC | PRN
  Administered 2024-03-27 (×2): 5 mg via INTRAVENOUS

## 2024-03-27 MED ORDER — METOPROLOL TARTRATE 5 MG/5ML IV SOLN: 2.5000 mg | INTRAVENOUS | Status: DC | PRN

## 2024-03-27 MED ORDER — ACETAMINOPHEN 650 MG RE SUPP: 325.0000 mg | RECTAL | Status: DC | PRN

## 2024-03-27 MED ORDER — HYDROMORPHONE HCL 1 MG/ML IJ SOLN: 0.5000 mg | INTRAMUSCULAR | Status: DC | PRN

## 2024-03-27 MED ORDER — SUGAMMADEX SODIUM 200 MG/2ML IV SOLN
INTRAVENOUS | Status: DC | PRN
  Administered 2024-03-27: 200 mg via INTRAVENOUS

## 2024-03-27 MED ORDER — HYDROMORPHONE HCL 1 MG/ML IJ SOLN
INTRAMUSCULAR | Status: DC | PRN
  Administered 2024-03-27 (×2): .25 mg via INTRAVENOUS

## 2024-03-27 MED ORDER — ONDANSETRON HCL 4 MG/2ML IJ SOLN
4.0000 mg | Freq: Four times a day (QID) | INTRAMUSCULAR | Status: DC | PRN
Start: 2024-03-27 — End: 2024-03-29

## 2024-03-27 MED ORDER — SODIUM CHLORIDE 0.9 % IV SOLN: INTRAVENOUS | Status: DC

## 2024-03-27 MED ORDER — HEPARIN SODIUM (PORCINE) 5000 UNIT/ML IJ SOLN
5000.0000 [IU] | Freq: Three times a day (TID) | INTRAMUSCULAR | Status: DC
  Administered 2024-03-28 – 2024-03-29 (×4): 5000 [IU] via SUBCUTANEOUS
  Filled 2024-03-27 (×4): qty 1

## 2024-03-27 MED ORDER — PROTAMINE SULFATE 10 MG/ML IV SOLN
INTRAVENOUS | Status: DC | PRN
  Administered 2024-03-27: 25 mg via INTRAVENOUS

## 2024-03-27 MED ORDER — POLYETHYLENE GLYCOL 3350 17 G PO PACK: 17.0000 g | PACK | Freq: Every day | ORAL | Status: DC | PRN

## 2024-03-27 MED ORDER — SUGAMMADEX SODIUM 200 MG/2ML IV SOLN
INTRAVENOUS | Status: AC
  Filled 2024-03-27: qty 2

## 2024-03-27 MED ORDER — AMLODIPINE BESYLATE 5 MG PO TABS
5.0000 mg | ORAL_TABLET | Freq: Every day | ORAL | Status: DC
  Administered 2024-03-27 – 2024-03-29 (×3): 5 mg via ORAL
  Filled 2024-03-27 (×3): qty 1

## 2024-03-27 MED ORDER — LACTATED RINGERS IV SOLN: INTRAVENOUS | Status: AC

## 2024-03-27 MED ORDER — BISACODYL 10 MG RE SUPP: 10.0000 mg | Freq: Every day | RECTAL | Status: DC | PRN

## 2024-03-27 MED ORDER — ASPIRIN 81 MG PO TBEC
81.0000 mg | DELAYED_RELEASE_TABLET | Freq: Every day | ORAL | Status: DC
  Administered 2024-03-27 – 2024-03-29 (×3): 81 mg via ORAL
  Filled 2024-03-27 (×3): qty 1

## 2024-03-27 MED ORDER — ONDANSETRON HCL 4 MG/2ML IJ SOLN
INTRAMUSCULAR | Status: DC | PRN
  Administered 2024-03-27: 4 mg via INTRAVENOUS

## 2024-03-27 MED ORDER — LACTATED RINGERS IV SOLN: INTRAVENOUS | Status: DC

## 2024-03-27 MED ORDER — PHENYLEPHRINE HCL-NACL 20-0.9 MG/250ML-% IV SOLN
INTRAVENOUS | Status: DC | PRN
  Administered 2024-03-27: 20 ug/min via INTRAVENOUS

## 2024-03-27 MED ORDER — DOCUSATE SODIUM 100 MG PO CAPS
100.0000 mg | ORAL_CAPSULE | Freq: Every day | ORAL | Status: DC
  Administered 2024-03-28 – 2024-03-29 (×2): 100 mg via ORAL
  Filled 2024-03-27 (×2): qty 1

## 2024-03-27 MED ORDER — HYDRALAZINE HCL 20 MG/ML IJ SOLN: 5.0000 mg | INTRAMUSCULAR | Status: DC | PRN

## 2024-03-27 MED ORDER — ROCURONIUM BROMIDE 10 MG/ML (PF) SYRINGE
PREFILLED_SYRINGE | INTRAVENOUS | Status: DC | PRN
  Administered 2024-03-27: 30 mg via INTRAVENOUS
  Administered 2024-03-27: 20 mg via INTRAVENOUS
  Administered 2024-03-27: 50 mg via INTRAVENOUS

## 2024-03-27 MED ORDER — MIDAZOLAM HCL 2 MG/2ML IJ SOLN
INTRAMUSCULAR | Status: DC | PRN
  Administered 2024-03-27: 2 mg via INTRAVENOUS

## 2024-03-27 MED ORDER — LABETALOL HCL 5 MG/ML IV SOLN
INTRAVENOUS | Status: AC
  Filled 2024-03-27: qty 4

## 2024-03-27 MED ORDER — CHLORHEXIDINE GLUCONATE CLOTH 2 % EX PADS: 6.0000 | MEDICATED_PAD | Freq: Once | CUTANEOUS | Status: DC

## 2024-03-27 MED ORDER — PHENOL 1.4 % MT LIQD: 1.0000 | OROMUCOSAL | Status: DC | PRN

## 2024-03-27 MED ORDER — FENTANYL CITRATE (PF) 250 MCG/5ML IJ SOLN
INTRAMUSCULAR | Status: AC
  Filled 2024-03-27: qty 5

## 2024-03-27 MED ORDER — ROSUVASTATIN CALCIUM 20 MG PO TABS
40.0000 mg | ORAL_TABLET | Freq: Every day | ORAL | Status: DC
  Administered 2024-03-27 – 2024-03-29 (×3): 40 mg via ORAL
  Filled 2024-03-27 (×3): qty 2

## 2024-03-27 MED ORDER — LIDOCAINE 2% (20 MG/ML) 5 ML SYRINGE
INTRAMUSCULAR | Status: AC
  Filled 2024-03-27: qty 5

## 2024-03-27 MED ORDER — SODIUM CHLORIDE (PF) 0.9 % IJ SOLN
INTRAMUSCULAR | Status: AC
Start: 2024-03-27 — End: 2024-03-27
  Filled 2024-03-27: qty 20

## 2024-03-27 MED ORDER — PHENYLEPHRINE 80 MCG/ML (10ML) SYRINGE FOR IV PUSH (FOR BLOOD PRESSURE SUPPORT)
PREFILLED_SYRINGE | INTRAVENOUS | Status: AC
  Filled 2024-03-27: qty 10

## 2024-03-27 MED ORDER — HYDROMORPHONE HCL 1 MG/ML IJ SOLN
INTRAMUSCULAR | Status: AC
  Filled 2024-03-27: qty 0.5

## 2024-03-27 MED ORDER — HEPARIN SODIUM (PORCINE) 1000 UNIT/ML IJ SOLN
INTRAMUSCULAR | Status: DC | PRN
Start: 2024-03-27 — End: 2024-03-27
  Administered 2024-03-27: 6000 [IU] via INTRAVENOUS

## 2024-03-27 MED ORDER — DEXAMETHASONE SODIUM PHOSPHATE 10 MG/ML IJ SOLN
INTRAMUSCULAR | Status: AC
  Filled 2024-03-27: qty 1

## 2024-03-27 MED ORDER — SODIUM CHLORIDE 0.9 % IV SOLN: 500.0000 mL | Freq: Once | INTRAVENOUS | Status: DC | PRN

## 2024-03-27 MED ORDER — CHLORHEXIDINE GLUCONATE 0.12 % MT SOLN: 15.0000 mL | Freq: Once | OROMUCOSAL | Status: AC

## 2024-03-27 MED ORDER — CEFAZOLIN SODIUM-DEXTROSE 2-4 GM/100ML-% IV SOLN
2.0000 g | INTRAVENOUS | Status: AC
  Administered 2024-03-27: 2 g via INTRAVENOUS
  Filled 2024-03-27: qty 100

## 2024-03-27 MED ORDER — CEFAZOLIN SODIUM-DEXTROSE 2-4 GM/100ML-% IV SOLN
2.0000 g | Freq: Three times a day (TID) | INTRAVENOUS | Status: AC
  Administered 2024-03-27 – 2024-03-28 (×2): 2 g via INTRAVENOUS
  Filled 2024-03-27 (×2): qty 100

## 2024-03-27 MED ORDER — ROCURONIUM BROMIDE 10 MG/ML (PF) SYRINGE
PREFILLED_SYRINGE | INTRAVENOUS | Status: AC
Start: 2024-03-27 — End: 2024-03-27
  Filled 2024-03-27: qty 10

## 2024-03-27 MED ORDER — HYDROCHLOROTHIAZIDE 25 MG PO TABS
25.0000 mg | ORAL_TABLET | Freq: Every day | ORAL | Status: DC
  Administered 2024-03-27 – 2024-03-29 (×3): 25 mg via ORAL
  Filled 2024-03-27 (×3): qty 1

## 2024-03-27 MED ORDER — 0.9 % SODIUM CHLORIDE (POUR BTL) OPTIME
TOPICAL | Status: DC | PRN
Start: 2024-03-27 — End: 2024-03-27
  Administered 2024-03-27: 1000 mL

## 2024-03-27 MED ORDER — FENTANYL CITRATE (PF) 250 MCG/5ML IJ SOLN
INTRAMUSCULAR | Status: DC | PRN
  Administered 2024-03-27: 100 ug via INTRAVENOUS
  Administered 2024-03-27: 75 ug via INTRAVENOUS
  Administered 2024-03-27: 25 ug via INTRAVENOUS
  Administered 2024-03-27: 50 ug via INTRAVENOUS

## 2024-03-27 MED ORDER — ORAL CARE MOUTH RINSE: 15.0000 mL | Freq: Once | OROMUCOSAL | Status: AC

## 2024-03-27 MED ORDER — LABETALOL HCL 5 MG/ML IV SOLN: 10.0000 mg | INTRAVENOUS | Status: DC | PRN

## 2024-03-27 MED ORDER — ACETAMINOPHEN 325 MG PO TABS: 325.0000 mg | ORAL_TABLET | ORAL | Status: DC | PRN

## 2024-03-27 MED ORDER — CHLORHEXIDINE GLUCONATE 0.12 % MT SOLN
OROMUCOSAL | Status: AC
  Administered 2024-03-27: 15 mL via OROMUCOSAL
  Filled 2024-03-27: qty 15

## 2024-03-27 MED ORDER — HEMOSTATIC AGENTS (NO CHARGE) OPTIME
TOPICAL | Status: DC | PRN
  Administered 2024-03-27: 1 via TOPICAL

## 2024-03-27 MED ORDER — ONDANSETRON HCL 4 MG/2ML IJ SOLN
INTRAMUSCULAR | Status: AC
  Filled 2024-03-27: qty 2

## 2024-03-27 SURGICAL SUPPLY — 79 items
BAG COUNTER SPONGE SURGICOUNT (BAG) ×3 IMPLANT
BAG SNAP BAND KOVER 36X36 (MISCELLANEOUS) ×3 IMPLANT
BALLOON MUSTANG 6X80X75 (BALLOONS) IMPLANT
BANDAGE ESMARK 6X9 LF (GAUZE/BANDAGES/DRESSINGS) IMPLANT
BLADE SURG 11 STRL SS (BLADE) ×3 IMPLANT
CANISTER SUCTION 3000ML PPV (SUCTIONS) ×3 IMPLANT
CANNULA VESSEL 3MM 2 BLNT TIP (CANNULA) IMPLANT
CATH ANGIO 5F BER2 65CM (CATHETERS) IMPLANT
CATH BEACON 5 .035 40 KMP TP (CATHETERS) IMPLANT
CATH BEACON 5 .035 65 KMP TIP (CATHETERS) IMPLANT
CATH EMB 3FR 40 (CATHETERS) IMPLANT
CATH EMB 4FR 40 (CATHETERS) IMPLANT
CATH OMNI FLUSH .035X70CM (CATHETERS) IMPLANT
CATH OMNI FLUSH 5F 65CM (CATHETERS) ×3 IMPLANT
CHLORAPREP W/TINT 26 (MISCELLANEOUS) ×3 IMPLANT
CLIP LIGATING EXTRA MED SLVR (CLIP) ×3 IMPLANT
CLIP LIGATING EXTRA SM BLUE (MISCELLANEOUS) ×3 IMPLANT
COVER DOME SNAP 22 D (MISCELLANEOUS) ×3 IMPLANT
COVER PROBE W GEL 5X96 (DRAPES) ×3 IMPLANT
COVER SURGICAL LIGHT HANDLE (MISCELLANEOUS) ×3 IMPLANT
DERMABOND ADVANCED .7 DNX12 (GAUZE/BANDAGES/DRESSINGS) ×6 IMPLANT
DEVICE TORQUE KENDALL .025-038 (MISCELLANEOUS) IMPLANT
DRAPE C-ARM 42X72 X-RAY (DRAPES) IMPLANT
DRAPE FEMORAL ANGIO 80X135IN (DRAPES) ×3 IMPLANT
DRAPE HALF SHEET 40X57 (DRAPES) IMPLANT
DRAPE X-RAY CASS 24X20 (DRAPES) IMPLANT
ELECTRODE REM PT RTRN 9FT ADLT (ELECTROSURGICAL) ×3 IMPLANT
EVACUATOR SILICONE 100CC (DRAIN) IMPLANT
GAUZE 4X4 16PLY ~~LOC~~+RFID DBL (SPONGE) ×3 IMPLANT
GLOVE BIO SURGEON STRL SZ7.5 (GLOVE) ×3 IMPLANT
GOWN STRL REUS W/ TWL LRG LVL3 (GOWN DISPOSABLE) ×6 IMPLANT
GOWN STRL REUS W/ TWL XL LVL3 (GOWN DISPOSABLE) ×3 IMPLANT
GUIDEWIRE ANGLED .035X150CM (WIRE) IMPLANT
HEMOSTAT SNOW SURGICEL 2X4 (HEMOSTASIS) IMPLANT
INSERT FOGARTY SM (MISCELLANEOUS) IMPLANT
KIT BASIN OR (CUSTOM PROCEDURE TRAY) ×3 IMPLANT
KIT ENCORE 26 ADVANTAGE (KITS) IMPLANT
KIT TURNOVER KIT B (KITS) ×3 IMPLANT
LOOP VESSEL MINI RED (MISCELLANEOUS) IMPLANT
NDL PERC 18GX7CM (NEEDLE) ×3 IMPLANT
NEEDLE PERC 18GX7CM (NEEDLE) ×2 IMPLANT
NS IRRIG 1000ML POUR BTL (IV SOLUTION) ×6 IMPLANT
PACK ENDO MINOR (CUSTOM PROCEDURE TRAY) ×3 IMPLANT
PACK PERIPHERAL VASCULAR (CUSTOM PROCEDURE TRAY) ×3 IMPLANT
PACK SRG BSC III STRL LF ECLPS (CUSTOM PROCEDURE TRAY) ×3 IMPLANT
PAD ARMBOARD POSITIONER FOAM (MISCELLANEOUS) ×6 IMPLANT
PATCH VASC XENOSURE 1X6 (Vascular Products) IMPLANT
POWDER SURGICEL 3.0 GRAM (HEMOSTASIS) IMPLANT
SET MICROPUNCTURE 5F STIFF (MISCELLANEOUS) ×3 IMPLANT
SHEATH AVANTI 11CM 5FR (SHEATH) IMPLANT
SHEATH BRITE TIP 6FR 35CM (SHEATH) IMPLANT
SHEATH PINNACLE 5F 10CM (SHEATH) IMPLANT
SHEATH PINNACLE 8F 10CM (SHEATH) ×3 IMPLANT
STATION PROTECTION PRESSURIZED (MISCELLANEOUS) ×3 IMPLANT
STENT ELUVIA 7X120X130 (Permanent Stent) IMPLANT
STENT VIABAHN 7X39 6FR 135 (Permanent Stent) IMPLANT
STOPCOCK 4 WAY LG BORE MALE ST (IV SETS) IMPLANT
STOPCOCK MORSE 400PSI 3WAY (MISCELLANEOUS) ×3 IMPLANT
SUT ETHILON 3 0 PS 1 (SUTURE) IMPLANT
SUT MNCRL AB 4-0 PS2 18 (SUTURE) ×6 IMPLANT
SUT PROLENE 5 0 C 1 24 (SUTURE) ×3 IMPLANT
SUT PROLENE 6 0 BV (SUTURE) ×3 IMPLANT
SUT SILK 2 0 SH (SUTURE) ×3 IMPLANT
SUT VIC AB 2-0 CT1 TAPERPNT 27 (SUTURE) ×6 IMPLANT
SUT VIC AB 3-0 SH 27X BRD (SUTURE) ×6 IMPLANT
SYR 10ML LL (SYRINGE) ×9 IMPLANT
SYR 20ML LL LF (SYRINGE) ×3 IMPLANT
SYR 30ML LL (SYRINGE) ×3 IMPLANT
SYR 3ML LL SCALE MARK (SYRINGE) IMPLANT
SYR MEDRAD MARK V 150ML (SYRINGE) ×3 IMPLANT
TOWEL GREEN STERILE (TOWEL DISPOSABLE) ×6 IMPLANT
TOWEL GREEN STERILE FF (TOWEL DISPOSABLE) ×3 IMPLANT
TRAY FOLEY MTR SLVR 16FR STAT (SET/KITS/TRAYS/PACK) ×3 IMPLANT
TUBING HIGH PRESSURE 120CM (CONNECTOR) ×3 IMPLANT
TUBING INJECTOR 48 (MISCELLANEOUS) IMPLANT
UNDERPAD 30X36 HEAVY ABSORB (UNDERPADS AND DIAPERS) ×3 IMPLANT
WATER STERILE IRR 1000ML POUR (IV SOLUTION) ×3 IMPLANT
WIRE BENTSON .035X145CM (WIRE) ×3 IMPLANT
WIRE ROSEN-J .035X260CM (WIRE) IMPLANT

## 2024-03-27 NOTE — Discharge Instructions (Signed)

## 2024-03-27 NOTE — H&P (Signed)
 HPI:   Danielle Morton is a 68 y.o. 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  She now has an ulcer on her right lateral foot.  She states that she no longer smokes.  She walks with the help of a cane.  She believes she takes aspirin  Plavix  these are not on her medication list.  She does take statin which is listed.  She does have continued numbness of the left leg no numbness or pain in the right leg other than where the wound is located.  She follows diligently with podiatry for wound care.       Past Medical History:  Diagnosis Date   Hypertension     PVD (peripheral vascular disease) (HCC)               Family History  Problem Relation Age of Onset   Colon cancer Neg Hx     Pancreatic cancer Neg Hx     Stomach cancer Neg Hx     Esophageal cancer Neg Hx               Past Surgical History:  Procedure Laterality Date   ANGIOPLASTY N/A 12/28/2021    Procedure: BALLOON ANGIOPLASTY OF SUPERIOR MESENTERIC ARTERY;  Surgeon: Lanis Fonda BRAVO, MD;  Location: [facility] OR;  Service: Vascular;  Laterality: N/A;   ENDARTERECTOMY FEMORAL Left 08/15/2018    Procedure: ENDARTERECTOMY COMMON FEMORAL;  Surgeon: Sheree Penne Bruckner, MD;  Location: [facility] OR;  Service: Vascular;  Laterality: Left;   INSERTION OF ILIAC STENT Left 08/15/2018    Procedure: INSERTION OF ILIAC STENT RETROGRADE;  Surgeon: Sheree Penne Bruckner, MD;  Location: [facility] OR;  Service: Vascular;  Laterality: Left;   LOWER EXTREMITY ANGIOGRAPHY N/A 08/14/2018    Procedure: LOWER EXTREMITY ANGIOGRAPHY;  Surgeon: Sheree Penne Bruckner, MD;  Location: [facility] INVASIVE CV LAB;  Service: Cardiovascular;  Laterality: N/A;   MESENTERIC ARTERY BYPASS Left 12/28/2021    Procedure: MESENTERIC ANGIOGRAPHY FROM LEFT BRACHIAL ARTERY;  Surgeon: Lanis Fonda BRAVO, MD;  Location: [facility] OR;  Service: Vascular;   Laterality: Left;          Short Social History:  Social History         Tobacco Use   Smoking status: Former      Current packs/day: 1.00      Average packs/day: 1 pack/day for 30.0 years (30.0 ttl pk-yrs)      Types: Cigarettes   Smokeless tobacco: Never  Substance Use Topics   Alcohol use: Not Currently      Alcohol/week: 3.0 standard drinks of alcohol      Types: 3 Cans of beer per week      Allergies  No Known Allergies           Current Outpatient Medications  Medication Sig Dispense Refill   amLODipine  (NORVASC ) 5 MG tablet Take 1 tablet (5 mg total) by mouth daily. 90 tablet 1   Evolocumab  (REPATHA  SURECLICK) 140 MG/ML SOAJ Inject 140 mg into the skin every 14 (fourteen) days. 6 mL 3   hydrochlorothiazide  (HYDRODIURIL ) 25 MG tablet Take 1 tablet (25 mg total) by mouth daily. 90 tablet 1   rosuvastatin  (CRESTOR ) 40 MG tablet Take 1 tablet (40 mg total) by mouth daily. 90 tablet 1   cephALEXin  (KEFLEX ) 500 MG capsule Take 1 capsule (500 mg total) by mouth 3 (three) times  daily. (Patient not taking: Reported on 03/05/2024) 30 capsule 2      No current facility-administered medications for this visit.        Review of Systems  Constitutional:  Constitutional negative. HENT: HENT negative.  Eyes: Eyes negative.  Respiratory: Respiratory negative.  Cardiovascular: Cardiovascular negative.  GI: Gastrointestinal negative.  Musculoskeletal: Musculoskeletal negative.  Skin: Positive for wound.  Neurological: Positive for numbness.  Hematologic: Hematologic/lymphatic negative.  Psychiatric: Psychiatric negative.          Objective:   Vitals:   03/27/24 0805  BP: (!) (P) 147/71  Pulse: (P) 71  Resp: (P) 16  Temp: (P) 98.3 F (36.8 C)  SpO2: (P) 97%    Physical Exam HENT:     Head: Normocephalic.     Nose: Nose normal.    Eyes:     Pupils: Pupils are equal, round, and reactive to light.    Neck:     Vascular: No carotid bruit.     Cardiovascular:     Rate and Rhythm: Rhythm irregular.     Pulses:          Radial pulses are 1+ on the right side and 0 on the left side.       Femoral pulses are 0 on the right side and 2+ on the left side. Pulmonary:     Effort: Pulmonary effort is normal.  Abdominal:     General: Abdomen is flat.    Musculoskeletal:     Right lower leg: No edema.     Left lower leg: No edema.    Neurological:     General: No focal deficit present.     Mental Status: She is alert.    Psychiatric:        Mood and Affect: Mood normal.        Thought Content: Thought content normal.     ABI Findings:  +---------+------------------+-----+--------+--------+  Right   Rt Pressure (mmHg)IndexWaveformComment   +---------+------------------+-----+--------+--------+  Brachial 170                                      +---------+------------------+-----+--------+--------+  PTA     0                 0.00 absent            +---------+------------------+-----+--------+--------+  PERO    0                 0.00 absent            +---------+------------------+-----+--------+--------+  DP      0                 0.00 absent            +---------+------------------+-----+--------+--------+  Great Toe0                 0.00 Absent            +---------+------------------+-----+--------+--------+   +---------+------------------+-----+----------+-------+  Left    Lt Pressure (mmHg)IndexWaveform  Comment  +---------+------------------+-----+----------+-------+  Brachial 162                                       +---------+------------------+-----+----------+-------+  PTA     94  0.55 monophasic         +---------+------------------+-----+----------+-------+  PERO    74                0.44 monophasic         +---------+------------------+-----+----------+-------+  DP      94                0.55 monophasic          +---------+------------------+-----+----------+-------+  Great Toe88                0.52 Abnormal           +---------+------------------+-----+----------+-------+   +-------+-----------+-----------+------------+------------+  ABI/TBIToday's ABIToday's TBIPrevious ABIPrevious TBI  +-------+-----------+-----------+------------+------------+  Right 0.0        0.0                                  +-------+-----------+-----------+------------+------------+  Left  .55        .52                                  +-------+-----------+-----------+------------+------------+       TOES Findings:  +----------+---------------+--------+-------+  Right ToesPressure (mmHg)WaveformComment  +----------+---------------+--------+-------+  1st Digit                Absent           +----------+---------------+--------+-------+  2nd Digit                Absent           +----------+---------------+--------+-------+  3rd Digit                Absent           +----------+---------------+--------+-------+  4th Digit                Absent           +----------+---------------+--------+-------+  5th Digit                Absent           +----------+---------------+--------+-------+      +---------+---------------+--------+------------------+  Left ToesPressure (mmHg)WaveformComment             +---------+---------------+--------+------------------+  1st Digit               AbnormalSevere              +---------+---------------+--------+------------------+  2nd Digit               AbnormalModerate to Severe  +---------+---------------+--------+------------------+  3rd Digit               AbnormalModerate            +---------+---------------+--------+------------------+  4th Digit               AbnormalModerate to Severe  +---------+---------------+--------+------------------+  5th Digit               AbnormalSevere               +---------+---------------+--------+------------------+   Summary:  Right: Resting right ankle-brachial index indicates critical limb  ischemia. The right toe-brachial index is abnormal.   Left: Resting left ankle-brachial index indicates moderate left lower  extremity arterial disease. The left toe-brachial index is abnormal.       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  We have again discussed that this represents a limb threatening situation.  Will plan for right common femoral endarterectomy with retrograde stenting in the OR today and right lower extremity angiography with possible bypass.  Risk benefits and alternatives were discussed and she demonstrates good understanding.     Babacar Haycraft C. Sheree, MD [facility] [place] Office: [phone] Pager: [phone]

## 2024-03-27 NOTE — Op Note (Addendum)
 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 Sheree, MD Assistant: Lucie Apt, PA Procedure Performed: 1.  Extensive right iliofemoral endarterectomy including external iliac, common femoral, medial circumflex and profunda femoral arteries and superficial femoral artery with bovine pericardial patch angioplasty 2.  Catheter selection of aorta and aortogram 3.  Stent of right common iliac artery with 7 x 39 mm VBX and stent of right external iliac artery with 7 x 120 mm VBX postdilated with 6 mm balloon 4.  Right lower extremity angiogram  Indications: 68 year old female with history of left common femoral endarterectomy with left common iliac artery stenting for rest pain.  She now has small ulceration of the right lateral mall toe but without evidence of osteomyelitis.  She has undergone angiography and appears to have multiple level disease with extensive external iliac and common femoral disease on the right without any visible reconstitution of the vessels distally.  She is now indicated for endarterectomy with retrograde stenting and right lower extremity angiography with possible bypass.  Experience assistant was necessary to facilitate exposure of the common femoral artery as well as the inflow and outflow vessels and performing extensive endarterectomy from the external iliac artery all the way down to the SFA and medial circumflex and profundofemoral branches as well as performing bovine pericardial patch angioplasty.  Findings: There was initially only minimal pulsatility in the common femoral artery which was heavily calcified.  Common femoral artery itself appeared to be subtotally occluded.  We established backbleeding from the medial circumflex and profunda branches but no backbleeding from the  superficial femoral artery although this was endarterectomized for a total of approximately 3 to 4 cm.  Under the inguinal ligament we performed extensive endarterectomy of the external iliac artery to establish much stronger inflow however this was still not as strong as the pulse on the left side.  We then performed aortogram which demonstrated heavily calcified aorta with narrowing of the lumen with a patent left common iliac artery stent.  The right side had disease of the right common iliac artery the hypogastric artery was occluded after approximately 1 cm in the external iliac artery had a 90% stenosis.  After stenting he has 0% residual stenosis in the common and external iliac arteries on the right and there was brisk inflow to the common femoral artery.  By angiography the common femoral artery was patent with patency of the medial circumflex and profunda branches.  She reconstitutes a diminutive popliteal artery at the knee and then has dominant runoff via the anterior tibial artery with a patent but diseased peroneal artery all the way to the foot as well with disadvantage flow into the right foot.  As such a bypass was not performed and patient could be considered for SFA stenting versus bypass if she does not heal her wound.   Procedure:  The patient was identified in the holding area and taken to the operating room where she was placed supine operative table and general anesthesia induced.  She was sterilely prepped and draped in the left groin and right lower extremity in usual fashion, antibiotics were administered a timeout was called.  We began with vertical incision in the left groin over the palpable common femoral artery although there was no palpable femoral pulse pulmonary calcified vessel.  We dissected down through the skin and subcutaneous tissue identify  the common femoral artery.  We dissected up onto the inguinal ligament and divided the crossing vein.  We encircled the extrailiac  artery where it was much softer under the inguinal ligament and we also encircled branch vessels.  We dissected down to the medial circumflex and profunda and these were encircled with Vesseloops and the SFA was dissected free for several centimeters and the patient was fully heparinized.  We then cinched Vesseloops and then opened the common femoral artery longitudinally initially with 11 blade followed by Potts scissors.  I then passed the 4 Fogarty proximally up to 10 cm for proximal control and hemostasis and then distally we ended up clamping the medial circumflex and profunda.  Extensive endarterectomy was performed including into the external iliac artery several centimeters above the arteriotomy and above the inguinal ligament.  Endarterectomy was performed to the medial circumflex and profunda as well as the proximal 3 to 4 cm of the SFA.  After endarterectomy was complete we then removed the 4 Fogarty and clamped the extrailiac artery and perform patch angioplasty with bovine pericardium.  Prior completion we performed flushing maneuvers and all directions.  Upon completion there was much improved pulsatility into the common femoral artery however it was not as strong as the contralateral left side.  The patch was then cannulated with a micropuncture needle in the distal aspect of the patch followed by wire and the sheath.  Under fluoroscopic guidance I was able to get a J-wire into the aorta and then placed a 5 French sheath and aortogram was performed.  I then placed the Catheter into the aorta and performed aortogram which demonstrated patency of the left common iliac artery stent.  There was significant disease in the right common iliac artery and subtotal occlusion of the external iliac artery on the right.  We then exchanged for a Rosen wire and then placed a 6 [place] sheath.  I initially stented from distal to proximal using a 7 x 120 mm Eluvia followed by 7 x 39 mm VBX at the aortic bifurcation.   Eluvia then was postdilated with 6 mm balloon.  Completion demonstrated brisk flow through the common and external iliac arteries on the right and there was a very strong pulse in the groin.  With this however then retracted the sheath down to the common femoral artery and perform right lower extremity angiography and elected for no further intervention and the sheath was removed and the common femoral artery clamped and arteriotomy repaired with 5-0 Prolene suture.  Protamine  was administered to a total of 25 mg and she tolerated this well.  We obtained meticulous hemostasis in the groin and irrigated and closed in layers with Vicryl Monocryl.  Dermabond was placed at the skin level and then she was awakened from anesthesia having tolerated the procedure well any complication.  All counts were correct at completion.  EBL: 150 cc  Contrast: 38 cc     Yasenia Reedy C. Sheree, MD [facility] [place] Office: [phone] Pager: [phone]

## 2024-03-27 NOTE — Anesthesia Postprocedure Evaluation (Signed)
 Anesthesia Post Note  Patient: Danielle Morton  Procedure(s) Performed: ENDARTERECTOMY, FEMORAL WITH PATCH ANGIOPLASTY (Right: Groin) ANGIOGRAM, LOWER EXTREMITY (Right) INSERTION, STENT, ARTERY, COMMON AND EXTERNAL ILIAC (Right: Groin)     Patient location during evaluation: PACU Anesthesia Type: General Level of consciousness: awake and alert Pain management: pain level controlled Vital Signs Assessment: post-procedure vital signs reviewed and stable Respiratory status: spontaneous breathing, nonlabored ventilation, respiratory function stable and patient connected to nasal cannula oxygen Cardiovascular status: blood pressure returned to baseline and stable Postop Assessment: no apparent nausea or vomiting Anesthetic complications: no   No notable events documented.  Last Vitals:  Vitals:   03/27/24 1515 03/27/24 1535  BP: (!) 146/83 (!) 154/85  Pulse: 65 (!) 58  Resp: 19 18  Temp:  (!) 36.4 C  SpO2: 94% 97%    Last Pain:  Vitals:   03/27/24 1535  TempSrc: Oral  PainSc:                  [name]

## 2024-03-27 NOTE — Transfer of Care (Signed)
 Immediate Anesthesia Transfer of Care Note  Patient: Danielle Morton  Procedure(s) Performed: ENDARTERECTOMY, FEMORAL WITH PATCH ANGIOPLASTY (Right: Groin) ANGIOGRAM, LOWER EXTREMITY (Right) INSERTION, STENT, ARTERY, COMMON AND EXTERNAL ILIAC (Right: Groin)  Patient Location: PACU  Anesthesia Type:General  Level of Consciousness: awake and patient cooperative  Airway & Oxygen Therapy: Patient Spontanous Breathing and Patient connected to face mask oxygen  Post-op Assessment: Report given to RN, Post -op Vital signs reviewed and stable, and Patient moving all extremities  Post vital signs: Reviewed and stable  Last Vitals:  Vitals Value Taken Time  BP 148/86 03/27/24 14:09  Temp    Pulse 75 03/27/24 14:11  Resp 23 03/27/24 14:11  SpO2 92 % 03/27/24 14:11  Vitals shown include unfiled device data.  Last Pain:  Vitals:   03/27/24 0815  TempSrc:   PainSc: 0-No pain         Complications: No notable events documented.

## 2024-03-27 NOTE — Progress Notes (Signed)
 Pt received from PACU, V/S obtained, CCMD notified, CHG bath given, all needs met, call bell in reach.    03/27/24 1535  Vitals  Temp (!) 97.5 F (36.4 C)  Temp Source Oral  BP (!) 154/85  MAP (mmHg) 107  BP Location Right Arm  BP Method Automatic  Patient Position (if appropriate) Lying  Pulse Rate (!) 58  Pulse Rate Source Monitor  ECG Heart Rate (!) 57  Resp 18  Level of Consciousness  Level of Consciousness Alert  MEWS COLOR  MEWS Score Color Green  Oxygen Therapy  SpO2 97 %  O2 Device Room Air  Art Line  Arterial Line BP 158/70  Arterial Line MAP (mmHg) 102 mmHg  MEWS Score  MEWS Temp 0  MEWS Systolic 0  MEWS Pulse 0  MEWS RR 0  MEWS LOC 0  MEWS Score 0

## 2024-03-27 NOTE — Anesthesia Procedure Notes (Signed)
 Arterial Line Insertion Start/End7/04/2024 10:35 AM, 03/27/2024 10:45 AM Performed by: Erma Thom SAUNDERS, MD  Patient location: Pre-op. Preanesthetic checklist: patient identified, IV checked, site marked, risks and benefits discussed, surgical consent, monitors and equipment checked, pre-op evaluation, timeout performed and anesthesia consent Lidocaine  1% used for infiltration Right, radial was placed Catheter size: 20 G Hand hygiene performed  and Seldinger technique used Allen's test indicative of satisfactory collateral circulation Attempts: 2 Procedure performed using ultrasound guided technique. Ultrasound Notes:anatomy identified, needle tip was noted to be adjacent to the nerve/plexus identified and no ultrasound evidence of intravascular and/or intraneural injection Following insertion, dressing applied. Post procedure assessment: normal and unchanged  Patient tolerated the procedure well with no immediate complications. Additional procedure comments: Turner, SRNA performed procedure.SABRA

## 2024-03-27 NOTE — Anesthesia Procedure Notes (Addendum)
 Procedure Name: Intubation Date/Time: 03/27/2024 11:02 AM  Performed by: Shlomo Tinnie SAILOR, RNPre-anesthesia Checklist: Patient identified, Emergency Drugs available, Suction available and Patient being monitored Patient Re-evaluated:Patient Re-evaluated prior to induction Oxygen Delivery Method: Circle System Utilized Preoxygenation: Pre-oxygenation with 100% oxygen Induction Type: IV induction Ventilation: Mask ventilation without difficulty and Oral airway inserted - appropriate to patient size Laryngoscope Size: Mac and 3 Grade View: Grade I Tube type: Oral Tube size: 7.0 mm Number of attempts: 1 Airway Equipment and Method: Stylet and Oral airway Placement Confirmation: ETT inserted through vocal cords under direct vision, positive ETCO2 and breath sounds checked- equal and bilateral Secured at: 22 cm Tube secured with: Tape Dental Injury: Teeth and Oropharynx as per pre-operative assessment

## 2024-03-28 ENCOUNTER — Encounter (HOSPITAL_COMMUNITY): Payer: Self-pay | Admitting: Vascular Surgery

## 2024-03-28 LAB — BASIC METABOLIC PANEL WITH GFR
Anion gap: 9 (ref 5–15)
BUN: 7 mg/dL — ABNORMAL LOW (ref 8–23)
CO2: 28 mmol/L (ref 22–32)
Calcium: 9.1 mg/dL (ref 8.9–10.3)
Chloride: 99 mmol/L (ref 98–111)
Creatinine, Ser: 0.55 mg/dL (ref 0.44–1.00)
GFR, Estimated: >60 mL/min (ref >60)
Glucose, Bld: 90 mg/dL (ref 70–99)
Potassium: 2.9 mmol/L — ABNORMAL LOW (ref 3.5–5.1)
Sodium: 136 mmol/L (ref 135–145)

## 2024-03-28 LAB — CBC
HCT: 34.7 % — ABNORMAL LOW (ref 36.0–46.0)
Hemoglobin: 11.7 g/dL — ABNORMAL LOW (ref 12.0–15.0)
MCH: 31 pg (ref 26.0–34.0)
MCHC: 33.7 g/dL (ref 30.0–36.0)
MCV: 91.8 fL (ref 80.0–100.0)
Platelets: 236 K/uL (ref 150–400)
RBC: 3.78 MIL/uL — ABNORMAL LOW (ref 3.87–5.11)
RDW: 13.9 % (ref 11.5–15.5)
WBC: 13.2 K/uL — ABNORMAL HIGH (ref 4.0–10.5)
nRBC: 0 % (ref 0.0–0.2)

## 2024-03-28 LAB — LIPID PANEL
Cholesterol: 74 mg/dL (ref 0–200)
HDL: 29 mg/dL — ABNORMAL LOW (ref >40)
LDL Cholesterol: 37 mg/dL (ref 0–99)
Total CHOL/HDL Ratio: 2.6 ratio
Triglycerides: 42 mg/dL (ref <150)
VLDL: 8 mg/dL (ref 0–40)

## 2024-03-28 LAB — POCT ACTIVATED CLOTTING TIME
Activated Clotting Time: 326 s
Activated Clotting Time: 256 s

## 2024-03-28 NOTE — Plan of Care (Signed)
  Problem: Education: Goal: Knowledge of General Education information will improve Description: Including pain rating scale, medication(s)/side effects and non-pharmacologic comfort measures Outcome: Progressing   Problem: Clinical Measurements: Goal: Will remain free from infection Outcome: Progressing Goal: Diagnostic test results will improve Outcome: Progressing   Problem: Activity: Goal: Risk for activity intolerance will decrease Outcome: Progressing   Problem: Nutrition: Goal: Adequate nutrition will be maintained Outcome: Progressing   Problem: Pain Managment: Goal: General experience of comfort will improve and/or be controlled Outcome: Progressing

## 2024-03-28 NOTE — Progress Notes (Signed)
 Mobility Specialist Progress Note:    03/28/24 1050  Mobility  Activity Ambulated with assistance to bathroom  Level of Assistance Standby assist, set-up cues, supervision of patient - no hands on  Assistive Device Cane  Distance Ambulated (ft) 25 ft  Activity Response Tolerated well  Mobility Referral Yes  Mobility visit 1 Mobility  Mobility Specialist Start Time (ACUTE ONLY) 1050  Mobility Specialist Stop Time (ACUTE ONLY) 1055  Mobility Specialist Time Calculation (min) (ACUTE ONLY) 5 min   Pt received in bed, requesting assistance to bathroom. Required SB with Cane for safety. Tolerated well, asx throughout. Void successful. Returned pt back to bed with all needs met.   Daksh Coates Mobility Specialist Please contact via Special educational needs teacher or  Rehab office at [phone]

## 2024-03-28 NOTE — Progress Notes (Signed)
 PT Discharge Note  Patient Details Name: Danielle Morton MRN: [id number] DOB: 1956-09-04   Cancelled Treatment:    Reason Eval/Treat Not Completed: OT screened, no needs identified, will sign off;Other (comment) (spoke with pt and pt reports she is moving at baseline. She has minimal pain and is very happy with per procedure. Will sign off at this time. Please re-consult if further needs arise.)  Dorothyann Maier, DPT, CLT  Acute Rehabilitation Services Office: [phone] (Secure chat preferred)   Dorothyann VEAR Maier 03/28/2024, 12:07 PM

## 2024-03-28 NOTE — Evaluation (Signed)
 Occupational Therapy Evaluation & Discharge Patient Details Name: Danielle Morton MRN: [id number] DOB: 08/07/1956 Today's Date: 03/28/2024   History of Present Illness   Pt is a 68 y.o female admitted 7/8 for scheduled R  femoral endarterectomy. PMH: s/p L  femoral ectomy Nov 2019, PVD, HTN     Clinical Impressions Pt admitted based on above, and was seen based on problem list below. PTA pt was independent with ADLs, receiving assistance with heavy duty IADLs. Today pt is  mod I for ADLs and functional mobility with use of quad cane. Pt mobilizing in hallway >181ft, no SOB, no LOB. Standing ADLs completed with VSS on RA. Pt reporting mobility improvement post-op. No follow up OT or DME needs. All education complete, no further acute OT needs.        If plan is discharge home, recommend the following:   Assistance with cooking/housework     Functional Status Assessment   Patient has not had a recent decline in their functional status     Equipment Recommendations   None recommended by OT      Precautions/Restrictions   Precautions Precautions: Fall Recall of Precautions/Restrictions: Intact Restrictions Weight Bearing Restrictions Per Provider Order: No     Mobility Bed Mobility Overal bed mobility: Modified Independent       General bed mobility comments: HOB minimally elevated    Transfers Overall transfer level: Modified independent Equipment used: Quad cane     General transfer comment: Use of quad cane for hallway mobility >15ft no LOB      Balance Overall balance assessment: Mild deficits observed, not formally tested       ADL either performed or assessed with clinical judgement   ADL Overall ADL's : Modified independent;At baseline         General ADL Comments: No assist, no SOB     Vision Baseline Vision/History: 1 Wears glasses Patient Visual Report: No change from baseline Vision Assessment?: No apparent visual deficits;Wears  glasses for reading            Pertinent Vitals/Pain Pain Assessment Pain Assessment: No/denies pain     Extremity/Trunk Assessment Upper Extremity Assessment Upper Extremity Assessment: Overall WFL for tasks assessed   Lower Extremity Assessment Lower Extremity Assessment: Overall WFL for tasks assessed   Cervical / Trunk Assessment Cervical / Trunk Assessment: Normal   Communication Communication Communication: No apparent difficulties   Cognition Arousal: Alert Behavior During Therapy: WFL for tasks assessed/performed Cognition: No apparent impairments     Following commands: Intact       Cueing  General Comments   Cueing Techniques: Verbal cues  HR elevated 132 bpm with mobility           Home Living Family/patient expects to be discharged to:: Private residence Living Arrangements: Other relatives;Non-relatives/Friends (Sister) Available Help at Discharge: Family;Friend(s);Available 24 hours/day Type of Home: Apartment Home Access: Ramped entrance     Home Layout: Two level Alternate Level Stairs-Number of Steps: flight w/ landing Alternate Level Stairs-Rails: Can reach both Bathroom Shower/Tub: Chief Strategy Officer: Standard Bathroom Accessibility: Yes How Accessible: Accessible via walker Home Equipment: Cane - quad;Rolling Walker (2 wheels);Tub bench          Prior Functioning/Environment Prior Level of Function : Independent/Modified Independent       Mobility Comments: Using quad cane prn assistance of sister's w/c ADLs Comments: Friend assists with IADLs    OT Problem List: Cardiopulmonary status limiting activity  OT Goals(Current goals can be found in the care plan section)   Acute Rehab OT Goals Patient Stated Goal: To go home OT Goal Formulation: All assessment and education complete, DC therapy Time For Goal Achievement: 04/11/24 Potential to Achieve Goals: Good   AM-PAC OT 6 Clicks Daily Activity      Outcome Measure Help from another person eating meals?: None Help from another person taking care of personal grooming?: None Help from another person toileting, which includes using toliet, bedpan, or urinal?: None Help from another person bathing (including washing, rinsing, drying)?: None Help from another person to put on and taking off regular upper body clothing?: None Help from another person to put on and taking off regular lower body clothing?: None 6 Click Score: 24   End of Session Equipment Utilized During Treatment: Gait belt;Other (comment) (Quad cane) Nurse Communication: Mobility status  Activity Tolerance: Patient tolerated treatment well Patient left: in bed;with call bell/phone within reach  OT Visit Diagnosis: Other abnormalities of gait and mobility (R26.89)                Time: 9082-9063 OT Time Calculation (min): 19 min Charges:  OT General Charges $OT Visit: 1 Visit OT Evaluation $OT Eval Low Complexity: 1 Low  Adrianne BROCKS, OT  Acute Rehabilitation Services Office [phone] Secure chat preferred   Adrianne GORMAN Savers 03/28/2024, 11:49 AM

## 2024-03-28 NOTE — Plan of Care (Signed)
  Problem: Education: Goal: Knowledge of General Education information will improve Description: Including pain rating scale, medication(s)/side effects and non-pharmacologic comfort measures 03/28/2024 0745 by Dorrine Shasta SAUNDERS, RN Outcome: Progressing 03/28/2024 0744 by Dorrine Shasta SAUNDERS, RN Outcome: Progressing   Problem: Clinical Measurements: Goal: Will remain free from infection Outcome: Progressing Goal: Diagnostic test results will improve Outcome: Progressing   Problem: Activity: Goal: Risk for activity intolerance will decrease 03/28/2024 0745 by Dorrine Shasta SAUNDERS, RN Outcome: Progressing 03/28/2024 0744 by Dorrine Shasta SAUNDERS, RN Outcome: Progressing   Problem: Nutrition: Goal: Adequate nutrition will be maintained Outcome: Progressing   Problem: Pain Managment: Goal: General experience of comfort will improve and/or be controlled 03/28/2024 0745 by Dorrine Shasta SAUNDERS, RN Outcome: Progressing 03/28/2024 0744 by Dorrine Shasta SAUNDERS, RN Outcome: Progressing   Problem: Safety: Goal: Ability to remain free from injury will improve Outcome: Progressing   Problem: Education: Goal: Knowledge of prescribed regimen will improve Outcome: Progressing

## 2024-03-28 NOTE — Progress Notes (Addendum)
  Progress Note    03/28/2024 8:41 AM 1 Day Post-Op  Subjective:  she is doing well this morning without any complaints. Denies any pain. Says her right foot feels much better    Vitals:   03/28/24 0357 03/28/24 0753  BP: 139/87 (!) 148/75  Pulse: 60 66  Resp: 20 14  Temp: 98.3 F (36.8 C) 98.3 F (36.8 C)  SpO2: 98% 98%    Physical Exam: General:  resting, NAD Cardiac:  regular Lungs:  nonlabored Incisions:  right groin incision intact and soft without hematoma Extremities:  right foot is warm with brisk DP/PT doppler signals   CBC    Component Value Date/Time   WBC 13.2 (H) 03/28/2024 0432   RBC 3.78 (L) 03/28/2024 0432   HGB 11.7 (L) 03/28/2024 0432   HGB 13.4 01/12/2024 1038   HCT 34.7 (L) 03/28/2024 0432   HCT 39.8 01/12/2024 1038   PLT 236 03/28/2024 0432   PLT 277 01/12/2024 1038   MCV 91.8 03/28/2024 0432   MCV 95 01/12/2024 1038   MCH 31.0 03/28/2024 0432   MCHC 33.7 03/28/2024 0432   RDW 13.9 03/28/2024 0432   RDW 14.4 01/12/2024 1038   LYMPHSABS 3.6 (H) 01/12/2024 1038   MONOABS 0.6 08/14/2018 0207   EOSABS 0.0 01/12/2024 1038   BASOSABS 0.0 01/12/2024 1038    BMET    Component Value Date/Time   NA 136 03/28/2024 0432   NA 140 01/12/2024 1038   K 2.9 (L) 03/28/2024 0432   CL 99 03/28/2024 0432   CO2 28 03/28/2024 0432   GLUCOSE 90 03/28/2024 0432   BUN 7 (L) 03/28/2024 0432   BUN 10 01/12/2024 1038   CREATININE 0.55 03/28/2024 0432   CALCIUM  9.1 03/28/2024 0432   GFRNONAA >60 03/28/2024 0432   GFRAA >60 08/16/2018 0302    INR    Component Value Date/Time   INR 1.1 03/20/2024 0829     Intake/Output Summary (Last 24 hours) at 03/28/2024 0841 Last data filed at 03/28/2024 0431 Gross per 24 hour  Intake 1670.39 ml  Output 2125 ml  Net -454.61 ml      Assessment/Plan:  68 y.o. female is 1 day post op, s/p: right EIA/CIA stenting and femoral endarterectomy   -She is doing well this morning without any complaints. She denies any  pain -Right groin incision is soft without hematoma -RLE warm and well perfused with brisk DP/PT doppler signals -Hgb stable at 11.7 without further signs of blood loss -She said she might want to go home today. She has not mobilized yet. I told her she could potentially go home later this afternoon if she walks around without any issues -Will arrange f/u in 2-3 wks for incision check   Ahmed Holster, PA-C [facility] [phone] 03/28/2024 8:41 AM   I have independently interviewed and examined patient and agree with PA assessment and plan above.  Foot is much warmer today and there are signals with the peroneal and dorsalis pedis.  If patient cannot heal the wound on her right lateral foot she will need consideration of attempted endovascular revascularization of the SFA and popliteal segment versus possible bypass but hopefully the amount of blood flow she has will be suitable.  She will follow-up in short order for wound check.  Zaide Kardell C. Sheree, MD [facility] [place] Office: [phone] Pager: [phone]

## 2024-03-29 ENCOUNTER — Encounter: Payer: Self-pay | Admitting: Pulmonary Disease

## 2024-03-29 ENCOUNTER — Other Ambulatory Visit (HOSPITAL_COMMUNITY): Payer: Self-pay

## 2024-03-29 ENCOUNTER — Ambulatory Visit: Admitting: Pulmonary Disease

## 2024-03-29 ENCOUNTER — Telehealth (INDEPENDENT_AMBULATORY_CARE_PROVIDER_SITE_OTHER): Payer: Self-pay | Admitting: Primary Care

## 2024-03-29 LAB — BASIC METABOLIC PANEL WITH GFR
Anion gap: 9 (ref 5–15)
BUN: 6 mg/dL — ABNORMAL LOW (ref 8–23)
CO2: 31 mmol/L (ref 22–32)
Calcium: 9.2 mg/dL (ref 8.9–10.3)
Chloride: 97 mmol/L — ABNORMAL LOW (ref 98–111)
Creatinine, Ser: 0.51 mg/dL (ref 0.44–1.00)
GFR, Estimated: >60 mL/min (ref >60)
Glucose, Bld: 129 mg/dL — ABNORMAL HIGH (ref 70–99)
Potassium: 2.9 mmol/L — ABNORMAL LOW (ref 3.5–5.1)
Sodium: 137 mmol/L (ref 135–145)

## 2024-03-29 MED ORDER — CLOPIDOGREL BISULFATE 75 MG PO TABS
75.0000 mg | ORAL_TABLET | Freq: Every day | ORAL | 11 refills | Status: AC
  Filled 2024-03-29: qty 30, 30d supply, fill #0
  Filled 2024-04-25: qty 30, 30d supply, fill #1
  Filled 2024-07-30: qty 30, 30d supply, fill #0
  Filled 2024-08-27: qty 30, 30d supply, fill #1
  Filled 2024-09-27 – 2024-10-04 (×2): qty 30, 30d supply, fill #2
  Filled 2024-10-04: qty 30, 30d supply, fill #0

## 2024-03-29 MED ORDER — OXYCODONE-ACETAMINOPHEN 5-325 MG PO TABS
1.0000 | ORAL_TABLET | Freq: Four times a day (QID) | ORAL | 0 refills | Status: DC | PRN
  Filled 2024-03-29: qty 15, 2d supply, fill #0

## 2024-03-29 NOTE — Telephone Encounter (Signed)
 Copied from CRM [id number]. Topic: General - Other >> Mar 29, 2024 10:09 AM [place] E wrote: Reason for CRM: Patient would like a physical copy of her future appointments sent to her. Callback number [phone].  Info for next appt with [facility] sent to address on file.

## 2024-03-29 NOTE — Progress Notes (Deleted)
 Synopsis: Referred in June 2025 for ***  Subjective:   PATIENT ID: Danielle Morton GENDER: female DOB: [birth year], MRN: [id number]   HPI  No chief complaint on file.  Danielle Morton is a 68 year old woman, former smoker with history of peripheral vascular disease and hypertension who is referred to pulmonary clinic     Past Medical History:  Diagnosis Date   Elevated coronary artery calcium  score 03/22/2022   1179 (99th percentile)   Hypertension    Mesenteric ischemia (HCC)    PVD (peripheral vascular disease) (HCC)      Family History  Problem Relation Age of Onset   Colon cancer Neg Hx    Pancreatic cancer Neg Hx    Stomach cancer Neg Hx    Esophageal cancer Neg Hx      Social History   Socioeconomic History   Marital status: Single    Spouse name: Not on file   Number of children: 1   Years of education: Not on file   Highest education level: Not on file  Occupational History   Not on file  Tobacco Use   Smoking status: Former    Current packs/day: 0.00    Average packs/day: 1 pack/day for 30.0 years (30.0 ttl pk-yrs)    Types: Cigarettes    Quit date: 03/21/2019    Years since quitting: 5.0   Smokeless tobacco: Never  Vaping Use   Vaping status: Never Used  Substance and Sexual Activity   Alcohol use: Not Currently   Drug use: No   Sexual activity: Not on file  Other Topics Concern   Not on file  Social History Narrative   Not on file   Social Drivers of Health   Financial Resource Strain: Not on file  Food Insecurity: No Food Insecurity (03/28/2024)   Hunger Vital Sign    Worried About Running Out of Food in the Last Year: Never true    Ran Out of Food in the Last Year: Never true  Transportation Needs: Unmet Transportation Needs (03/28/2024)   PRAPARE - Administrator, Civil Service (Medical): Yes    Lack of Transportation (Non-Medical): Yes  Physical Activity: Not on file  Stress: Not on file  Social Connections: Unknown (03/28/2024)    Social Connection and Isolation Panel    Frequency of Communication with Friends and Family: More than three times a week    Frequency of Social Gatherings with Friends and Family: Three times a week    Attends Religious Services: 1 to 4 times per year    Active Member of Clubs or Organizations: No    Attends Banker Meetings: Never    Marital Status: Patient declined  Intimate Partner Violence: Not At Risk (03/28/2024)   Humiliation, Afraid, Rape, and Kick questionnaire    Fear of Current or Ex-Partner: No    Emotionally Abused: No    Physically Abused: No    Sexually Abused: No     No Known Allergies   Facility-Administered Medications Prior to Visit  Medication Dose Route Frequency Provider Last Rate Last Admin   0.9 %  sodium chloride  infusion  500 mL Intravenous Once PRN Rhyne, Samantha J, PA-C       acetaminophen  (TYLENOL ) tablet 325-650 mg  325-650 mg Oral Q4H PRN Rhyne, Samantha J, PA-C       Or   acetaminophen  (TYLENOL ) suppository 325-650 mg  325-650 mg Rectal Q4H PRN Rhyne, Samantha J, PA-C  amLODipine  (NORVASC ) tablet 5 mg  5 mg Oral Daily Rhyne, Samantha J, PA-C   5 mg at 03/29/24 0810   aspirin  EC tablet 81 mg  81 mg Oral Daily Rhyne, Samantha J, PA-C   81 mg at 03/29/24 9189   bisacodyl  (DULCOLAX) suppository 10 mg  10 mg Rectal Daily PRN Rhyne, Samantha J, PA-C       clopidogrel  (PLAVIX ) tablet 75 mg  75 mg Oral Q0600 Rhyne, Samantha J, PA-C   75 mg at 03/29/24 9491   docusate sodium  (COLACE) capsule 100 mg  100 mg Oral Daily Rhyne, Samantha J, PA-C   100 mg at 03/29/24 0810   heparin  injection 5,000 Units  5,000 Units Subcutaneous Q8H Rhyne, Samantha J, PA-C   5,000 Units at 03/29/24 9491   hydrALAZINE  (APRESOLINE ) injection 5 mg  5 mg Intravenous Q20 Min PRN Rhyne, Samantha J, PA-C       hydrochlorothiazide  (HYDRODIURIL ) tablet 25 mg  25 mg Oral Daily Rhyne, Samantha J, PA-C   25 mg at 03/29/24 9189   HYDROmorphone  (DILAUDID ) injection 0.5 mg  0.5  mg Intravenous Q3H PRN Rhyne, Samantha J, PA-C       labetalol  (NORMODYNE ) injection 10 mg  10 mg Intravenous Q10 min PRN Rhyne, Samantha J, PA-C       metoprolol  tartrate (LOPRESSOR ) injection 2.5-5 mg  2.5-5 mg Intravenous Q2H PRN Rhyne, Samantha J, PA-C       ondansetron  (ZOFRAN ) injection 4 mg  4 mg Intravenous Q6H PRN Rhyne, Samantha J, PA-C       oxyCODONE -acetaminophen  (PERCOCET/ROXICET) 5-325 MG per tablet 1-2 tablet  1-2 tablet Oral Q4H PRN Rhyne, Samantha J, PA-C   2 tablet at 03/28/24 2314   phenol (CHLORASEPTIC) mouth spray 1 spray  1 spray Mouth/Throat PRN Rhyne, Samantha J, PA-C       polyethylene glycol (MIRALAX  / GLYCOLAX ) packet 17 g  17 g Oral Daily PRN Rhyne, Samantha J, PA-C       potassium chloride  SA (KLOR-CON  M) CR tablet 40-60 mEq  40-60 mEq Oral Daily PRN Rhyne, Samantha J, PA-C       rosuvastatin  (CRESTOR ) tablet 40 mg  40 mg Oral Daily Rhyne, Samantha J, PA-C   40 mg at 03/29/24 9189   Outpatient Medications Prior to Visit  Medication Sig Dispense Refill   amLODipine  (NORVASC ) 5 MG tablet Take 1 tablet (5 mg total) by mouth daily. 90 tablet 1   aspirin  EC 81 MG tablet Take 81 mg by mouth daily. Swallow whole.     [START ON 03/30/2024] clopidogrel  (PLAVIX ) 75 MG tablet Take 1 tablet (75 mg total) by mouth daily at 6 (six) AM. 30 tablet 11   Evolocumab  (REPATHA  SURECLICK) 140 MG/ML SOAJ Inject 140 mg into the skin every 14 (fourteen) days. 6 mL 3   hydrochlorothiazide  (HYDRODIURIL ) 25 MG tablet Take 1 tablet (25 mg total) by mouth daily. 90 tablet 1   oxyCODONE -acetaminophen  (PERCOCET/ROXICET) 5-325 MG tablet Take 1-2 tablets by mouth every 6 (six) hours as needed for moderate pain (pain score 4-6). 15 tablet 0   rosuvastatin  (CRESTOR ) 40 MG tablet Take 1 tablet (40 mg total) by mouth daily. 90 tablet 1    ROS    Objective:  There were no vitals filed for this visit.   Physical Exam    CBC    Component Value Date/Time   WBC 13.2 (H) 03/28/2024 0432   RBC  3.78 (L) 03/28/2024 0432   HGB 11.7 (L) 03/28/2024 9567  HGB 13.4 01/12/2024 1038   HCT 34.7 (L) 03/28/2024 0432   HCT 39.8 01/12/2024 1038   PLT 236 03/28/2024 0432   PLT 277 01/12/2024 1038   MCV 91.8 03/28/2024 0432   MCV 95 01/12/2024 1038   MCH 31.0 03/28/2024 0432   MCHC 33.7 03/28/2024 0432   RDW 13.9 03/28/2024 0432   RDW 14.4 01/12/2024 1038   LYMPHSABS 3.6 (H) 01/12/2024 1038   MONOABS 0.6 08/14/2018 0207   EOSABS 0.0 01/12/2024 1038   BASOSABS 0.0 01/12/2024 1038     Chest imaging:  PFT:     No data to display          Labs:  Path:  Echo:  Heart Catheterization:       Assessment & Plan:   No diagnosis found.  Discussion: ***   No current facility-administered medications for this visit.  Current Outpatient Medications:    [START ON 03/30/2024] clopidogrel  (PLAVIX ) 75 MG tablet, Take 1 tablet (75 mg total) by mouth daily at 6 (six) AM., Disp: 30 tablet, Rfl: 11   oxyCODONE -acetaminophen  (PERCOCET/ROXICET) 5-325 MG tablet, Take 1-2 tablets by mouth every 6 (six) hours as needed for moderate pain (pain score 4-6)., Disp: 15 tablet, Rfl: 0  Facility-Administered Medications Ordered in Other Visits:    0.9 %  sodium chloride  infusion, 500 mL, Intravenous, Once PRN, Rhyne, Samantha J, PA-C   acetaminophen  (TYLENOL ) tablet 325-650 mg, 325-650 mg, Oral, Q4H PRN **OR** acetaminophen  (TYLENOL ) suppository 325-650 mg, 325-650 mg, Rectal, Q4H PRN, Rhyne, Samantha J, PA-C   amLODipine  (NORVASC ) tablet 5 mg, 5 mg, Oral, Daily, Rhyne, Samantha J, PA-C, 5 mg at 03/29/24 0810   aspirin  EC tablet 81 mg, 81 mg, Oral, Daily, Rhyne, Samantha J, PA-C, 81 mg at 03/29/24 9189   bisacodyl  (DULCOLAX) suppository 10 mg, 10 mg, Rectal, Daily PRN, Rhyne, Samantha J, PA-C   clopidogrel  (PLAVIX ) tablet 75 mg, 75 mg, Oral, Q0600, Rhyne, Samantha J, PA-C, 75 mg at 03/29/24 0508   docusate sodium  (COLACE) capsule 100 mg, 100 mg, Oral, Daily, Rhyne, Samantha J, PA-C, 100 mg at  03/29/24 0810   heparin  injection 5,000 Units, 5,000 Units, Subcutaneous, Q8H, Rhyne, Samantha J, PA-C, 5,000 Units at 03/29/24 9491   hydrALAZINE  (APRESOLINE ) injection 5 mg, 5 mg, Intravenous, Q20 Min PRN, Rhyne, Samantha J, PA-C   hydrochlorothiazide  (HYDRODIURIL ) tablet 25 mg, 25 mg, Oral, Daily, Rhyne, Samantha J, PA-C, 25 mg at 03/29/24 9189   HYDROmorphone  (DILAUDID ) injection 0.5 mg, 0.5 mg, Intravenous, Q3H PRN, Rhyne, Samantha J, PA-C   labetalol  (NORMODYNE ) injection 10 mg, 10 mg, Intravenous, Q10 min PRN, Rhyne, Samantha J, PA-C   metoprolol  tartrate (LOPRESSOR ) injection 2.5-5 mg, 2.5-5 mg, Intravenous, Q2H PRN, Rhyne, Samantha J, PA-C   ondansetron  (ZOFRAN ) injection 4 mg, 4 mg, Intravenous, Q6H PRN, Rhyne, Samantha J, PA-C   oxyCODONE -acetaminophen  (PERCOCET/ROXICET) 5-325 MG per tablet 1-2 tablet, 1-2 tablet, Oral, Q4H PRN, Rhyne, Samantha J, PA-C, 2 tablet at 03/28/24 2314   phenol (CHLORASEPTIC) mouth spray 1 spray, 1 spray, Mouth/Throat, PRN, Rhyne, Samantha J, PA-C   polyethylene glycol (MIRALAX  / GLYCOLAX ) packet 17 g, 17 g, Oral, Daily PRN, Rhyne, Samantha J, PA-C   potassium chloride  SA (KLOR-CON  M) CR tablet 40-60 mEq, 40-60 mEq, Oral, Daily PRN, Rhyne, Samantha J, PA-C   rosuvastatin  (CRESTOR ) tablet 40 mg, 40 mg, Oral, Daily, Rhyne, Samantha J, PA-C, 40 mg at 03/29/24 [phone]

## 2024-03-29 NOTE — Progress Notes (Addendum)
  Progress Note    03/29/2024 8:36 AM 2 Days Post-Op  Subjective: No complaints this morning.  Says she has intermittent sharp pains in the right groin   Vitals:   03/29/24 0807 03/29/24 0808  BP:  138/84  Pulse:  77  Resp:    Temp: 98.7 F (37.1 C)   SpO2:      Physical Exam: General: Resting comfortably Cardiac: Regular Lungs: Nonlabored Incisions: Right groin incision c/d/l without hematoma Extremities: Right foot is warm and well-perfused with brisk DP/PT Doppler signals.  Right lateral foot wound is dry   CBC    Component Value Date/Time   WBC 13.2 (H) 03/28/2024 0432   RBC 3.78 (L) 03/28/2024 0432   HGB 11.7 (L) 03/28/2024 0432   HGB 13.4 01/12/2024 1038   HCT 34.7 (L) 03/28/2024 0432   HCT 39.8 01/12/2024 1038   PLT 236 03/28/2024 0432   PLT 277 01/12/2024 1038   MCV 91.8 03/28/2024 0432   MCV 95 01/12/2024 1038   MCH 31.0 03/28/2024 0432   MCHC 33.7 03/28/2024 0432   RDW 13.9 03/28/2024 0432   RDW 14.4 01/12/2024 1038   LYMPHSABS 3.6 (H) 01/12/2024 1038   MONOABS 0.6 08/14/2018 0207   EOSABS 0.0 01/12/2024 1038   BASOSABS 0.0 01/12/2024 1038    BMET    Component Value Date/Time   NA 137 03/29/2024 0316   NA 140 01/12/2024 1038   K 2.9 (L) 03/29/2024 0316   CL 97 (L) 03/29/2024 0316   CO2 31 03/29/2024 0316   GLUCOSE 129 (H) 03/29/2024 0316   BUN 6 (L) 03/29/2024 0316   BUN 10 01/12/2024 1038   CREATININE 0.51 03/29/2024 0316   CALCIUM  9.2 03/29/2024 0316   GFRNONAA >60 03/29/2024 0316   GFRAA >60 08/16/2018 0302    INR    Component Value Date/Time   INR 1.1 03/20/2024 0829     Intake/Output Summary (Last 24 hours) at 03/29/2024 0836 Last data filed at 03/28/2024 1400 Gross per 24 hour  Intake 480 ml  Output --  Net 480 ml      Assessment/Plan:  68 y.o. female is 2 days postop, s/p:right EIA/CIA stenting and femoral endarterectomy    - She is doing well this morning without any complaints.  She says yesterday she had  intermittent sharp pains in her right groin.  Oral pain medication was helpful at the time -Right lower extremity well-perfused with brisk DP/PT/peroneal Doppler signals.  Right lateral foot wounds dry without signs of infection -Right groin incision is intact and dry without hematoma -She has mobilized several times since surgery now.  She has been evaluated by PT/OT with no outpatient therapy needs.  She is back to her baseline mobility. -She is stable for discharge home today.  Will arrange follow-up in 2 to 3 weeks for incision check   Ahmed Holster, PA-C [facility] [phone] 03/29/2024 8:36 AM   I have independently interviewed and examined patient and agree with PA assessment and plan above.  Right foot is warm and lateral foot as pictured above hopefully will heal with current amount of blood flow.  If not she would require consideration of attempted endovascular revascularization versus bypass.  She demonstrated good understanding of this and is okay for discharge today.  [place] Lapaglia C. Sheree, MD [facility] Office: [phone] Pager: [phone]

## 2024-03-29 NOTE — Plan of Care (Signed)

## 2024-04-02 ENCOUNTER — Telehealth: Payer: Self-pay

## 2024-04-02 ENCOUNTER — Ambulatory Visit (INDEPENDENT_AMBULATORY_CARE_PROVIDER_SITE_OTHER): Admitting: Podiatry

## 2024-04-02 DIAGNOSIS — Z91199 Patient's noncompliance with other medical treatment and regimen due to unspecified reason: Secondary | ICD-10-CM

## 2024-04-02 NOTE — Discharge Summary (Signed)
 Bypass Discharge Summary Patient ID: Danielle Morton [id number] y.o. 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  She now has small ulceration of the right lateral mall toe but without evidence of osteomyelitis.  She has undergone angiography and appears to have multiple level disease with extensive external iliac and common femoral disease on the right without any visible reconstitution of the vessels distally.  She is now indicated for endarterectomy with retrograde stenting and right lower extremity angiography with possible bypass.   Hospital Course: The patient was admitted to the hospital on 03/27/2024 and underwent the following surgery: 1.  Extensive right iliofemoral endarterectomy including external iliac, common femoral, medial circumflex and profunda femoral arteries and superficial femoral artery with bovine pericardial patch angioplasty 2.  Catheter selection of aorta and aortogram 3.  Stent of right common iliac artery with 7 x 39 mm VBX and stent of right external iliac artery with 7 x 120 mm VBX postdilated with 6 mm balloon 4.  Right lower extremity angiogram  She tolerated the procedure well and was transferred to the PACU in stable condition.  On  POD 1 she was doing well.  Her pain was well-controlled.  She was mobilizing near her baseline, with minimal assistance required.  Her right groin incision was dry without hematoma.  Her hemoglobin was stable at 11.7.  She was tolerating a normal diet.  She was kept for 1 more day to increase in her mobility and pain control on oral medications.  On POD 2, she was doing even better.  Her pain was well-controlled on oral medications.  She was mobilizing well without any assistance required.  She was tolerating a normal diet.  Her incision was intact without bleeding or hematoma.  She was discharged home on POD 2.  Consults: None  Treatments: IV hydration, antibiotics: Ancef , analgesia: Dilaudid  and Percocet, anticoagulation: ASA, Plavix , and subcutaneous heparin , and surgery:   1.  Extensive right iliofemoral endarterectomy including external iliac, common femoral, medial circumflex and profunda femoral arteries and superficial femoral artery with bovine pericardial patch angioplasty 2.  Catheter selection of aorta and aortogram 3.  Stent of right common iliac artery with 7 x 39 mm VBX and stent of right external iliac artery with 7 x 120 mm VBX postdilated with 6 mm balloon 4.  Right lower extremity angiogram    Disposition: Discharge disposition: 01-Home or Self Care       - For [facility] Registry use ---  Post-op:  Wound infection: No  Graft infection: No  Transfusion: No  If yes,  units given New Arrhythmia: No Patency judged by: [ x] Doppler only, [ ]  Palpable graft pulse, [ ]  Palpable distal pulse, [ ]  ABI inc. > 0.15, [ ]  Duplex D/C Ambulatory Status: Ambulatory  Complications: MI: [x ] No, [ ]  Troponin only, [ ]  EKG or Clinical CHF: No Resp failure: [x ] none, [ ]   Pneumonia, [ ]  Ventilator Chg in renal function: [x ] none, [ ]  Inc. Cr > 0.5, [ ]  Temp. Dialysis, [ ]  Permanent dialysis Stroke: [ x] None, [ ]  Minor, [ ]  Major Return to OR: No  Reason for return to OR: [ ]   Bleeding, [ ]  Infection, [ ]  Thrombosis, [ ]  Revision  Discharge medications: Statin use:  Yes ASA use:  Yes Plavix  use:  Yes Beta blocker use: No  for medical reason not medically necessary Coumadin use: No  for medical reason not medically necessary    Patient Instructions:  Allergies as of 03/29/2024   No Known Allergies      Medication List     TAKE these medications    amLODipine  5 MG tablet Commonly known as: NORVASC  Take 1 tablet (5 mg total) by mouth daily.   aspirin  EC 81 MG tablet Take 81 mg by mouth daily. Swallow whole.   clopidogrel  75 MG tablet Commonly known as: PLAVIX  Take 1 tablet (75 mg total) by mouth daily at 6 (six) AM.   hydrochlorothiazide  25 MG tablet Commonly known as: HYDRODIURIL  Take 1 tablet (25 mg total) by mouth daily.   oxyCODONE -acetaminophen  5-325 MG tablet Commonly known as: PERCOCET/ROXICET Take 1-2 tablets by mouth every 6 (six) hours as needed for moderate pain (pain score 4-6).   Repatha  SureClick 140 MG/ML Soaj Generic drug: Evolocumab  Inject 140 mg into the skin every 14 (fourteen) days.   rosuvastatin  40 MG tablet Commonly known as: CRESTOR  Take 1 tablet (40 mg total) by mouth daily.               Discharge Care Instructions  (From admission, onward)           Start     Ordered   03/29/24 0000  Discharge wound care:       Comments: Wash your right groin incision daily with antibacterial soap and water, then pat dry. Do not put any ointments or creams on your incision   03/29/24 0842           Activity: activity as tolerated, no driving while on analgesics, and no heavy lifting for 4 weeks Diet: regular diet Wound Care: keep wound clean and dry  Follow-up with [facility] in 2-3 weeks.  SignedBETHA Ahmed Holster, PA-C 04/02/2024 8:59 AM

## 2024-04-02 NOTE — Transitions of Care (Post Inpatient/ED Visit) (Signed)
 04/02/2024  Name: Danielle Morton MRN: [id number] DOB: September 23, 1955  Today's TOC FU Call Status: Today's TOC FU Call Status:: Successful TOC FU Call Completed TOC FU Call Complete Date: 04/02/24 Patient's Name and Date of Birth confirmed.  Transition Care Management Follow-up Telephone Call Date of Discharge: 03/29/24 Discharge Facility: [facility]) Type of Discharge: Inpatient Admission Primary Inpatient Discharge Diagnosis:: athersclerosis How have you been since you were released from the hospital?: Better Any questions or concerns?: Yes Patient Questions/Concerns:: She needs to reschedule appointments with pulmonary and podiatry.  She said she has not been able to get through to anyone and when she does, they keep hanging up on her. Patient Questions/Concerns Addressed: Other: (I placed a conference call with the patient and Alicia/pulmonary and rescheduled her appointment for 05/30/2024.  I then called [facility] and spoke to [facility] and rescheduled that appointment for 04/23/2024 @ 0915.)  Items Reviewed: Did you receive and understand the discharge instructions provided?: Yes Medications obtained,verified, and reconciled?: Yes (Medications Reviewed) Any new allergies since your discharge?: No Dietary orders reviewed?: Yes Type of Diet Ordered:: heart healthy low sodium Do you have support at home?: Yes People in Home [RPT]: sibling(s) Name of Support/Comfort Primary Source: her sister  Medications Reviewed Today: Medications Reviewed Today     Reviewed by Marvis Bradley, RN (Case Manager) on 04/02/24 at 1256  Med List Status: <None>   Medication Order Taking? Sig Documenting Provider Last Dose Status Informant  amLODipine  (NORVASC ) 5 MG tablet [id number] No Take 1 tablet (5 mg total) by mouth daily. Celestia Rosaline SQUIBB, NP 03/26/2024 Active Self, Pharmacy Records, Multiple Informants  aspirin  EC 81 MG tablet [id number] No Take 81 mg by mouth daily. Swallow whole. [provider]  03/26/2024 Active Self, Pharmacy Records, Multiple Informants  clopidogrel  (PLAVIX ) 75 MG tablet [id number]  Take 1 tablet (75 mg total) by mouth daily at 6 (six) AM. Elna, McKenzi P, PA-C  Active   Evolocumab  (REPATHA  SURECLICK) 140 MG/ML SOAJ [id number] No Inject 140 mg into the skin every 14 (fourteen) days. Court Dorn PARAS, MD Past Month Active Self, Pharmacy Records, Multiple Informants  hydrochlorothiazide  (HYDRODIURIL ) 25 MG tablet [id number] No Take 1 tablet (25 mg total) by mouth daily. Celestia Rosaline SQUIBB, NP 03/26/2024 Active Self, Pharmacy Records, Multiple Informants  oxyCODONE -acetaminophen  (PERCOCET/ROXICET) 5-325 MG tablet [id number]  Take 1-2 tablets by mouth every 6 (six) hours as needed for moderate pain (pain score 4-6). Schuh, McKenzi P, PA-C  Active   rosuvastatin  (CRESTOR ) 40 MG tablet [id number] No Take 1 tablet (40 mg total) by mouth daily. Celestia Rosaline SQUIBB, NP 03/26/2024 Active Self, Pharmacy Records, Multiple Informants            Home Care and Equipment/Supplies: Were Home Health Services Ordered?: No Any new equipment or medical supplies ordered?: No  Functional Questionnaire: Do you need assistance with bathing/showering or dressing?: No Do you need assistance with meal preparation?: No Do you need assistance with eating?: No Do you have difficulty maintaining continence: No Do you need assistance with getting out of bed/getting out of a chair/moving?: Yes (has cane to use with ambulation) Do you have difficulty managing or taking your medications?: No  Follow up appointments reviewed: PCP Follow-up appointment confirmed?: Yes Date of PCP follow-up appointment?: 04/11/24 Follow-up Provider: Rosaline Celestia, NP Specialist Hospital Follow-up appointment confirmed?: Yes Date of Specialist follow-up appointment?: 04/19/24 Follow-Up Specialty Provider:: [facility].   pulmonary- 05/30/2024.  podaitry- 04/23/2024. Do you need transportation to your follow-up appointment?:  No Do  you understand care options if your condition(s) worsen?: Yes-patient verbalized understanding    SIGNATURE Slater Diesel, RN

## 2024-04-02 NOTE — Progress Notes (Signed)
 Cancel 24 hours

## 2024-04-03 ENCOUNTER — Ambulatory Visit: Attending: Primary Care

## 2024-04-03 VITALS — Ht 64.0 in | Wt 100.0 lb

## 2024-04-03 DIAGNOSIS — Z0000 Encounter for general adult medical examination without abnormal findings: Secondary | ICD-10-CM | POA: Diagnosis not present

## 2024-04-03 NOTE — Patient Instructions (Signed)
 Danielle Morton , Thank you for taking time out of your busy schedule to complete your Annual Wellness Visit with me. I enjoyed our conversation and look forward to speaking with you again next year. I, as well as your care team,  appreciate your ongoing commitment to your health goals. Please review the following plan we discussed and let me know if I can assist you in the future. Your Game plan/ To Do List    Referrals: If you haven't heard from the office you've been referred to, please reach out to them at the phone provided.   Follow up Visits: Next Medicare AWV with our clinical staff: 04/25/2025 at 4:50 p.m. phone visit with Nurse Health Advisor   Have you seen your provider in the last 6 months (3 months if uncontrolled diabetes)? Yes Next Office Visit with your provider: 04/11/2024 at 4:50 p.m. hospital follow-up with [name], NP.  Clinician Recommendations:  Aim for 30 minutes of exercise or brisk walking, 6-8 glasses of water, and 5 servings of fruits and vegetables each day.       This is a list of the screening recommended for you and due dates:  Health Maintenance  Topic Date Due   DTaP/Tdap/Td vaccine (1 - Tdap) Never done   Zoster (Shingles) Vaccine (1 of 2) Never done   Colon Cancer Screening  Never done   Screening for Lung Cancer  Never done   DEXA scan (bone density measurement)  Never done   COVID-19 Vaccine (4 - 2024-25 season) 05/22/2023   Flu Shot  04/20/2024   Medicare Annual Wellness Visit  04/03/2025   Mammogram  02/16/2026   Pneumococcal Vaccine for age over 57  Completed   Hepatitis C Screening  Completed   Hepatitis B Vaccine  Aged Out   HPV Vaccine  Aged Out   Meningitis B Vaccine  Aged Out    Advanced directives: (Declined) Advance directive discussed with you today. Even though you declined this today, please call our office should you change your mind, and we can give you the proper paperwork for you to fill out. Advance Care Planning is important  because it:  [x]  Makes sure you receive the medical care that is consistent with your values, goals, and preferences  [x]  It provides guidance to your family and loved ones and reduces their decisional burden about whether or not they are making the right decisions based on your wishes.  Follow the link provided in your after visit summary or read over the paperwork we have mailed to you to help you started getting your Advance Directives in place. If you need assistance in completing these, please reach out to us  so that we can help you!  See attachments for Preventive Care and Fall Prevention Tips.

## 2024-04-03 NOTE — Progress Notes (Signed)
 Because this visit was a virtual/telehealth visit,  certain criteria was not obtained, such a blood pressure, CBG if applicable, and timed get up and go. Any medications not marked as taking were not mentioned during the medication reconciliation part of the visit. Any vitals not documented were not able to be obtained due to this being a telehealth visit or patient was unable to self-report a recent blood pressure reading due to a lack of equipment at home via telehealth. Vitals that have been documented are verbally provided by the patient.   Subjective:   Danielle Morton is a 68 y.o. who presents for a Medicare Wellness preventive visit.  As a reminder, Annual Wellness Visits don't include a physical exam, and some assessments may be limited, especially if this visit is performed virtually. We may recommend an in-person follow-up visit with your provider if needed.  Visit Complete: Virtual I connected with  Danielle Morton on 04/03/24 by a audio enabled telemedicine application and verified that I am speaking with the correct person using two identifiers.  Patient Location: Home  Provider Location: Office/Clinic  I discussed the limitations of evaluation and management by telemedicine. The patient expressed understanding and agreed to proceed.  Vital Signs: Because this visit was a virtual/telehealth visit, some criteria may be missing or patient reported. Any vitals not documented were not able to be obtained and vitals that have been documented are patient reported.  VideoDeclined- This patient declined Librarian, academic. Therefore the visit was completed with audio only.  Persons Participating in Visit: Patient.  AWV Questionnaire: No: Patient Medicare AWV questionnaire was not completed prior to this visit.  Cardiac Risk Factors include: advanced age (>62men, >80 women);dyslipidemia;hypertension;sedentary lifestyle     Objective:    Today's Vitals    04/03/24 1703  Weight: 100 lb (45.4 kg)  Height: 5' 4 (1.626 m)  PainSc: 0-No pain   Body mass index is 17.16 kg/m.     04/03/2024    4:57 PM 03/27/2024    8:15 AM 03/20/2024    8:25 AM 03/12/2024    7:57 AM 02/08/2024   10:09 AM 12/26/2021    3:18 PM 08/14/2018    1:07 AM  Advanced Directives  Does Patient Have a Medical Advance Directive? No Yes No No No No No   Type of Advance Directive  Living will       Would patient like information on creating a medical advance directive? No - Patient declined  No - Patient declined No - Patient declined No - Patient declined  No - Patient declined      Data saved with a previous flowsheet row definition    Current Medications (verified) Outpatient Encounter Medications as of 04/03/2024  Medication Sig   amLODipine  (NORVASC ) 5 MG tablet Take 1 tablet (5 mg total) by mouth daily.   aspirin  EC 81 MG tablet Take 81 mg by mouth daily. Swallow whole.   clopidogrel  (PLAVIX ) 75 MG tablet Take 1 tablet (75 mg total) by mouth daily at 6 (six) AM.   Evolocumab  (REPATHA  SURECLICK) 140 MG/ML SOAJ Inject 140 mg into the skin every 14 (fourteen) days.   hydrochlorothiazide  (HYDRODIURIL ) 25 MG tablet Take 1 tablet (25 mg total) by mouth daily.   oxyCODONE -acetaminophen  (PERCOCET/ROXICET) 5-325 MG tablet Take 1-2 tablets by mouth every 6 (six) hours as needed for moderate pain (pain score 4-6).   rosuvastatin  (CRESTOR ) 40 MG tablet Take 1 tablet (40 mg total) by mouth daily.   No facility-administered  encounter medications on file as of 04/03/2024.    Allergies (verified) Patient has no known allergies.   History: Past Medical History:  Diagnosis Date   Elevated coronary artery calcium  score 03/22/2022   1179 (99th percentile)   Hypertension    Mesenteric ischemia (HCC)    PVD (peripheral vascular disease) (HCC)    Past Surgical History:  Procedure Laterality Date   ABDOMINAL AORTOGRAM W/LOWER EXTREMITY N/A 03/12/2024   Procedure: ABDOMINAL  AORTOGRAM W/LOWER EXTREMITY;  Surgeon: Sheree Penne Bruckner, MD;  Location: [facility] INVASIVE CV LAB;  Service: Cardiovascular;  Laterality: N/A;   ANGIOPLASTY N/A 12/28/2021   Procedure: BALLOON ANGIOPLASTY OF SUPERIOR MESENTERIC ARTERY;  Surgeon: Lanis Fonda BRAVO, MD;  Location: [facility] OR;  Service: Vascular;  Laterality: N/A;   ENDARTERECTOMY FEMORAL Left 08/15/2018   Procedure: ENDARTERECTOMY COMMON FEMORAL;  Surgeon: Sheree Penne Bruckner, MD;  Location: [facility] OR;  Service: Vascular;  Laterality: Left;   ENDARTERECTOMY FEMORAL Right 03/27/2024   Procedure: ENDARTERECTOMY, FEMORAL WITH PATCH ANGIOPLASTY;  Surgeon: Sheree Penne Bruckner, MD;  Location: [facility] OR;  Service: Vascular;  Laterality: Right;   INSERTION OF ILIAC STENT Left 08/15/2018   Procedure: INSERTION OF ILIAC STENT RETROGRADE;  Surgeon: Sheree Penne Bruckner, MD;  Location: [facility] OR;  Service: Vascular;  Laterality: Left;   INSERTION OF ILIAC STENT Right 03/27/2024   Procedure: INSERTION, STENT, ARTERY, COMMON AND EXTERNAL ILIAC;  Surgeon: Sheree Penne Bruckner, MD;  Location: [facility] OR;  Service: Vascular;  Laterality: Right;   LOWER EXTREMITY ANGIOGRAM Right 03/27/2024   Procedure: GERALYN, LOWER EXTREMITY;  Surgeon: Sheree Penne Bruckner, MD;  Location: [facility] OR;  Service: Vascular;  Laterality: Right;   LOWER EXTREMITY ANGIOGRAPHY N/A 08/14/2018   Procedure: LOWER EXTREMITY ANGIOGRAPHY;  Surgeon: Sheree Penne Bruckner, MD;  Location: [facility] INVASIVE CV LAB;  Service: Cardiovascular;  Laterality: N/A;   LOWER EXTREMITY ANGIOGRAPHY N/A 03/12/2024   Procedure: Lower Extremity Angiography;  Surgeon: Sheree Penne Bruckner, MD;  Location: [facility] INVASIVE CV LAB;  Service: Cardiovascular;  Laterality: N/A;   LOWER EXTREMITY INTERVENTION N/A 03/12/2024   Procedure: LOWER EXTREMITY INTERVENTION;  Surgeon: Sheree Penne Bruckner, MD;  Location: [facility] INVASIVE CV LAB;  Service: Cardiovascular;  Laterality: N/A;   MESENTERIC ARTERY BYPASS Left  12/28/2021   Procedure: MESENTERIC ANGIOGRAPHY FROM LEFT BRACHIAL ARTERY;  Surgeon: Lanis Fonda BRAVO, MD;  Location: [facility] OR;  Service: Vascular;  Laterality: Left;   Family History  Problem Relation Age of Onset   Colon cancer Neg Hx    Pancreatic cancer Neg Hx    Stomach cancer Neg Hx    Esophageal cancer Neg Hx    Social History   Socioeconomic History   Marital status: Single    Spouse name: Not on file   Number of children: 1   Years of education: Not on file   Highest education level: Not on file  Occupational History   Not on file  Tobacco Use   Smoking status: Former    Current packs/day: 0.00    Average packs/day: 1 pack/day for 30.0 years (30.0 ttl pk-yrs)    Types: Cigarettes    Quit date: 03/21/2019    Years since quitting: 5.0   Smokeless tobacco: Never  Vaping Use   Vaping status: Never Used  Substance and Sexual Activity   Alcohol use: Not Currently   Drug use: No   Sexual activity: Not on file  Other Topics Concern   Not on file  Social History Narrative   Not on file  Social Drivers of Corporate investment banker Strain: Low Risk  (04/03/2024)   Overall Financial Resource Strain (CARDIA)    Difficulty of Paying Living Expenses: Not hard at all  Food Insecurity: No Food Insecurity (04/03/2024)   Hunger Vital Sign    Worried About Running Out of Food in the Last Year: Never true    Ran Out of Food in the Last Year: Never true  Transportation Needs: Unmet Transportation Needs (04/03/2024)   PRAPARE - Administrator, Civil Service (Medical): Yes    Lack of Transportation (Non-Medical): Yes  Physical Activity: Inactive (04/03/2024)   Exercise Vital Sign    Days of Exercise per Week: 0 days    Minutes of Exercise per Session: 0 min  Stress: No Stress Concern Present (04/03/2024)   [employer] of Occupational Health - Occupational Stress Questionnaire    Feeling of Stress: Not at all  Social Connections: Unknown (04/03/2024)   Social  Connection and Isolation Panel    Frequency of Communication with Friends and Family: More than three times a week    Frequency of Social Gatherings with Friends and Family: Three times a week    Attends Religious Services: 1 to 4 times per year    Active Member of Clubs or Organizations: No    Attends Banker Meetings: Never    Marital Status: Patient declined    Tobacco Counseling Counseling given: Not Answered    Clinical Intake:  Pre-visit preparation completed: Yes  Pain : No/denies pain Pain Score: 0-No pain     BMI - recorded: 17.16 Nutritional Risks: None Diabetes: No  Lab Results  Component Value Date   HGBA1C 5.5 02/22/2022     How often do you need to have someone help you when you read instructions, pamphlets, or other written materials from your doctor or pharmacy?: 1 - Never  Interpreter Needed?: No  Information entered by :: Derin Granquist N. Shelsy Seng, LPN.   Activities of Daily Living     04/03/2024    4:59 PM 03/20/2024    8:27 AM  In your present state of health, do you have any difficulty performing the following activities:  Hearing? 0   Vision? 0   Difficulty concentrating or making decisions? 0   Walking or climbing stairs? 0   Dressing or bathing? 0   Doing errands, shopping? 0 0  Preparing Food and eating ? N   Using the Toilet? N   In the past six months, have you accidently leaked urine? N   Do you have problems with loss of bowel control? N   Managing your Medications? N   Managing your Finances? N   Housekeeping or managing your Housekeeping? N     Patient Care Team: Celestia Rosaline SQUIBB, NP as PCP - General (Internal Medicine)  I have updated your Care Teams any recent Medical Services you may have received from other providers in the past year.     Assessment:   This is a routine wellness examination for Danielle Morton.  Hearing/Vision screen Hearing Screening - Comments:: Denies hearing difficulties.  Vision Screening -  Comments:: No rx glasses - not up to date with routine eye exams.    Goals Addressed             This Visit's Progress    04/03/2024: My goal is to feel better and start an exercise regimen and gain some weight.         Depression Screen  04/03/2024    4:59 PM 01/12/2024    9:38 AM 06/06/2023    1:59 PM 03/08/2023    5:11 PM 05/25/2022    2:43 PM 02/22/2022    2:40 PM  PHQ 2/9 Scores  PHQ - 2 Score 0 0 3 0 2   PHQ- 9 Score 0  5  2   Exception Documentation      Patient refusal    Fall Risk     04/03/2024    4:57 PM 01/12/2024    9:38 AM 06/06/2023    2:00 PM 03/08/2023    5:11 PM 02/22/2022    2:38 PM  Fall Risk   Falls in the past year? 0 0 0 0 0  Number falls in past yr: 0 0 0 0   Injury with Fall? 0 0 0 0   Risk for fall due to : No Fall Risks No Fall Risks  No Fall Risks   Follow up Falls evaluation completed Falls evaluation completed       MEDICARE RISK AT HOME:  Medicare Risk at Home Any stairs in or around the home?: Yes If so, are there any without handrails?: No Home free of loose throw rugs in walkways, pet beds, electrical cords, etc?: Yes Adequate lighting in your home to reduce risk of falls?: Yes Life alert?: No Use of a cane, walker or w/c?: No Grab bars in the bathroom?: No Shower chair or bench in shower?: No Elevated toilet seat or a handicapped toilet?: No  TIMED UP AND GO:  Was the test performed?  No  Cognitive Function: Declined/Normal: No cognitive concerns noted by patient or family. Patient alert, oriented, able to answer questions appropriately and recall recent events. No signs of memory loss or confusion.    04/03/2024    4:59 PM  MMSE - Mini Mental State Exam  Not completed: Unable to complete        04/03/2024    5:05 PM  6CIT Screen  What Year? 0 points  What month? 0 points  What time? 0 points  Count back from 20 0 points  Months in reverse 0 points  Repeat phrase 0 points  Total Score 0 points     Immunizations Immunization History  Administered Date(s) Administered   Fluad Trivalent(High Dose 65+) 06/06/2023   Influenza,inj,Quad PF,6+ Mos 08/16/2018, 05/25/2022   PNEUMOCOCCAL CONJUGATE-20 01/12/2024   Unspecified SARS-COV-2 Vaccination 11/30/2019, 12/21/2019, 01/09/2021    Screening Tests Health Maintenance  Topic Date Due   DTaP/Tdap/Td (1 - Tdap) Never done   Zoster Vaccines- Shingrix (1 of 2) Never done   Colonoscopy  Never done   Lung Cancer Screening  Never done   DEXA SCAN  Never done   COVID-19 Vaccine (4 - 2024-25 season) 05/22/2023   INFLUENZA VACCINE  04/20/2024   Medicare Annual Wellness (AWV)  04/03/2025   MAMMOGRAM  02/16/2026   Pneumococcal Vaccine: 50+ Years  Completed   Hepatitis C Screening  Completed   Hepatitis B Vaccines  Aged Out   HPV VACCINES  Aged Out   Meningococcal B Vaccine  Aged Out    Health Maintenance  Health Maintenance Due  Topic Date Due   DTaP/Tdap/Td (1 - Tdap) Never done   Zoster Vaccines- Shingrix (1 of 2) Never done   Colonoscopy  Never done   Lung Cancer Screening  Never done   DEXA SCAN  Never done   COVID-19 Vaccine (4 - 2024-25 season) 05/22/2023   Health Maintenance Items Addressed:  Yes Patient is due for the following care gaps: Colonoscopy, DEXA Scan, Lung Cancer Screening, Dtap, Shingrix and Covid-19 Vaccines.  Additional Screening:  Vision Screening: Recommended annual ophthalmology exams for early detection of glaucoma and other disorders of the eye. Would you like a referral to an eye doctor? No    Dental Screening: Recommended annual dental exams for proper oral hygiene  Community Resource Referral / Chronic Care Management: CRR required this visit?  No   CCM required this visit?  No   Plan:    I have personally reviewed and noted the following in the patient's chart:   Medical and social history Use of alcohol, tobacco or illicit drugs  Current medications and supplements including opioid  prescriptions. Patient is currently taking opioid prescriptions. Information provided to patient regarding non-opioid alternatives. Patient advised to discuss non-opioid treatment plan with their provider. Functional ability and status Nutritional status Physical activity Advanced directives List of other physicians Hospitalizations, surgeries, and ER visits in previous 12 months Vitals Screenings to include cognitive, depression, and falls Referrals and appointments  In addition, I have reviewed and discussed with patient certain preventive protocols, quality metrics, and best practice recommendations. A written personalized care plan for preventive services as well as general preventive health recommendations were provided to patient.   Roz LOISE Fuller, LPN   2/84/7974   After Visit Summary: (MyChart) Due to this being a telephonic visit, the after visit summary with patients personalized plan was offered to patient via MyChart   Notes: Patient is due for the following care gaps: Colonoscopy, DEXA Scan, Lung Cancer Screening, Dtap, Shingrix and Covid-19 Vaccines.

## 2024-04-06 ENCOUNTER — Other Ambulatory Visit: Payer: Self-pay

## 2024-04-10 ENCOUNTER — Ambulatory Visit

## 2024-04-10 ENCOUNTER — Telehealth (INDEPENDENT_AMBULATORY_CARE_PROVIDER_SITE_OTHER): Payer: Self-pay | Admitting: Primary Care

## 2024-04-10 NOTE — Telephone Encounter (Signed)
 Called pt to confirm appt. Pt will be present.

## 2024-04-11 ENCOUNTER — Inpatient Hospital Stay (INDEPENDENT_AMBULATORY_CARE_PROVIDER_SITE_OTHER): Admitting: Primary Care

## 2024-04-19 ENCOUNTER — Ambulatory Visit: Attending: Vascular Surgery | Admitting: Physician Assistant

## 2024-04-19 ENCOUNTER — Other Ambulatory Visit (HOSPITAL_COMMUNITY): Payer: Self-pay

## 2024-04-19 VITALS — BP 146/81 | HR 71 | Temp 97.9°F | Wt 120.8 lb

## 2024-04-19 DIAGNOSIS — I70235 Atherosclerosis of native arteries of right leg with ulceration of other part of foot: Secondary | ICD-10-CM

## 2024-04-19 MED ORDER — CEPHALEXIN 500 MG PO CAPS
500.0000 mg | ORAL_CAPSULE | Freq: Three times a day (TID) | ORAL | 0 refills | Status: DC
  Filled 2024-04-19: qty 21, 7d supply, fill #0

## 2024-04-19 NOTE — Progress Notes (Signed)
 POST OPERATIVE OFFICE NOTE    CC:  F/u for surgery  HPI: Danielle Morton is a 68 y.o. female who is here for postop visit.  She recently underwent right external iliac and common iliac artery stenting, and right femoral endarterectomy on 03/27/2024 by Dr. Sheree.  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   She returns today for follow-up.  She says that she is doing well.  She has tried to keep her right groin incision clean and dry.  She says that some of her right groin incision opened up shortly after surgery after some scabs fell off.  This opening has gotten smaller over the past week.  She denies any drainage from this area.  She denies any fevers or chills.  She says that she has tried to keep her right foot wound clean and dry.   No Known Allergies  Current Outpatient Medications  Medication Sig Dispense Refill   amLODipine  (NORVASC ) 5 MG tablet Take 1 tablet (5 mg total) by mouth daily. 90 tablet 1   aspirin  EC 81 MG tablet Take 81 mg by mouth daily. Swallow whole.     clopidogrel  (PLAVIX ) 75 MG tablet Take 1 tablet (75 mg total) by mouth daily at 6 (six) AM. 30 tablet 11   Evolocumab  (REPATHA  SURECLICK) 140 MG/ML SOAJ Inject 140 mg into the skin every 14 (fourteen) days. 6 mL 3   hydrochlorothiazide  (HYDRODIURIL ) 25 MG tablet Take 1 tablet (25 mg total) by mouth daily. 90 tablet 1   oxyCODONE -acetaminophen  (PERCOCET/ROXICET) 5-325 MG tablet Take 1-2 tablets by mouth every 6 (six) hours as needed for moderate pain (pain score 4-6). 15 tablet 0   rosuvastatin  (CRESTOR ) 40 MG tablet Take 1 tablet (40 mg total) by mouth daily. 90 tablet 1   No current facility-administered medications for this visit.     ROS:  See HPI  Physical Exam:  Incision: Right groin incision healing appropriately with 2 small areas of dehiscence, without expressible drainage.  Extremities: Right fifth toe wound is dry and unchanged.  Brisk right AT and peroneal Doppler  signals Neuro: Intact motor and sensation of the right lower extremity         Assessment/Plan:  This is a 68 y.o. female who is here for a postop visit  - The patient recently underwent right common and external iliac artery stenting and right femoral endarterectomy for critical limb ischemia with tissue loss at the fifth toe - Her right groin incision is healing appropriately, however there are 2 small areas of dehiscence.  Per the patient, these opened up shortly after surgery and have started getting smaller with time.  She denies any drainage from this area.  Her incision does not appear infected and I cannot express any drainage from this area. - Her right lower extremity is well-perfused with brisk AT and peroneal Doppler signals - I do not think that the patient's right groin is infected, however given that she has had some dehiscence and has a bovine patch in her repair, I will start her on a 7-day course of antibiotics.  Her right groin should continue to heal without intervention as long as she continues to keep it clean and dry. - Her right fifth toe ulceration does not appear changed at this time.  I would like her to keep this area dry so it can heal.  I have given her several Betadine swabs so she can paint the wound with  Betadine to keep it dry.  She is aware that she may ultimately require further right lower extremity revascularization in order to heal her wound. - She can return to clinic in 2 weeks for repeat wound check   Ahmed Holster, PA-C [facility] [phone]   Call FI:Rojmx

## 2024-04-23 ENCOUNTER — Inpatient Hospital Stay (INDEPENDENT_AMBULATORY_CARE_PROVIDER_SITE_OTHER): Admitting: Primary Care

## 2024-04-23 ENCOUNTER — Ambulatory Visit (INDEPENDENT_AMBULATORY_CARE_PROVIDER_SITE_OTHER): Admitting: Podiatry

## 2024-04-23 ENCOUNTER — Encounter: Payer: Self-pay | Admitting: Podiatry

## 2024-04-23 VITALS — BP 118/69 | HR 57

## 2024-04-23 DIAGNOSIS — I739 Peripheral vascular disease, unspecified: Secondary | ICD-10-CM

## 2024-04-23 DIAGNOSIS — L97512 Non-pressure chronic ulcer of other part of right foot with fat layer exposed: Secondary | ICD-10-CM

## 2024-04-23 NOTE — Progress Notes (Signed)
 Subjective:  Patient ID: Danielle Morton, female    DOB: [birth year],   MRN: [id number]  Chief Complaint  Patient presents with   Wound Check    It needs some work on it.  It's very sensitive this morning.     68 y.o. female presents for follow-up of wound on right foot. Has had endarterectomy on 7/8 successfully. There is still concern for inline flow to her foot being diminished and vascular mentions option for bypass or further intervention if struggle to heal wound on foot. . Denies any other pedal complaints. Denies n/v/f/c.   Past Medical History:  Diagnosis Date   Elevated coronary artery calcium  score 03/22/2022   1179 (99th percentile)   Hypertension    Mesenteric ischemia (HCC)    PVD (peripheral vascular disease) (HCC)     Objective:  Physical Exam: Vascular: DP/PT pulses non palpable bilateral. CFT <5 seconds. Absent hair growth on digits. Edema noted to bilateral lower extremities. Xerosis noted bilaterally.  Skin. No lacerations or abrasions bilateral feet. Nails 1-5 bilateral  are thickened discolored and elongated with subungual debris. Hyperkeratotic lesion noted lateral fifth digit upon debridement open ulceration noted with mild erythema surrounding and edema.  Musculoskeletal: MMT 5/5 bilateral lower extremities in DF, PF, Inversion and Eversion. Deceased ROM in DF of ankle joint.  Neurological: Sensation intact to light touch. Protective sensation diminished bilateral.    +-------+-----------+-----------+------------+------------+  ABI/TBIToday's ABIToday's TBIPrevious ABIPrevious TBI  +-------+-----------+-----------+------------+------------+  Right 0.0        0.0                                  +-------+-----------+-----------+------------+------------+  Left  .55        .52                                  +-------+-----------+-----------+------------+------------+       TOES Findings:  +----------+---------------+--------+-------+   Right ToesPressure (mmHg)WaveformComment  +----------+---------------+--------+-------+  1st Digit                Absent           +----------+---------------+--------+-------+  2nd Digit                Absent           +----------+---------------+--------+-------+  3rd Digit                Absent           +----------+---------------+--------+-------+  4th Digit                Absent           +----------+---------------+--------+-------+  5th Digit                Absent           +----------+---------------+--------+-------+      +---------+---------------+--------+------------------+  Left ToesPressure (mmHg)WaveformComment             +---------+---------------+--------+------------------+  1st Digit               AbnormalSevere              +---------+---------------+--------+------------------+  2nd Digit               AbnormalModerate to Severe  +---------+---------------+--------+------------------+  3rd Digit  AbnormalModerate            +---------+---------------+--------+------------------+  4th Digit               AbnormalModerate to Severe  +---------+---------------+--------+------------------+  5th Digit               AbnormalSevere              +---------+---------------+--------+------------------+           Findings reported to Asberry Failing, DPM at 11:50 am.   Summary:  Right: Resting right ankle-brachial index indicates critical limb  ischemia. The right toe-brachial index is abnormal.   Left: Resting left ankle-brachial index indicates moderate left lower  extremity arterial disease. The left toe-brachial index is abnormal.   Assessment:   1. 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 Peripheral arterial disease (HCC)        Plan:  Ulcer lateral fifth metatarsal head with fat layer exposed  -X-rays reviewed no osseous erosions noted. Edema noted around  ulceration site  -Debridement as below. Minimal to no bleeding noted upon debridement. Was limited by patients pain.  -Dressed with betadine, DSD. -Off-loading with surgical shoe.  -No additional antibiotics ordered today.  - Dr. Sheree had endarterectomy and femoral patch angioplasty on 7/8.  -Discussed glucose control and proper protein-rich diet.  -Discussed if any worsening redness, pain, fever or chills to call or may need to report to the emergency room. Patient expressed understanding.   Procedure: Excisional Debridement of Wound Rationale: Removal of non-viable soft tissue from the wound to promote healing.  Anesthesia: none Pre-Debridement Wound Measurements: Overlying callus  Post-Debridement Wound Measurements: 0.2 cm x 0.3 cm x 0.2 cm  Type of Debridement: Sharp Excisional Tissue Removed: Non-viable soft tissue Depth of Debridement: subcutaneous tissue. Technique: Sharp excisional debridement to bleeding, viable wound base.  Dressing: Dry, sterile, compression dressing. Disposition: Patient tolerated procedure well. Patient to return in 2 week for follow-up.  No follow-ups on file.   Return 2 weeks for wound check.   No follow-ups on file.    Asberry Failing, DPM

## 2024-04-25 ENCOUNTER — Other Ambulatory Visit (HOSPITAL_BASED_OUTPATIENT_CLINIC_OR_DEPARTMENT_OTHER): Payer: Self-pay

## 2024-04-25 ENCOUNTER — Other Ambulatory Visit: Payer: Self-pay

## 2024-04-26 ENCOUNTER — Other Ambulatory Visit: Payer: Self-pay

## 2024-04-27 ENCOUNTER — Other Ambulatory Visit: Payer: Self-pay

## 2024-04-30 ENCOUNTER — Telehealth (INDEPENDENT_AMBULATORY_CARE_PROVIDER_SITE_OTHER): Payer: Self-pay | Admitting: Primary Care

## 2024-04-30 NOTE — Telephone Encounter (Signed)
 Called pt to confirm appt. Pt did not answer but if she does call back. Please confirm appt

## 2024-05-01 ENCOUNTER — Encounter (INDEPENDENT_AMBULATORY_CARE_PROVIDER_SITE_OTHER): Payer: Self-pay | Admitting: Primary Care

## 2024-05-01 ENCOUNTER — Other Ambulatory Visit: Payer: Self-pay

## 2024-05-01 ENCOUNTER — Ambulatory Visit (INDEPENDENT_AMBULATORY_CARE_PROVIDER_SITE_OTHER): Admitting: Primary Care

## 2024-05-01 ENCOUNTER — Other Ambulatory Visit (HOSPITAL_COMMUNITY): Payer: Self-pay

## 2024-05-01 VITALS — BP 91/63 | HR 65 | Temp 98.3°F | Resp 16 | Ht 64.0 in | Wt 118.0 lb

## 2024-05-01 DIAGNOSIS — I952 Hypotension due to drugs: Secondary | ICD-10-CM | POA: Diagnosis not present

## 2024-05-01 DIAGNOSIS — F4323 Adjustment disorder with mixed anxiety and depressed mood: Secondary | ICD-10-CM

## 2024-05-01 MED ORDER — ESCITALOPRAM OXALATE 5 MG PO TABS
5.0000 mg | ORAL_TABLET | Freq: Every day | ORAL | 1 refills | Status: AC
  Filled 2024-05-01 (×2): qty 90, 90d supply, fill #0
  Filled 2024-07-30: qty 90, 90d supply, fill #1

## 2024-05-01 MED ORDER — ATORVASTATIN CALCIUM 20 MG PO TABS
20.0000 mg | ORAL_TABLET | Freq: Every day | ORAL | 1 refills | Status: AC
  Filled 2024-05-01 (×2): qty 90, 90d supply, fill #0
  Filled 2024-07-30: qty 90, 90d supply, fill #1

## 2024-05-01 NOTE — Progress Notes (Signed)
 Subjective:   [name] is a 68 y.o. female presents for hospital follow up completed by vascular and podiatry.  Patient has a no-show for podiatry on 04/02/2024 due to lack transportation. Patient had arranged transportation but they did not show  to take her to her appointment noted.  They 04/23/24 wound check for also of the right foot with fat layer exposed.  Patient is doing well she is in a boot and follow-up podiatry for wound check and dressing.  Blood pressure today is low will discontinue HCTZ return for a blood pressure check. Patient has No headache, No chest pain, No abdominal pain - No Nausea, No new weakness tingling or numbness, No Cough - shortness of breath/s.  Her only problem in concern is that her foot is healing properly.  Discussed signs and symptoms of infection to reassure her if any problems occurred she will need to call vascular or podiatry. Past Medical History:  Diagnosis Date   Elevated coronary artery calcium  score 03/22/2022   1179 (99th percentile)   Hypertension    Mesenteric ischemia (HCC)    PVD (peripheral vascular disease) (HCC)      No Known Allergies  Current Outpatient Medications on File Prior to Visit  Medication Sig Dispense Refill   amLODipine  (NORVASC ) 5 MG tablet Take 1 tablet (5 mg total) by mouth daily. 90 tablet 1   aspirin  EC 81 MG tablet Take 81 mg by mouth daily. Swallow whole.     clopidogrel  (PLAVIX ) 75 MG tablet Take 1 tablet (75 mg total) by mouth daily at 6 (six) AM. 30 tablet 11   Evolocumab  (REPATHA  SURECLICK) 140 MG/ML SOAJ Inject 140 mg into the skin every 14 (fourteen) days. 6 mL 3   hydrochlorothiazide  (HYDRODIURIL ) 25 MG tablet Take 1 tablet (25 mg total) by mouth daily. 90 tablet 1   No current facility-administered medications on file prior to visit.    Review of System: ROS Comprehensive ROS Pertinent positive and negative noted in HPI   Objective:  BP 91/63 (BP Location: Left Arm, Patient Position: Sitting,  Cuff Size: Small)   Pulse 65   Temp 98.3 F (36.8 C) (Oral)   Resp 16   Ht 5' 4 (1.626 m)   Wt 118 lb (53.5 kg)   SpO2 99%   BMI 20.25 kg/m   Filed Weights   05/01/24 1018  Weight: 118 lb (53.5 kg)    Physical Exam Constitutional:      Appearance: Normal appearance.     Comments: Body mass index is 20.25 kg/m.   HENT:     Head: Normocephalic.     Right Ear: Tympanic membrane, ear canal and external ear normal.     Left Ear: Tympanic membrane, ear canal and external ear normal.     Nose: Nose normal.  Eyes:     Extraocular Movements: Extraocular movements intact.  Cardiovascular:     Rate and Rhythm: Normal rate and regular rhythm.  Pulmonary:     Effort: Pulmonary effort is normal.     Breath sounds: Normal breath sounds.  Abdominal:     General: Abdomen is flat. Bowel sounds are normal.     Palpations: Abdomen is soft.  Musculoskeletal:     Cervical back: Normal range of motion.  Skin:    Comments: Wound ck by podiatry wrap at this time Groin area right side scar tender no s/s of infection explained to pt  Neurological:     Mental Status: She is alert  and oriented to person, place, and time.  Psychiatric:        Mood and Affect: Mood normal.        Behavior: Behavior normal.      Assessment:  Danielle Morton was seen today for hospitalization follow-up.  Diagnoses and all orders for this visit:  Hypotension due to drugs Discontinue hydrochlorothiazide  rtn 2 wks Bp ck   Adjustment disorder with mixed anxiety and depressed mood -     escitalopram  (LEXAPRO ) 5 MG tablet; Take 1 tablet (5 mg total) by mouth daily.  Other orders -     atorvastatin  (LIPITOR) 20 MG tablet; Take 1 tablet (20 mg total) by mouth daily.     This note has been created with Education officer, environmental. Any transcriptional errors are unintentional.   Return in about 2 weeks (around 05/15/2024) for re-check blood pressure.  Danielle SHAUNNA Bohr,  NP 05/05/2024, 8:53 PM

## 2024-05-02 ENCOUNTER — Ambulatory Visit: Attending: Vascular Surgery | Admitting: Physician Assistant

## 2024-05-02 VITALS — BP 110/72 | HR 50 | Temp 97.7°F | Wt 122.1 lb

## 2024-05-02 DIAGNOSIS — I70235 Atherosclerosis of native arteries of right leg with ulceration of other part of foot: Secondary | ICD-10-CM

## 2024-05-02 NOTE — Progress Notes (Signed)
 POST OPERATIVE OFFICE NOTE    CC:  F/u for surgery  HPI:  Danielle Morton is a 68 y.o. female who is here for repeat wound check.  She recently underwent right external iliac and common iliac artery stenting with right femoral endarterectomy on 03/27/2024 by Dr. Sheree.  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  At her first follow-up with our office she did not have significant improvement of her tissue loss.  She also had some slight dehiscence of her right groin incision without signs of infection.  She was encouraged to keep this area clean and dry and was placed on a course of antibiotics.  She returns today for follow-up.  She says that she is doing much better.  She believes her right groin incision is well-healed and denies any drainage from this area.  She has also been keeping her right fifth toe ulceration clean and dry.  This area has gotten much smaller.    No Known Allergies  Current Outpatient Medications  Medication Sig Dispense Refill   amLODipine  (NORVASC ) 5 MG tablet Take 1 tablet (5 mg total) by mouth daily. 90 tablet 1   aspirin  EC 81 MG tablet Take 81 mg by mouth daily. Swallow whole.     atorvastatin  (LIPITOR) 20 MG tablet Take 1 tablet (20 mg total) by mouth daily. 90 tablet 1   cephALEXin  (KEFLEX ) 500 MG capsule Take 1 capsule (500 mg total) by mouth 3 (three) times daily. (Patient not taking: Reported on 05/01/2024) 21 capsule 0   clopidogrel  (PLAVIX ) 75 MG tablet Take 1 tablet (75 mg total) by mouth daily at 6 (six) AM. 30 tablet 11   escitalopram  (LEXAPRO ) 5 MG tablet Take 1 tablet (5 mg total) by mouth daily. 90 tablet 1   Evolocumab  (REPATHA  SURECLICK) 140 MG/ML SOAJ Inject 140 mg into the skin every 14 (fourteen) days. 6 mL 3   hydrochlorothiazide  (HYDRODIURIL ) 25 MG tablet Take 1 tablet (25 mg total) by mouth daily. 90 tablet 1   oxyCODONE -acetaminophen  (PERCOCET/ROXICET) 5-325 MG tablet Take 1-2 tablets by mouth every 6 (six)  hours as needed for moderate pain (pain score 4-6). (Patient not taking: Reported on 05/01/2024) 15 tablet 0   No current facility-administered medications for this visit.     ROS:  See HPI  Physical Exam:   Incision: Right groin incision well-healed without signs of infection or hematoma Extremities: Improved and dry appearance of right fifth toe ulceration.  Brisk right AT and peroneal Doppler signals Neuro: Intact motor and sensation of right lower extremity     Assessment/Plan:  This is a 68 y.o. female who is here for repeat wound check  - The patient continues to heal from previous right iliac artery stenting and femoral endarterectomy.  Her right groin incision is now well-healed without any further drainage -There is a significantly improved appearance of her right fifth toe ulceration.  This area is dry without any drainage.  Her wound is much smaller and will hopefully continue to heal without further vascular intervention -Her right lower extremity is well-perfused with brisk AT and peroneal Doppler signals -I have encouraged her to continue to keep her right foot clean and dry.  She will continue to paint her wound with Betadine daily. -She can follow-up with our office in 3 to 4 weeks for repeat wound check.  She is aware that if this wound worsens or does not heal, she may ultimately require right lower  extremity bypass   Ahmed Holster, PA-C [facility] [phone]   Clinic MD:  Sheree

## 2024-05-03 ENCOUNTER — Encounter (HOSPITAL_COMMUNITY): Payer: Self-pay

## 2024-05-05 ENCOUNTER — Encounter (INDEPENDENT_AMBULATORY_CARE_PROVIDER_SITE_OTHER): Payer: Self-pay | Admitting: Primary Care

## 2024-05-08 ENCOUNTER — Encounter: Payer: Self-pay | Admitting: Podiatry

## 2024-05-08 ENCOUNTER — Ambulatory Visit (INDEPENDENT_AMBULATORY_CARE_PROVIDER_SITE_OTHER): Admitting: Podiatry

## 2024-05-08 VITALS — BP 115/72 | HR 63 | Temp 98.4°F

## 2024-05-08 DIAGNOSIS — I739 Peripheral vascular disease, unspecified: Secondary | ICD-10-CM | POA: Diagnosis not present

## 2024-05-08 DIAGNOSIS — L97512 Non-pressure chronic ulcer of other part of right foot with fat layer exposed: Secondary | ICD-10-CM

## 2024-05-08 NOTE — Progress Notes (Signed)
 Subjective:  Patient ID: Danielle Morton, female    DOB: July 01, 1956,   MRN: [id number]  Chief Complaint  Patient presents with   Wound Check    It's doing fine.  Looks like it is well.    68 y.o. female presents for follow-up of wound on right foot. Relates she is doing well and its been looking good.  Has had endarterectomy on 7/8 successfully. There is still concern for inline flow to her foot being diminished and vascular mentions option for bypass or further intervention if struggle to heal wound on foot. . Denies any other pedal complaints. Denies n/v/f/c.   Past Medical History:  Diagnosis Date   Elevated coronary artery calcium  score 03/22/2022   1179 (99th percentile)   Hypertension    Mesenteric ischemia (HCC)    PVD (peripheral vascular disease) (HCC)     Objective:  Physical Exam: Vascular: DP/PT pulses non palpable bilateral. CFT <5 seconds. Absent hair growth on digits. Edema noted to bilateral lower extremities. Xerosis noted bilaterally.  Skin. No lacerations or abrasions bilateral feet. Nails 1-5 bilateral  are thickened discolored and elongated with subungual debris. Hyperkeratotic lesion noted lateral fifth digit upon debridement open ulceration noted with more bleeding noted and granular base. No erythema edema or purulence noted.   Musculoskeletal: MMT 5/5 bilateral lower extremities in DF, PF, Inversion and Eversion. Deceased ROM in DF of ankle joint.  Neurological: Sensation intact to light touch. Protective sensation diminished bilateral.    +-------+-----------+-----------+------------+------------+  ABI/TBIToday's ABIToday's TBIPrevious ABIPrevious TBI  +-------+-----------+-----------+------------+------------+  Right 0.0        0.0                                  +-------+-----------+-----------+------------+------------+  Left  .55        .52                                  +-------+-----------+-----------+------------+------------+        TOES Findings:  +----------+---------------+--------+-------+  Right ToesPressure (mmHg)WaveformComment  +----------+---------------+--------+-------+  1st Digit                Absent           +----------+---------------+--------+-------+  2nd Digit                Absent           +----------+---------------+--------+-------+  3rd Digit                Absent           +----------+---------------+--------+-------+  4th Digit                Absent           +----------+---------------+--------+-------+  5th Digit                Absent           +----------+---------------+--------+-------+      +---------+---------------+--------+------------------+  Left ToesPressure (mmHg)WaveformComment             +---------+---------------+--------+------------------+  1st Digit               AbnormalSevere              +---------+---------------+--------+------------------+  2nd Digit               AbnormalModerate to Severe  +---------+---------------+--------+------------------+  3rd  Digit               AbnormalModerate            +---------+---------------+--------+------------------+  4th Digit               AbnormalModerate to Severe  +---------+---------------+--------+------------------+  5th Digit               AbnormalSevere              +---------+---------------+--------+------------------+           Findings reported to Asberry Failing, DPM at 11:50 am.   Summary:  Right: Resting right ankle-brachial index indicates critical limb  ischemia. The right toe-brachial index is abnormal.   Left: Resting left ankle-brachial index indicates moderate left lower  extremity arterial disease. The left toe-brachial index is abnormal.   Assessment:   No diagnosis found.      Plan:  Ulcer lateral fifth metatarsal head with fat layer exposed  -X-rays reviewed no osseous erosions noted. Edema noted around  ulceration site  -Debridement as below. More bleeding noted today and healthy granular wound base.  -Dressed with betadine, DSD. -Off-loading with surgical shoe.  -No additional antibiotics ordered today.  - Dr. Sheree preformed endarterectomy and femoral patch angioplasty on 7/8.  -Discussed glucose control and proper protein-rich diet.  -Discussed if any worsening redness, pain, fever or chills to call or may need to report to the emergency room. Patient expressed understanding.   Procedure: Excisional Debridement of Wound Rationale: Removal of non-viable soft tissue from the wound to promote healing.  Anesthesia: none Pre-Debridement Wound Measurements: Overlying callus  Post-Debridement Wound Measurements: 0.2 cm x 0.2 cm x 0.2 cm  Type of Debridement: Sharp Excisional Tissue Removed: Non-viable soft tissue Depth of Debridement: subcutaneous tissue. Technique: Sharp excisional debridement to bleeding, viable wound base.  Dressing: Dry, sterile, compression dressing. Disposition: Patient tolerated procedure well. Patient to return in 2 week for follow-up.  No follow-ups on file.   Return 2 weeks for wound check.   No follow-ups on file.    Asberry Failing, DPM

## 2024-05-23 ENCOUNTER — Ambulatory Visit: Attending: Vascular Surgery | Admitting: Physician Assistant

## 2024-05-23 ENCOUNTER — Telehealth: Payer: Self-pay | Admitting: Licensed Clinical Social Worker

## 2024-05-23 VITALS — BP 105/70 | HR 48 | Temp 97.8°F | Wt 122.0 lb

## 2024-05-23 DIAGNOSIS — I7025 Atherosclerosis of native arteries of other extremities with ulceration: Secondary | ICD-10-CM

## 2024-05-23 DIAGNOSIS — I70235 Atherosclerosis of native arteries of right leg with ulceration of other part of foot: Secondary | ICD-10-CM

## 2024-05-23 NOTE — Telephone Encounter (Signed)
 H&V Care Navigation CSW Progress Note  Clinical Social Worker received inquiry about cab voucher to assist pt with getting home today from clinic due to patient access team waiting 30 mins on hold with [facility]. LCSW notes pt has [employer] Dual Complete. Called [phone] option 1 and confirmed pt had already scheduled ride home, just needed to be updated that she was ready for return ride. Ride to pick pt up at 10:25am, will text her phone with details about ride. Remain available for any other concerns.  Patient is participating in a Managed Medicaid Plan:  No, [employer] Dual Complete  SDOH Screenings   Food Insecurity: No Food Insecurity (04/03/2024)  Housing: Low Risk  (04/03/2024)  Transportation Needs: Unmet Transportation Needs (04/03/2024)  Utilities: Not At Risk (04/03/2024)  Alcohol Screen: Low Risk  (04/03/2024)  Depression (PHQ2-9): Low Risk  (05/01/2024)  Financial Resource Strain: Low Risk  (04/03/2024)  Physical Activity: Inactive (04/03/2024)  Social Connections: Unknown (04/03/2024)  Stress: No Stress Concern Present (04/03/2024)  Tobacco Use: Medium Risk (05/23/2024)    Marit Lark, MSW, LCSW Clinical Social Worker II [facility]/Vascular Care Navigation  [phone]- work cell phone (preferred)

## 2024-05-23 NOTE — Progress Notes (Signed)
  POST OPERATIVE OFFICE NOTE    CC:  F/u for surgery  HPI:  This is a 68 y.o. female who is s/p right external and common iliac artery stenting with right femoral endarterectomy on 03/27/2024 by Dr. Sheree due to fifth toe tissue loss.  She believes the wound is slowly getting better.  She follows regularly with podiatry for wound care.  She is taking aspirin , Plavix , statin daily.  She unfortunately has not been cleansing her feet on a regular basis.  She denies tobacco use.  Right groin incision has completely healed.  No Known Allergies  Current Outpatient Medications  Medication Sig Dispense Refill   amLODipine  (NORVASC ) 5 MG tablet Take 1 tablet (5 mg total) by mouth daily. 90 tablet 1   aspirin  EC 81 MG tablet Take 81 mg by mouth daily. Swallow whole.     atorvastatin  (LIPITOR) 20 MG tablet Take 1 tablet (20 mg total) by mouth daily. 90 tablet 1   clopidogrel  (PLAVIX ) 75 MG tablet Take 1 tablet (75 mg total) by mouth daily at 6 (six) AM. 30 tablet 11   escitalopram  (LEXAPRO ) 5 MG tablet Take 1 tablet (5 mg total) by mouth daily. 90 tablet 1   Evolocumab  (REPATHA  SURECLICK) 140 MG/ML SOAJ Inject 140 mg into the skin every 14 (fourteen) days. 6 mL 3   hydrochlorothiazide  (HYDRODIURIL ) 25 MG tablet Take 1 tablet (25 mg total) by mouth daily. 90 tablet 1   No current facility-administered medications for this visit.     ROS:  See HPI  Physical Exam:  Vitals:   05/23/24 0914  BP: 105/70  Pulse: (!) 48  Temp: 97.8 F (36.6 C)  TempSrc: Temporal  Weight: 122 lb (55.3 kg)    Incision: Right groin incision completely healed Extremities: Right fifth toe ulcer similar to appearance from last office visit; no purulence; brisk right AT by Doppler Neuro: A&O  Assessment/Plan:  This is a 68 y.o. female who is s/p: Right common and external iliac artery stenting with right femoral endarterectomy  Subjectively, fifth toe wound is slowly improving.  Continue current wound care with  podiatry.  Encouraged patient to cleanse the foot on a regular basis to remove dead skin cells.  Recommended against going to the salon for pedicure.  Continue aspirin , Plavix , statin daily.  We will check an aortoiliac duplex and ABIs in 4 to 6 weeks.  She is aware she would require bypass surgery if she is unable to heal this wound.   Donnice Sender, PA-C [facility] [phone]  Clinic MD:  Sheree

## 2024-05-24 ENCOUNTER — Other Ambulatory Visit: Payer: Self-pay | Admitting: *Deleted

## 2024-05-24 DIAGNOSIS — I70235 Atherosclerosis of native arteries of right leg with ulceration of other part of foot: Secondary | ICD-10-CM

## 2024-05-24 DIAGNOSIS — I7025 Atherosclerosis of native arteries of other extremities with ulceration: Secondary | ICD-10-CM

## 2024-05-25 ENCOUNTER — Other Ambulatory Visit: Payer: Self-pay

## 2024-05-28 ENCOUNTER — Ambulatory Visit (INDEPENDENT_AMBULATORY_CARE_PROVIDER_SITE_OTHER): Admitting: Podiatry

## 2024-05-28 DIAGNOSIS — Z91199 Patient's noncompliance with other medical treatment and regimen due to unspecified reason: Secondary | ICD-10-CM

## 2024-05-28 NOTE — Progress Notes (Signed)
 No show

## 2024-05-29 ENCOUNTER — Other Ambulatory Visit: Payer: Self-pay

## 2024-05-29 DIAGNOSIS — I70235 Atherosclerosis of native arteries of right leg with ulceration of other part of foot: Secondary | ICD-10-CM

## 2024-05-29 DIAGNOSIS — I7025 Atherosclerosis of native arteries of other extremities with ulceration: Secondary | ICD-10-CM

## 2024-05-30 ENCOUNTER — Ambulatory Visit: Admitting: Internal Medicine

## 2024-05-31 ENCOUNTER — Telehealth (INDEPENDENT_AMBULATORY_CARE_PROVIDER_SITE_OTHER): Payer: Self-pay | Admitting: Primary Care

## 2024-05-31 NOTE — Telephone Encounter (Signed)
 Called pt to confirm appt. Pt will be present.

## 2024-06-01 ENCOUNTER — Encounter (INDEPENDENT_AMBULATORY_CARE_PROVIDER_SITE_OTHER): Payer: Self-pay | Admitting: Primary Care

## 2024-06-01 ENCOUNTER — Ambulatory Visit (INDEPENDENT_AMBULATORY_CARE_PROVIDER_SITE_OTHER): Admitting: Primary Care

## 2024-06-01 VITALS — BP 131/78 | HR 63 | Ht 64.0 in | Wt 122.2 lb

## 2024-06-01 DIAGNOSIS — Z1211 Encounter for screening for malignant neoplasm of colon: Secondary | ICD-10-CM | POA: Diagnosis not present

## 2024-06-01 DIAGNOSIS — E2839 Other primary ovarian failure: Secondary | ICD-10-CM | POA: Diagnosis not present

## 2024-06-01 DIAGNOSIS — I10 Essential (primary) hypertension: Secondary | ICD-10-CM | POA: Diagnosis not present

## 2024-06-01 DIAGNOSIS — Z720 Tobacco use: Secondary | ICD-10-CM | POA: Diagnosis not present

## 2024-06-01 DIAGNOSIS — Z23 Encounter for immunization: Secondary | ICD-10-CM

## 2024-06-01 DIAGNOSIS — H6123 Impacted cerumen, bilateral: Secondary | ICD-10-CM

## 2024-06-01 NOTE — Progress Notes (Signed)
 [facility]  Danielle Morton, is a 68 y.o. female  RDW:[id number]  FMW:[id number]  DOB - 1956/01/08  Chief Complaint  Patient presents with   Hypertension       Subjective:   Danielle Morton is a 68 y.o. female here today for a follow up visit HTN. Patient has No headache, No chest pain, No abdominal pain - No Nausea, No new weakness tingling or numbness, No Cough - shortness of breath. Lexapro  remains affective at current dose.   No problems updated.  Comprehensive ROS Pertinent positive and negative noted in HPI   No Known Allergies  Past Medical History:  Diagnosis Date   Elevated coronary artery calcium  score 03/22/2022   1179 (99th percentile)   Hypertension    Mesenteric ischemia (HCC)    PVD (peripheral vascular disease) (HCC)     Current Outpatient Medications on File Prior to Visit  Medication Sig Dispense Refill   amLODipine  (NORVASC ) 5 MG tablet Take 1 tablet (5 mg total) by mouth daily. 90 tablet 1   aspirin  EC 81 MG tablet Take 81 mg by mouth daily. Swallow whole.     atorvastatin  (LIPITOR) 20 MG tablet Take 1 tablet (20 mg total) by mouth daily. 90 tablet 1   clopidogrel  (PLAVIX ) 75 MG tablet Take 1 tablet (75 mg total) by mouth daily at 6 (six) AM. 30 tablet 11   escitalopram  (LEXAPRO ) 5 MG tablet Take 1 tablet (5 mg total) by mouth daily. 90 tablet 1   Evolocumab  (REPATHA  SURECLICK) 140 MG/ML SOAJ Inject 140 mg into the skin every 14 (fourteen) days. 6 mL 3   hydrochlorothiazide  (HYDRODIURIL ) 25 MG tablet Take 1 tablet (25 mg total) by mouth daily. 90 tablet 1   No current facility-administered medications on file prior to visit.   Health Maintenance  Topic Date Due   DTaP/Tdap/Td vaccine (1 - Tdap) Never done   Zoster (Shingles) Vaccine (1 of 2) Never done   Colon Cancer Screening  Never done   Screening for Lung Cancer  Never done   DEXA scan (bone density measurement)  Never done   Flu Shot  04/20/2024   COVID-19 Vaccine (4 - 2025-26  season) 05/21/2024   Medicare Annual Wellness Visit  04/03/2025   Breast Cancer Screening  02/16/2026   Pneumococcal Vaccine for age over 43  Completed   Hepatitis C Screening  Completed   HPV Vaccine  Aged Out   Meningitis B Vaccine  Aged Out    Objective:   Vitals:   06/01/24 0858  BP: 131/78  Pulse: 63  SpO2: 100%  Weight: 122 lb 3.2 oz (55.4 kg)  Height: 5' 4 (1.626 m)   BP Readings from Last 3 Encounters:  06/01/24 131/78  05/23/24 105/70  05/08/24 115/72      Physical Exam Constitutional:      Appearance: Normal appearance.  HENT:     Head: Normocephalic.     Right Ear: External ear normal. There is impacted cerumen.     Left Ear: External ear normal. There is impacted cerumen.     Nose: Nose normal.  Eyes:     Extraocular Movements: Extraocular movements intact.  Cardiovascular:     Rate and Rhythm: Normal rate and regular rhythm.  Pulmonary:     Effort: Pulmonary effort is normal.     Breath sounds: Normal breath sounds.  Abdominal:     General: Abdomen is flat. Bowel sounds are normal.  Musculoskeletal:     Cervical back: Normal range  of motion.     Comments: Right foot remains in a una boot  Skin:    General: Skin is warm and dry.  Neurological:     Mental Status: She is alert and oriented to person, place, and time.  Psychiatric:        Mood and Affect: Mood normal.        Behavior: Behavior normal.     Assessment & Plan  Danielle Morton was seen today for hypertension.  Diagnoses and all orders for this visit:  Essential hypertension  Well control   Estrogen deficiency Order Bone density   Tobacco abuse -     Ambulatory Referral for Lung Cancer Screening [REF832]  Colon cancer screening -     Ambulatory referral to Gastroenterology  Bilateral impacted cerumen Return for ear irrigation     Patient have been counseled extensively about nutrition and exercise. Other issues discussed during this visit include: low cholesterol diet, weight  control and daily exercise, foot care, annual eye examinations at Ophthalmology, importance of adherence with medications and regular follow-up. We also discussed long term complications of uncontrolled diabetes and hypertension.   No follow-ups on file.  The patient was given clear instructions to go to ER or return to medical center if symptoms don't improve, worsen or new problems develop. The patient verbalized understanding. The patient was told to call to get lab results if they haven't heard anything in the next week.   This note has been created with Education officer, environmental. Any transcriptional errors are unintentional.   Danielle SHAUNNA Bohr, NP 06/01/2024, 9:28 AM

## 2024-06-11 ENCOUNTER — Ambulatory Visit: Admitting: Podiatry

## 2024-06-19 ENCOUNTER — Ambulatory Visit (INDEPENDENT_AMBULATORY_CARE_PROVIDER_SITE_OTHER): Admitting: Podiatry

## 2024-06-19 DIAGNOSIS — I739 Peripheral vascular disease, unspecified: Secondary | ICD-10-CM

## 2024-06-19 DIAGNOSIS — L97511 Non-pressure chronic ulcer of other part of right foot limited to breakdown of skin: Secondary | ICD-10-CM

## 2024-06-19 NOTE — Progress Notes (Signed)
 Chief Complaint  Patient presents with   Wound Check    Right lateral foot wound check. 8 pain. Wearing surgical shoe. Upcoming surgery 06/27/24. Non diabetic. Applying iodine .   HPI: 68 y.o. female presenting today for follow-up of lateral right fifth metatarsal head ulceration.  She is scheduled for a revascularization procedure within the next 2 weeks to restore blood flow to the foot.  She has previously been diagnosed with critical limb ischemia.  She does note the area of the ulceration is painful but not as tender as it was earlier this summer.  She continues to wear her surgical shoe daily and keep it covered with a gauze dressing.  She states she has plenty of wound supplies at home.  Denies fever, chills, night sweats.  Past Medical History:  Diagnosis Date   Elevated coronary artery calcium  score 03/22/2022   1179 (99th percentile)   Hypertension    Mesenteric ischemia    PVD (peripheral vascular disease)    Past Surgical History:  Procedure Laterality Date   ABDOMINAL AORTOGRAM W/LOWER EXTREMITY N/A 03/12/2024   Procedure: ABDOMINAL AORTOGRAM W/LOWER EXTREMITY;  Surgeon: Sheree Penne Bruckner, MD;  Location: [facility] INVASIVE CV LAB;  Service: Cardiovascular;  Laterality: N/A;   ANGIOPLASTY N/A 12/28/2021   Procedure: BALLOON ANGIOPLASTY OF SUPERIOR MESENTERIC ARTERY;  Surgeon: Lanis Fonda BRAVO, MD;  Location: [facility] OR;  Service: Vascular;  Laterality: N/A;   ENDARTERECTOMY FEMORAL Left 08/15/2018   Procedure: ENDARTERECTOMY COMMON FEMORAL;  Surgeon: Sheree Penne Bruckner, MD;  Location: [facility] OR;  Service: Vascular;  Laterality: Left;   ENDARTERECTOMY FEMORAL Right 03/27/2024   Procedure: ENDARTERECTOMY, FEMORAL WITH PATCH ANGIOPLASTY;  Surgeon: Sheree Penne Bruckner, MD;  Location: [facility] OR;  Service: Vascular;  Laterality: Right;   INSERTION OF ILIAC STENT Left 08/15/2018   Procedure: INSERTION OF ILIAC STENT RETROGRADE;  Surgeon: Sheree Penne Bruckner, MD;  Location: [facility]  OR;  Service: Vascular;  Laterality: Left;   INSERTION OF ILIAC STENT Right 03/27/2024   Procedure: INSERTION, STENT, ARTERY, COMMON AND EXTERNAL ILIAC;  Surgeon: Sheree Penne Bruckner, MD;  Location: [facility] OR;  Service: Vascular;  Laterality: Right;   LOWER EXTREMITY ANGIOGRAM Right 03/27/2024   Procedure: GERALYN, LOWER EXTREMITY;  Surgeon: Sheree Penne Bruckner, MD;  Location: [facility] OR;  Service: Vascular;  Laterality: Right;   LOWER EXTREMITY ANGIOGRAPHY N/A 08/14/2018   Procedure: LOWER EXTREMITY ANGIOGRAPHY;  Surgeon: Sheree Penne Bruckner, MD;  Location: [facility] INVASIVE CV LAB;  Service: Cardiovascular;  Laterality: N/A;   LOWER EXTREMITY ANGIOGRAPHY N/A 03/12/2024   Procedure: Lower Extremity Angiography;  Surgeon: Sheree Penne Bruckner, MD;  Location: [facility] INVASIVE CV LAB;  Service: Cardiovascular;  Laterality: N/A;   LOWER EXTREMITY INTERVENTION N/A 03/12/2024   Procedure: LOWER EXTREMITY INTERVENTION;  Surgeon: Sheree Penne Bruckner, MD;  Location: [facility] INVASIVE CV LAB;  Service: Cardiovascular;  Laterality: N/A;   MESENTERIC ARTERY BYPASS Left 12/28/2021   Procedure: MESENTERIC ANGIOGRAPHY FROM LEFT BRACHIAL ARTERY;  Surgeon: Lanis Fonda BRAVO, MD;  Location: [facility] OR;  Service: Vascular;  Laterality: Left;   No Known Allergies   PHYSICAL EXAM: General: The patient is alert and oriented x3 in no acute distress.  Dermatology: Skin is warm, dry and supple bilateral lower extremities. Interspaces are clear of maceration and debris.      Wound 1:  Location: Lateral right fifth metatarsal head        Depth: Partial-thickness to dermis        Wound Border: Hyperkeratotic  Wound Base: Escharotic        Drainage: None       Odor?:  None        Surrounding Tissue: No erythema or edema        Infected?:  No        Necrosis?:  No        Pain?:  Yes        Tunneling: No       Dimensions (cm): 0.3 x 0.2 x 0.1 cm postdebridement  Vascular: Pedal pulses are nonpalpable.  No edema is  appreciated.  No calor is noted.  Neurological: Light touch sensation intact  ASSESSMENT / PLAN OF CARE: 1. Ulcer of right foot limited to breakdown of skin (HCC)   2. Peripheral arterial disease     The ulceration was sharply debrided of hyperkeratotic and devitalized soft tissue with sterile #312 blade to the level of dermis.  Hemostasis obtained.  Antibiotic ointment and DSD applied.  Reviewed off-loading with patient.  Continue with surgical shoe at all times weightbearing  Reviewed daily dressing changes with patient.  Will try to arrange her next appointment with Dr. Joya for follow-up of ulceration around her upcoming revascularization procedure  Discussed risks / concerns regarding ulcer with patient and possible sequelae if left untreated.  Stressed importance of infection prevention at home. Short-term goals are: prevent infection, off-load ulcer, heal ulcer Long-term goals are:  prevent recurrence, prevent amputation.   Return in about 3 weeks (around 07/10/2024) for f/u ulcer with Dr. Sikora.   Awanda CHARM Imperial, DPM, FACFAS [facility]     [address] [place], [place] [address]                Office [phone]  Fax [phone]

## 2024-06-27 ENCOUNTER — Encounter (HOSPITAL_COMMUNITY)

## 2024-06-27 ENCOUNTER — Ambulatory Visit

## 2024-07-10 ENCOUNTER — Ambulatory Visit (INDEPENDENT_AMBULATORY_CARE_PROVIDER_SITE_OTHER): Admitting: Podiatry

## 2024-07-10 ENCOUNTER — Encounter: Payer: Self-pay | Admitting: Podiatry

## 2024-07-10 VITALS — BP 123/73 | HR 64 | Temp 98.6°F

## 2024-07-10 DIAGNOSIS — I739 Peripheral vascular disease, unspecified: Secondary | ICD-10-CM

## 2024-07-10 DIAGNOSIS — L84 Corns and callosities: Secondary | ICD-10-CM | POA: Diagnosis not present

## 2024-07-10 NOTE — Progress Notes (Signed)
 Subjective:  Patient ID: Danielle Morton, female    DOB: 21-Dec-1955,   MRN: [id number]  Chief Complaint  Patient presents with   Wound Check    It's alright.  I guess it will be alright if I have that surgery.    68 y.o. female presents for follow-up of wound on right foot. Relates she is doing well and its been looking good.  Has had endarterectomy on 7/8 successfully. There is still concern for inline flow to her foot being diminished and vascular mentions option for bypass or further intervention if struggle to heal wound on foot. . Denies any other pedal complaints. Denies n/v/f/c.   Past Medical History:  Diagnosis Date   Elevated coronary artery calcium  score 03/22/2022   1179 (99th percentile)   Hypertension    Mesenteric ischemia    PVD (peripheral vascular disease)     Objective:  Physical Exam: Vascular: DP/PT pulses non palpable bilateral. CFT <5 seconds. Absent hair growth on digits. Edema noted to bilateral lower extremities. Xerosis noted bilaterally.  Skin. No lacerations or abrasions bilateral feet. Nails 1-5 bilateral  are thickened discolored and elongated with subungual debris. Hyperkeratotic lesion noted lateral fifth digit upon debridement healed Musculoskeletal: MMT 5/5 bilateral lower extremities in DF, PF, Inversion and Eversion. Deceased ROM in DF of ankle joint.  Neurological: Sensation intact to light touch. Protective sensation diminished bilateral.    +-------+-----------+-----------+------------+------------+  ABI/TBIToday's ABIToday's TBIPrevious ABIPrevious TBI  +-------+-----------+-----------+------------+------------+  Right 0.0        0.0                                  +-------+-----------+-----------+------------+------------+  Left  .55        .52                                  +-------+-----------+-----------+------------+------------+       TOES Findings:  +----------+---------------+--------+-------+  Right  ToesPressure (mmHg)WaveformComment  +----------+---------------+--------+-------+  1st Digit                Absent           +----------+---------------+--------+-------+  2nd Digit                Absent           +----------+---------------+--------+-------+  3rd Digit                Absent           +----------+---------------+--------+-------+  4th Digit                Absent           +----------+---------------+--------+-------+  5th Digit                Absent           +----------+---------------+--------+-------+      +---------+---------------+--------+------------------+  Left ToesPressure (mmHg)WaveformComment             +---------+---------------+--------+------------------+  1st Digit               AbnormalSevere              +---------+---------------+--------+------------------+  2nd Digit               AbnormalModerate to Severe  +---------+---------------+--------+------------------+  3rd Digit  AbnormalModerate            +---------+---------------+--------+------------------+  4th Digit               AbnormalModerate to Severe  +---------+---------------+--------+------------------+  5th Digit               AbnormalSevere              +---------+---------------+--------+------------------+           Findings reported to Asberry Failing, DPM at 11:50 am.   Summary:  Right: Resting right ankle-brachial index indicates critical limb  ischemia. The right toe-brachial index is abnormal.   Left: Resting left ankle-brachial index indicates moderate left lower  extremity arterial disease. The left toe-brachial index is abnormal.   Assessment:   1. Ulcer of right foot limited to breakdown of skin (HCC)   2. Peripheral arterial disease         Plan:  Ulcer lateral fifth metatarsal head with fat layer exposed  -X-rays reviewed no osseous erosions noted. Edema noted around ulceration  site  -Debridement of hyperkeratotic tissue with underlying ulceration healed.  Used a chisel.  As courtesy -Dressed with Band-Aid - Weightbearing as tolerated in regular shoe -No additional antibiotics ordered today.  - Dr. Sheree preformed endarterectomy and femoral patch angioplasty on 7/8.  -Discussed glucose control and proper protein-rich diet.  -Discussed if any worsening redness, pain, fever or chills to call or may need to report to the emergency room. Patient expressed understanding.  Return in 4 weeks for recheck  No follow-ups on file.    No follow-ups on file.    Asberry Failing, DPM

## 2024-07-12 NOTE — Progress Notes (Unsigned)
 HISTORY AND PHYSICAL     CC:  follow up. Requesting Provider:  Celestia Rosaline SQUIBB, NP  HPI: This is a 68 y.o. female who is here today for follow up for PAD.  Pt has hx of left CFA and profunda femoral endarterectomy with bovine pericardial patch angioplasty and stent of left CIA 08/15/2018 by Dr. Sheree for LLE CLI.  On 12/28/2021, she underwent percutaneous mechanical thrombectomy of the SMA and balloon angioplasty of the distal SMA with brachial artery cutdown by Dr. Sheree for mesenteric ischemia.  On 03/27/2024, she underwent extensive right iliofemoral endarterectomy including EIA, CFA, medial circumflex and profunda femoral arteries and SFA with bovine patch angioplasty and stent of the right CIA and EIA for CLI with toe ulceration.  Pt was last seen 05/23/2024 and at that time, she was doing well.  Her right groin incision had healed. She was compliant with her asa / statin / plavix . Her right 5th toe wound was slowing improving.  She was aware that if the wound wound not heal, she would need bypass surgery.   She was last seen by podiatry this week and she still has lateral 5th metatarsal head ulcer with fat layer exposed.   The pt returns today for follow up.  ***  The pt is on a statin for cholesterol management.    The pt is on an aspirin .    Other AC:  Plavix  The pt is on CCB, diuretic for hypertension.  The pt is not on diabetic medication. Tobacco hx:  former  Pt does *** have family hx of AAA.  Past Medical History:  Diagnosis Date   Elevated coronary artery calcium  score 03/22/2022   1179 (99th percentile)   Hypertension    Mesenteric ischemia    PVD (peripheral vascular disease)     Past Surgical History:  Procedure Laterality Date   ABDOMINAL AORTOGRAM W/LOWER EXTREMITY N/A 03/12/2024   Procedure: ABDOMINAL AORTOGRAM W/LOWER EXTREMITY;  Surgeon: Sheree Penne Bruckner, MD;  Location: [facility] INVASIVE CV LAB;  Service: Cardiovascular;  Laterality: N/A;   ANGIOPLASTY N/A  12/28/2021   Procedure: BALLOON ANGIOPLASTY OF SUPERIOR MESENTERIC ARTERY;  Surgeon: Lanis Fonda BRAVO, MD;  Location: [facility] OR;  Service: Vascular;  Laterality: N/A;   ENDARTERECTOMY FEMORAL Left 08/15/2018   Procedure: ENDARTERECTOMY COMMON FEMORAL;  Surgeon: Sheree Penne Bruckner, MD;  Location: [facility] OR;  Service: Vascular;  Laterality: Left;   ENDARTERECTOMY FEMORAL Right 03/27/2024   Procedure: ENDARTERECTOMY, FEMORAL WITH PATCH ANGIOPLASTY;  Surgeon: Sheree Penne Bruckner, MD;  Location: [facility] OR;  Service: Vascular;  Laterality: Right;   INSERTION OF ILIAC STENT Left 08/15/2018   Procedure: INSERTION OF ILIAC STENT RETROGRADE;  Surgeon: Sheree Penne Bruckner, MD;  Location: [facility] OR;  Service: Vascular;  Laterality: Left;   INSERTION OF ILIAC STENT Right 03/27/2024   Procedure: INSERTION, STENT, ARTERY, COMMON AND EXTERNAL ILIAC;  Surgeon: Sheree Penne Bruckner, MD;  Location: [facility] OR;  Service: Vascular;  Laterality: Right;   LOWER EXTREMITY ANGIOGRAM Right 03/27/2024   Procedure: GERALYN, LOWER EXTREMITY;  Surgeon: Sheree Penne Bruckner, MD;  Location: [facility] OR;  Service: Vascular;  Laterality: Right;   LOWER EXTREMITY ANGIOGRAPHY N/A 08/14/2018   Procedure: LOWER EXTREMITY ANGIOGRAPHY;  Surgeon: Sheree Penne Bruckner, MD;  Location: [facility] INVASIVE CV LAB;  Service: Cardiovascular;  Laterality: N/A;   LOWER EXTREMITY ANGIOGRAPHY N/A 03/12/2024   Procedure: Lower Extremity Angiography;  Surgeon: Sheree Penne Bruckner, MD;  Location: [facility] INVASIVE CV LAB;  Service: Cardiovascular;  Laterality: N/A;   LOWER  EXTREMITY INTERVENTION N/A 03/12/2024   Procedure: LOWER EXTREMITY INTERVENTION;  Surgeon: Sheree Penne Bruckner, MD;  Location: [facility] INVASIVE CV LAB;  Service: Cardiovascular;  Laterality: N/A;   MESENTERIC ARTERY BYPASS Left 12/28/2021   Procedure: MESENTERIC ANGIOGRAPHY FROM LEFT BRACHIAL ARTERY;  Surgeon: Lanis Fonda BRAVO, MD;  Location: [facility] OR;  Service: Vascular;  Laterality: Left;     No Known Allergies  Current Outpatient Medications  Medication Sig Dispense Refill   amLODipine  (NORVASC ) 5 MG tablet Take 1 tablet (5 mg total) by mouth daily. 90 tablet 1   aspirin  EC 81 MG tablet Take 81 mg by mouth daily. Swallow whole.     atorvastatin  (LIPITOR) 20 MG tablet Take 1 tablet (20 mg total) by mouth daily. 90 tablet 1   clopidogrel  (PLAVIX ) 75 MG tablet Take 1 tablet (75 mg total) by mouth daily at 6 (six) AM. 30 tablet 11   escitalopram  (LEXAPRO ) 5 MG tablet Take 1 tablet (5 mg total) by mouth daily. 90 tablet 1   Evolocumab  (REPATHA  SURECLICK) 140 MG/ML SOAJ Inject 140 mg into the skin every 14 (fourteen) days. 6 mL 3   hydrochlorothiazide  (HYDRODIURIL ) 25 MG tablet Take 1 tablet (25 mg total) by mouth daily. 90 tablet 1   No current facility-administered medications for this visit.    Family History  Problem Relation Age of Onset   Colon cancer Neg Hx    Pancreatic cancer Neg Hx    Stomach cancer Neg Hx    Esophageal cancer Neg Hx     Social History   Socioeconomic History   Marital status: Single    Spouse name: Not on file   Number of children: 1   Years of education: Not on file   Highest education level: Not on file  Occupational History   Not on file  Tobacco Use   Smoking status: Former    Current packs/day: 0.00    Average packs/day: 1 pack/day for 30.0 years (30.0 ttl pk-yrs)    Types: Cigarettes    Quit date: 03/21/2019    Years since quitting: 5.3   Smokeless tobacco: Never  Vaping Use   Vaping status: Never Used  Substance and Sexual Activity   Alcohol use: Not Currently   Drug use: No   Sexual activity: Not on file  Other Topics Concern   Not on file  Social History Narrative   Not on file   Social Drivers of Health   Financial Resource Strain: Low Risk  (04/03/2024)   Overall Financial Resource Strain (CARDIA)    Difficulty of Paying Living Expenses: Not hard at all  Food Insecurity: No Food Insecurity (04/03/2024)   Hunger  Vital Sign    Worried About Running Out of Food in the Last Year: Never true    Ran Out of Food in the Last Year: Never true  Transportation Needs: Unmet Transportation Needs (04/03/2024)   PRAPARE - Transportation    Lack of Transportation (Medical): Yes    Lack of Transportation (Non-Medical): Yes  Physical Activity: Inactive (04/03/2024)   Exercise Vital Sign    Days of Exercise per Week: 0 days    Minutes of Exercise per Session: 0 min  Stress: No Stress Concern Present (04/03/2024)   [employer] of Occupational Health - Occupational Stress Questionnaire    Feeling of Stress: Not at all  Social Connections: Unknown (04/03/2024)   Social Connection and Isolation Panel    Frequency of Communication with Friends and Family: More than three times a  week    Frequency of Social Gatherings with Friends and Family: Three times a week    Attends Religious Services: 1 to 4 times per year    Active Member of Clubs or Organizations: No    Attends Banker Meetings: Never    Marital Status: Patient declined  Intimate Partner Violence: Not At Risk (04/03/2024)   Humiliation, Afraid, Rape, and Kick questionnaire    Fear of Current or Ex-Partner: No    Emotionally Abused: No    Physically Abused: No    Sexually Abused: No     REVIEW OF SYSTEMS:  *** [X]  denotes positive finding, [ ]  denotes negative finding Cardiac  Comments:  Chest pain or chest pressure:    Shortness of breath upon exertion:    Short of breath when lying flat:    Irregular heart rhythm:        Vascular    Pain in calf, thigh, or hip brought on by ambulation:    Pain in feet at night that wakes you up from your sleep:     Blood clot in your veins:    Leg swelling:         Pulmonary    Oxygen at home:    Productive cough:     Wheezing:         Neurologic    Sudden weakness in arms or legs:     Sudden numbness in arms or legs:     Sudden onset of difficulty speaking or slurred speech:     Temporary loss of vision in one eye:     Problems with dizziness:         Gastrointestinal    Blood in stool:     Vomited blood:         Genitourinary    Burning when urinating:     Blood in urine:        Psychiatric    Major depression:         Hematologic    Bleeding problems:    Problems with blood clotting too easily:        Skin    Rashes or ulcers:        Constitutional    Fever or chills:      PHYSICAL EXAMINATION:  ***  General:  WDWN in NAD; vital signs documented above Gait: Not observed HENT: WNL, normocephalic Pulmonary: normal non-labored breathing , without wheezing Cardiac: {Desc; regular/irreg:14544} HR, {With/Without:20273} carotid bruit*** Abdomen: soft, NT; aortic pulse is *** palpable Skin: {With/Without:20273} rashes Vascular Exam/Pulses:  Right Left  Radial {Exam; arterial pulse strength 0-4:30167} {Exam; arterial pulse strength 0-4:30167}  Femoral {Exam; arterial pulse strength 0-4:30167} {Exam; arterial pulse strength 0-4:30167}  Popliteal {Exam; arterial pulse strength 0-4:30167} {Exam; arterial pulse strength 0-4:30167}  DP {Exam; arterial pulse strength 0-4:30167} {Exam; arterial pulse strength 0-4:30167}  PT {Exam; arterial pulse strength 0-4:30167} {Exam; arterial pulse strength 0-4:30167}  Peroneal *** ***   Extremities: {With/Without:20273} ischemic changes, {With/Without:20273} Gangrene , {With/Without:20273} cellulitis; {With/Without:20273} open wounds Musculoskeletal: no muscle wasting or atrophy  Neurologic: A&O X 3 Psychiatric:  The pt has {Desc; normal/abnormal:11317::Normal} affect.   Non-Invasive Vascular Imaging:   ABI's/TBI's on 07/12/2024: Right:  *** - Great toe pressure: *** Left:  *** - Great toe pressure: ***  Arterial duplex on 07/12/2024: ***  Previous ABI's/TBI's on 5/28/205: Right:  0 - Great toe pressure: 0 Left:  0.55/0.52 - Great toe pressure:  88  Previous arterial duplex on ***: ***  Previous  carotid duplex 06/10/2023 (followed by Dr. Court) Bilateral 1-39% ICA stenosis   ASSESSMENT/PLAN:: 68 y.o. female here for follow up for PAD with hx of left CFA and profunda femoral endarterectomy with bovine pericardial patch angioplasty and stent of left CIA 08/15/2018 by Dr. Sheree for LLE CLI.  On 12/28/2021, she underwent percutaneous mechanical thrombectomy of the SMA and balloon angioplasty of the distal SMA with brachial artery cutdown by Dr. Sheree for mesenteric ischemia.  On 03/27/2024, she underwent extensive right iliofemoral endarterectomy including EIA, CFA, medial circumflex and profunda femoral arteries and SFA with bovine patch angioplasty and stent of the right CIA and EIA for CLI with toe ulceration.   -*** -continue asa/statin/plavix  -discussed importance of increased walking daily -pt will f/u in *** with ***.   Lucie Apt, [facility] [phone]  Clinic MD:   [place]

## 2024-07-13 ENCOUNTER — Ambulatory Visit (HOSPITAL_BASED_OUTPATIENT_CLINIC_OR_DEPARTMENT_OTHER)
Admission: RE | Admit: 2024-07-13 | Discharge: 2024-07-13 | Disposition: A | Discharge status: Home / Self Care | Source: Ambulatory Visit | Attending: Vascular Surgery | Admitting: Vascular Surgery

## 2024-07-13 ENCOUNTER — Other Ambulatory Visit (HOSPITAL_COMMUNITY): Payer: Self-pay

## 2024-07-13 ENCOUNTER — Ambulatory Visit (HOSPITAL_COMMUNITY)
Admission: RE | Admit: 2024-07-13 | Discharge: 2024-07-13 | Disposition: A | Discharge status: Home / Self Care | Source: Ambulatory Visit | Attending: Vascular Surgery | Admitting: Vascular Surgery

## 2024-07-13 ENCOUNTER — Other Ambulatory Visit: Payer: Self-pay

## 2024-07-13 ENCOUNTER — Ambulatory Visit (INDEPENDENT_AMBULATORY_CARE_PROVIDER_SITE_OTHER): Admitting: Physician Assistant

## 2024-07-13 ENCOUNTER — Other Ambulatory Visit (INDEPENDENT_AMBULATORY_CARE_PROVIDER_SITE_OTHER): Payer: Self-pay

## 2024-07-13 ENCOUNTER — Other Ambulatory Visit (INDEPENDENT_AMBULATORY_CARE_PROVIDER_SITE_OTHER): Payer: Self-pay | Admitting: Primary Care

## 2024-07-13 VITALS — BP 127/81 | HR 68 | Temp 97.7°F | Wt 128.7 lb

## 2024-07-13 DIAGNOSIS — I10 Essential (primary) hypertension: Secondary | ICD-10-CM

## 2024-07-13 DIAGNOSIS — I70235 Atherosclerosis of native arteries of right leg with ulceration of other part of foot: Secondary | ICD-10-CM | POA: Diagnosis not present

## 2024-07-13 DIAGNOSIS — I7025 Atherosclerosis of native arteries of other extremities with ulceration: Secondary | ICD-10-CM | POA: Diagnosis not present

## 2024-07-13 DIAGNOSIS — Z760 Encounter for issue of repeat prescription: Secondary | ICD-10-CM

## 2024-07-13 LAB — VAS US ABI WITH/WO TBI
Left ABI: 0.69
Right ABI: 0.47

## 2024-07-13 MED ORDER — AMLODIPINE BESYLATE 5 MG PO TABS
5.0000 mg | ORAL_TABLET | Freq: Every day | ORAL | 1 refills | Status: AC
  Filled 2024-07-13 (×2): qty 90, 90d supply, fill #0
  Filled 2024-09-28 (×2): qty 90, 90d supply, fill #1

## 2024-07-16 ENCOUNTER — Other Ambulatory Visit: Payer: Self-pay

## 2024-07-16 DIAGNOSIS — I7025 Atherosclerosis of native arteries of other extremities with ulceration: Secondary | ICD-10-CM

## 2024-07-24 ENCOUNTER — Ambulatory Visit: Admitting: Internal Medicine

## 2024-07-24 ENCOUNTER — Encounter: Payer: Self-pay | Admitting: Internal Medicine

## 2024-07-30 ENCOUNTER — Other Ambulatory Visit (INDEPENDENT_AMBULATORY_CARE_PROVIDER_SITE_OTHER): Payer: Self-pay | Admitting: Primary Care

## 2024-07-30 ENCOUNTER — Other Ambulatory Visit: Payer: Self-pay

## 2024-07-30 DIAGNOSIS — I10 Essential (primary) hypertension: Secondary | ICD-10-CM

## 2024-07-31 ENCOUNTER — Other Ambulatory Visit: Payer: Self-pay

## 2024-07-31 ENCOUNTER — Other Ambulatory Visit (HOSPITAL_COMMUNITY): Payer: Self-pay

## 2024-07-31 MED ORDER — HYDROCHLOROTHIAZIDE 25 MG PO TABS
25.0000 mg | ORAL_TABLET | Freq: Every day | ORAL | 0 refills | Status: AC
  Filled 2024-07-31: qty 90, 90d supply, fill #0

## 2024-07-31 NOTE — Telephone Encounter (Signed)
 Requested Prescriptions  Pending Prescriptions Disp Refills   hydrochlorothiazide  (HYDRODIURIL ) 25 MG tablet 90 tablet 0    Sig: Take 1 tablet (25 mg total) by mouth daily.     Cardiovascular: Diuretics - Thiazide Failed - 07/31/2024  4:16 PM      Failed - K in normal range and within 180 days    Potassium  Date Value Ref Range Status  03/29/2024 2.9 (L) 3.5 - 5.1 mmol/L Final         Passed - Cr in normal range and within 180 days    Creatinine, Ser  Date Value Ref Range Status  03/29/2024 0.51 0.44 - 1.00 mg/dL Final         Passed - Na in normal range and within 180 days    Sodium  Date Value Ref Range Status  03/29/2024 137 135 - 145 mmol/L Final  01/12/2024 140 134 - 144 mmol/L Final         Passed - Last BP in normal range    BP Readings from Last 1 Encounters:  07/13/24 127/81         Passed - Valid encounter within last 6 months    Recent Outpatient Visits           2 months ago Estrogen deficiency   [place] [facility] Celestia Rosaline SQUIBB, NP   3 months ago Hypotension due to drugs   [place] [facility] Celestia Rosaline SQUIBB, NP   6 months ago Encounter for immunization   [place] [facility] Celestia Rosaline SQUIBB, NP   1 year ago Encounter for immunization   [place] [facility] Celestia Rosaline SQUIBB, NP   1 year ago Adjustment disorder with mixed anxiety and depressed mood   [place] [facility] Celestia Rosaline SQUIBB, NP

## 2024-08-01 ENCOUNTER — Other Ambulatory Visit: Payer: Self-pay

## 2024-08-01 ENCOUNTER — Other Ambulatory Visit (HOSPITAL_COMMUNITY): Payer: Self-pay

## 2024-08-07 ENCOUNTER — Encounter: Payer: Self-pay | Admitting: Podiatry

## 2024-08-07 ENCOUNTER — Ambulatory Visit: Admitting: Podiatry

## 2024-08-07 DIAGNOSIS — M79674 Pain in right toe(s): Secondary | ICD-10-CM | POA: Diagnosis not present

## 2024-08-07 DIAGNOSIS — B351 Tinea unguium: Secondary | ICD-10-CM

## 2024-08-07 DIAGNOSIS — I739 Peripheral vascular disease, unspecified: Secondary | ICD-10-CM

## 2024-08-07 DIAGNOSIS — M79675 Pain in left toe(s): Secondary | ICD-10-CM | POA: Diagnosis not present

## 2024-08-07 DIAGNOSIS — L84 Corns and callosities: Secondary | ICD-10-CM

## 2024-08-07 NOTE — Progress Notes (Signed)
 Subjective:  Patient ID: Danielle Morton, female    DOB: [birth year],   MRN: [id number]  Chief Complaint  Patient presents with   Wound Check    It's alright, it still has a little twinge.  Every now and then, I may feel something.  She said she's going to cut my toenails today.    68 y.o. female presents for concern of thickened elongated and painful nails that are difficult to trim. Requesting to have them trimmed today. She has a history of PAD and ulceration.   There is still concern for inline flow to her foot being diminished and vascular mentions option for bypass or further intervention. . Denies any other pedal complaints. Denies n/v/f/c.   Past Medical History:  Diagnosis Date   Elevated coronary artery calcium  score 03/22/2022   1179 (99th percentile)   Hypertension    Mesenteric ischemia    PVD (peripheral vascular disease)     Objective:  Physical Exam: Vascular: DP/PT pulses non palpable bilateral. CFT <5 seconds. Absent hair growth on digits. Edema noted to bilateral lower extremities. Xerosis noted bilaterally.  Skin. No lacerations or abrasions bilateral feet. Nails 1-5 bilateral  are thickened discolored and elongated with subungual debris. Hyperkeratotic lesion noted lateral fifth digit upon debridement healed.  Several other core lesions noted to bilateral plantar feet.  In total about 4 lesions. Musculoskeletal: MMT 5/5 bilateral lower extremities in DF, PF, Inversion and Eversion. Deceased ROM in DF of ankle joint.  Neurological: Sensation intact to light touch. Protective sensation diminished bilateral.    +-------+-----------+-----------+------------+------------+  ABI/TBIToday's ABIToday's TBIPrevious ABIPrevious TBI  +-------+-----------+-----------+------------+------------+  Right 0.0        0.0                                  +-------+-----------+-----------+------------+------------+  Left  .55        .52                                   +-------+-----------+-----------+------------+------------+       TOES Findings:  +----------+---------------+--------+-------+  Right ToesPressure (mmHg)WaveformComment  +----------+---------------+--------+-------+  1st Digit                Absent           +----------+---------------+--------+-------+  2nd Digit                Absent           +----------+---------------+--------+-------+  3rd Digit                Absent           +----------+---------------+--------+-------+  4th Digit                Absent           +----------+---------------+--------+-------+  5th Digit                Absent           +----------+---------------+--------+-------+      +---------+---------------+--------+------------------+  Left ToesPressure (mmHg)WaveformComment             +---------+---------------+--------+------------------+  1st Digit               AbnormalSevere              +---------+---------------+--------+------------------+  2nd Digit  AbnormalModerate to Severe  +---------+---------------+--------+------------------+  3rd Digit               AbnormalModerate            +---------+---------------+--------+------------------+  4th Digit               AbnormalModerate to Severe  +---------+---------------+--------+------------------+  5th Digit               AbnormalSevere              +---------+---------------+--------+------------------+           Findings reported to Asberry Failing, DPM at 11:50 am.   Summary:  Right: Resting right ankle-brachial index indicates critical limb  ischemia. The right toe-brachial index is abnormal.   Left: Resting left ankle-brachial index indicates moderate left lower  extremity arterial disease. The left toe-brachial index is abnormal.   Assessment:   1. Pain due to onychomycosis of toenails of both feet   2. Pre-ulcerative calluses   3. Peripheral  arterial disease          Plan:  Ulcer lateral fifth metatarsal head healed -Discussed and educated patient on  foot care, especially with  regards to the vascular, neurological and musculoskeletal systems.  -Stressed the importance of good glycemic control and the detriment of not  controlling glucose levels in relation to the foot. -Discussed supportive shoes at all times and checking feet regularly.  -Mechanically debrided all nails 1-5 bilateral using sterile nail nipper and filed with dremel without incident  -Hyperkeratotic lesion to plantar fifth metatarsal and several other hyperkeratotic lesions to plantar foot debrided as courtesy without incident.  Discussed tips for preventing callus buildup.  As well as treatment options to help with the callus and keeping it down in between visits. -Answered all patient questions -Patient to return  in 3 months for at risk foot care -Patient advised to call the office if any problems or questions arise in the meantime.   Return in about 3 months (around 11/07/2024) for rfc.    Return in about 3 months (around 11/07/2024) for rfc.    Asberry Failing, DPM

## 2024-08-27 ENCOUNTER — Other Ambulatory Visit: Payer: Self-pay

## 2024-09-27 ENCOUNTER — Other Ambulatory Visit: Payer: Self-pay

## 2024-09-28 ENCOUNTER — Other Ambulatory Visit: Payer: Self-pay

## 2024-09-28 ENCOUNTER — Other Ambulatory Visit (HOSPITAL_COMMUNITY): Payer: Self-pay

## 2024-10-01 ENCOUNTER — Encounter: Payer: Self-pay | Admitting: Pharmacist

## 2024-10-01 ENCOUNTER — Other Ambulatory Visit: Payer: Self-pay

## 2024-10-01 ENCOUNTER — Encounter: Payer: Self-pay | Admitting: Internal Medicine

## 2024-10-02 ENCOUNTER — Other Ambulatory Visit: Payer: Self-pay

## 2024-10-04 ENCOUNTER — Other Ambulatory Visit: Payer: Self-pay

## 2024-10-05 ENCOUNTER — Other Ambulatory Visit: Payer: Self-pay

## 2024-10-09 ENCOUNTER — Other Ambulatory Visit: Payer: Self-pay

## 2024-10-09 ENCOUNTER — Encounter (HOSPITAL_COMMUNITY): Payer: Self-pay

## 2024-10-09 ENCOUNTER — Ambulatory Visit (INDEPENDENT_AMBULATORY_CARE_PROVIDER_SITE_OTHER)

## 2024-10-09 ENCOUNTER — Other Ambulatory Visit (HOSPITAL_COMMUNITY): Payer: Self-pay

## 2024-10-09 ENCOUNTER — Ambulatory Visit (HOSPITAL_COMMUNITY)
Admission: EM | Admit: 2024-10-09 | Discharge: 2024-10-09 | Disposition: A | Discharge status: Home / Self Care | Attending: Emergency Medicine | Admitting: Emergency Medicine

## 2024-10-09 DIAGNOSIS — M79622 Pain in left upper arm: Secondary | ICD-10-CM

## 2024-10-09 DIAGNOSIS — M25512 Pain in left shoulder: Secondary | ICD-10-CM | POA: Diagnosis not present

## 2024-10-09 MED ORDER — LIDOCAINE 5 % EX PTCH
1.0000 | MEDICATED_PATCH | CUTANEOUS | 0 refills | Status: AC
  Filled 2024-10-09 – 2024-10-12 (×5): qty 30, 30d supply, fill #0
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
## 2024-10-09 NOTE — Discharge Instructions (Addendum)
 Your X-rays are negative for any underlying fractures or injuries. I prescribed lidocaine  patches that you can apply for 12 hours at a time once daily for pain relief. You can also take 500 to 1000 mg of Tylenol  every 6-8 hours as needed for pain. Do not take NSAIDs including ibuprofen, Motrin, Advil, Aleve, and naproxen as you are currently on a blood thinner. Rest and ice periodically throughout the day. Follow-up with [facility] if your pain continues for further evaluation.
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
## 2024-10-10 ENCOUNTER — Other Ambulatory Visit: Payer: Self-pay

## 2024-10-12 ENCOUNTER — Other Ambulatory Visit (INDEPENDENT_AMBULATORY_CARE_PROVIDER_SITE_OTHER): Payer: Self-pay | Admitting: Primary Care

## 2024-10-12 ENCOUNTER — Other Ambulatory Visit: Payer: Self-pay

## 2024-10-12 DIAGNOSIS — I10 Essential (primary) hypertension: Secondary | ICD-10-CM

## 2024-10-12 DIAGNOSIS — F4323 Adjustment disorder with mixed anxiety and depressed mood: Secondary | ICD-10-CM

## 2024-10-12 NOTE — Telephone Encounter (Signed)
 Requested Prescriptions  Refused Prescriptions Disp Refills   escitalopram  (LEXAPRO ) 5 MG tablet 90 tablet 1    Sig: Take 1 tablet (5 mg total) by mouth daily.     Psychiatry:  Antidepressants - SSRI Passed - 10/12/2024  2:47 PM      Passed - Valid encounter within last 6 months    Recent Outpatient Visits           4 months ago Estrogen deficiency   [place] [facility] Celestia Rosaline SQUIBB, NP   5 months ago Hypotension due to drugs   [place] [facility] Celestia Rosaline SQUIBB, NP   9 months ago Encounter for immunization   [place] [facility] Celestia Rosaline SQUIBB, NP   1 year ago Encounter for immunization   [place] [facility] Celestia Rosaline SQUIBB, NP   1 year ago Adjustment disorder with mixed anxiety and depressed mood    [facility] Celestia Rosaline SQUIBB, NP               atorvastatin  (LIPITOR) 20 MG tablet 90 tablet 1    Sig: Take 1 tablet (20 mg total) by mouth daily.     Cardiovascular:  Antilipid - Statins Failed - 10/12/2024  2:47 PM      Failed - Lipid Panel in normal range within the last 12 months    Cholesterol, Total  Date Value Ref Range Status  01/12/2024 227 (H) 100 - 199 mg/dL Final   Cholesterol  Date Value Ref Range Status  03/28/2024 74 0 - 200 mg/dL Final   LDL Chol Calc (NIH)  Date Value Ref Range Status  01/12/2024 161 (H) 0 - 99 mg/dL Final   LDL Cholesterol  Date Value Ref Range Status  03/28/2024 37 0 - 99 mg/dL Final    Comment:           Total Cholesterol/HDL:CHD Risk Coronary Heart Disease Risk Table                     Men   Women  1/2 Average Risk   3.4   3.3  Average Risk       5.0   4.4  2 X Average Risk   9.6   7.1  3 X Average Risk  23.4   11.0        Use the calculated Patient Ratio above and the CHD Risk Table to determine the patient's CHD Risk.        ATP III CLASSIFICATION (LDL):  <100     mg/dL   Optimal  899-870   mg/dL   Near or Above                    Optimal  130-159  mg/dL   Borderline  839-810  mg/dL   High  >809     mg/dL   Very High Performed at [facility], [address], [place], [place] [address]    HDL  Date Value Ref Range Status  03/28/2024 29 (L) >40 mg/dL Final  95/75/7974 36 (L) >39 mg/dL Final   Triglycerides  Date Value Ref Range Status  03/28/2024 42 <150 mg/dL Final         Passed - Patient is not pregnant      Passed - Valid encounter within last 12 months    Recent Outpatient Visits  4 months ago Estrogen deficiency   [place] [facility] Celestia Rosaline SQUIBB, NP   5 months ago Hypotension due to drugs   [place] [facility] Celestia Rosaline SQUIBB, NP   9 months ago Encounter for immunization   [place] [facility] Celestia Rosaline SQUIBB, NP   1 year ago Encounter for immunization   [place] [facility] Celestia Rosaline SQUIBB, NP   1 year ago Adjustment disorder with mixed anxiety and depressed mood   [place] [facility] Celestia Rosaline SQUIBB, NP               hydrochlorothiazide  (HYDRODIURIL ) 25 MG tablet 90 tablet 0    Sig: Take 1 tablet (25 mg total) by mouth daily.     Cardiovascular: Diuretics - Thiazide Failed - 10/12/2024  2:47 PM      Failed - Cr in normal range and within 180 days    Creatinine, Ser  Date Value Ref Range Status  03/29/2024 0.51 0.44 - 1.00 mg/dL Final         Failed - K in normal range and within 180 days    Potassium  Date Value Ref Range Status  03/29/2024 2.9 (L) 3.5 - 5.1 mmol/L Final         Failed - Na in normal range and within 180 days    Sodium  Date Value Ref Range Status  03/29/2024 137 135 - 145 mmol/L Final  01/12/2024 140 134 - 144 mmol/L Final         Passed - Last BP in normal range    BP Readings from Last 1 Encounters:  10/09/24 110/71         Passed - Valid encounter within last 6 months     Recent Outpatient Visits           4 months ago Estrogen deficiency   [place] [facility] Celestia Rosaline SQUIBB, NP   5 months ago Hypotension due to drugs   [place] [facility] Celestia Rosaline SQUIBB, NP   9 months ago Encounter for immunization   [place] [facility] Celestia Rosaline SQUIBB, NP   1 year ago Encounter for immunization   [place] [facility] Celestia Rosaline SQUIBB, NP   1 year ago Adjustment disorder with mixed anxiety and depressed mood   [place] [facility] Celestia Rosaline SQUIBB, NP

## 2024-10-15 ENCOUNTER — Other Ambulatory Visit: Payer: Self-pay

## 2024-11-07 ENCOUNTER — Ambulatory Visit: Admitting: Podiatry

## 2024-12-26 ENCOUNTER — Ambulatory Visit (HOSPITAL_COMMUNITY)

## 2024-12-26 ENCOUNTER — Ambulatory Visit

## 2025-04-23 ENCOUNTER — Ambulatory Visit
